# Patient Record
Sex: Female | Born: 1950 | Race: White | Hispanic: No | Marital: Married | Smoking: Never smoker
Health system: Southern US, Community
[De-identification: ages and names within clinical notes are randomized; demographics above are authoritative.]

## PROBLEM LIST (undated history)

## (undated) DIAGNOSIS — R7303 Prediabetes: Secondary | ICD-10-CM

## (undated) DIAGNOSIS — E669 Obesity, unspecified: Secondary | ICD-10-CM

## (undated) DIAGNOSIS — E785 Hyperlipidemia, unspecified: Secondary | ICD-10-CM

## (undated) DIAGNOSIS — F418 Other specified anxiety disorders: Secondary | ICD-10-CM

## (undated) DIAGNOSIS — R251 Tremor, unspecified: Secondary | ICD-10-CM

## (undated) DIAGNOSIS — D649 Anemia, unspecified: Secondary | ICD-10-CM

## (undated) DIAGNOSIS — K439 Ventral hernia without obstruction or gangrene: Secondary | ICD-10-CM

## (undated) DIAGNOSIS — E119 Type 2 diabetes mellitus without complications: Secondary | ICD-10-CM

## (undated) DIAGNOSIS — K219 Gastro-esophageal reflux disease without esophagitis: Secondary | ICD-10-CM

## (undated) DIAGNOSIS — E559 Vitamin D deficiency, unspecified: Secondary | ICD-10-CM

## (undated) DIAGNOSIS — E039 Hypothyroidism, unspecified: Secondary | ICD-10-CM

## (undated) DIAGNOSIS — M1990 Unspecified osteoarthritis, unspecified site: Secondary | ICD-10-CM

## (undated) DIAGNOSIS — R160 Hepatomegaly, not elsewhere classified: Secondary | ICD-10-CM

## (undated) DIAGNOSIS — K5790 Diverticulosis of intestine, part unspecified, without perforation or abscess without bleeding: Secondary | ICD-10-CM

## (undated) DIAGNOSIS — J398 Other specified diseases of upper respiratory tract: Secondary | ICD-10-CM

## (undated) DIAGNOSIS — I679 Cerebrovascular disease, unspecified: Secondary | ICD-10-CM

## (undated) DIAGNOSIS — R32 Unspecified urinary incontinence: Secondary | ICD-10-CM

## (undated) DIAGNOSIS — D131 Benign neoplasm of stomach: Secondary | ICD-10-CM

## (undated) DIAGNOSIS — I10 Essential (primary) hypertension: Secondary | ICD-10-CM

## (undated) DIAGNOSIS — K449 Diaphragmatic hernia without obstruction or gangrene: Secondary | ICD-10-CM

## (undated) DIAGNOSIS — K2270 Barrett's esophagus without dysplasia: Secondary | ICD-10-CM

## (undated) HISTORY — DX: Hyperlipidemia, unspecified: E78.5

## (undated) HISTORY — DX: Diaphragmatic hernia without obstruction or gangrene: K44.9

## (undated) HISTORY — DX: Other specified diseases of upper respiratory tract: J39.8

## (undated) HISTORY — DX: Tremor, unspecified: R25.1

## (undated) HISTORY — DX: Diverticulosis of intestine, part unspecified, without perforation or abscess without bleeding: K57.90

## (undated) HISTORY — DX: Hepatomegaly, not elsewhere classified: R16.0

## (undated) HISTORY — DX: Gastro-esophageal reflux disease without esophagitis: K21.9

## (undated) HISTORY — PX: ESOPHAGOGASTRODUODENOSCOPY: SHX1529

## (undated) HISTORY — PX: APPENDECTOMY: SHX54

## (undated) HISTORY — DX: Type 2 diabetes mellitus without complications: E11.9

## (undated) HISTORY — DX: Ventral hernia without obstruction or gangrene: K43.9

## (undated) HISTORY — DX: Unspecified urinary incontinence: R32

## (undated) HISTORY — PX: ABDOMINAL HYSTERECTOMY: SHX81

## (undated) HISTORY — PX: LAPAROSCOPIC GASTRIC BANDING: SHX1100

## (undated) HISTORY — PX: BREAST REDUCTION SURGERY: SHX8

## (undated) HISTORY — DX: Obesity, unspecified: E66.9

## (undated) HISTORY — PX: REDUCTION MAMMAPLASTY: SUR839

## (undated) HISTORY — DX: Anemia, unspecified: D64.9

## (undated) HISTORY — DX: Other specified anxiety disorders: F41.8

## (undated) HISTORY — PX: TRACHEAL STENOSIS REPAIR W/ PERFUSION AND MLB: SHX2553

## (undated) HISTORY — PX: GALLBLADDER SURGERY: SHX652

## (undated) HISTORY — DX: Vitamin D deficiency, unspecified: E55.9

## (undated) HISTORY — DX: Unspecified osteoarthritis, unspecified site: M19.90

## (undated) HISTORY — DX: Cerebrovascular disease, unspecified: I67.9

## (undated) HISTORY — DX: Essential (primary) hypertension: I10

## (undated) HISTORY — DX: Benign neoplasm of stomach: D13.1

## (undated) HISTORY — PX: ABDOMINAL WALL MESH  REMOVAL: SHX1116

## (undated) HISTORY — DX: Hypothyroidism, unspecified: E03.9

## (undated) HISTORY — DX: Prediabetes: R73.03

## (undated) HISTORY — PX: COLONOSCOPY: SHX174

---
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
## 1998-06-01 ENCOUNTER — Other Ambulatory Visit: Admission: RE | Admit: 1998-06-01 | Discharge: 1998-06-01 | Payer: Self-pay | Admitting: Obstetrics & Gynecology

## 1999-01-21 ENCOUNTER — Encounter: Payer: Self-pay | Admitting: Emergency Medicine

## 1999-01-21 ENCOUNTER — Emergency Department (HOSPITAL_COMMUNITY): Admission: EM | Admit: 1999-01-21 | Discharge: 1999-01-21 | Payer: Self-pay | Admitting: Internal Medicine

## 2000-12-18 ENCOUNTER — Encounter: Admission: RE | Admit: 2000-12-18 | Discharge: 2000-12-18 | Payer: Self-pay | Admitting: Internal Medicine

## 2000-12-18 ENCOUNTER — Encounter: Payer: Self-pay | Admitting: Internal Medicine

## 2001-07-09 ENCOUNTER — Encounter: Payer: Self-pay | Admitting: Internal Medicine

## 2001-07-09 ENCOUNTER — Encounter: Admission: RE | Admit: 2001-07-09 | Discharge: 2001-07-09 | Payer: Self-pay | Admitting: Internal Medicine

## 2001-08-03 ENCOUNTER — Ambulatory Visit (HOSPITAL_BASED_OUTPATIENT_CLINIC_OR_DEPARTMENT_OTHER): Admission: RE | Admit: 2001-08-03 | Discharge: 2001-08-03 | Payer: Self-pay | Admitting: Orthopaedic Surgery

## 2004-03-28 ENCOUNTER — Ambulatory Visit (HOSPITAL_COMMUNITY): Admission: RE | Admit: 2004-03-28 | Discharge: 2004-03-28 | Payer: Self-pay | Admitting: Internal Medicine

## 2005-03-24 ENCOUNTER — Ambulatory Visit (HOSPITAL_COMMUNITY): Admission: RE | Admit: 2005-03-24 | Discharge: 2005-03-24 | Payer: Self-pay | Admitting: Surgery

## 2005-04-24 ENCOUNTER — Encounter: Admission: RE | Admit: 2005-04-24 | Discharge: 2005-07-23 | Payer: Self-pay | Admitting: Surgery

## 2005-04-24 ENCOUNTER — Ambulatory Visit (HOSPITAL_COMMUNITY): Admission: RE | Admit: 2005-04-24 | Discharge: 2005-04-24 | Payer: Self-pay | Admitting: Surgery

## 2005-08-21 ENCOUNTER — Encounter: Admission: RE | Admit: 2005-08-21 | Discharge: 2005-11-19 | Payer: Self-pay | Admitting: Surgery

## 2005-09-09 ENCOUNTER — Encounter (INDEPENDENT_AMBULATORY_CARE_PROVIDER_SITE_OTHER): Payer: Self-pay | Admitting: *Deleted

## 2005-09-09 ENCOUNTER — Inpatient Hospital Stay (HOSPITAL_COMMUNITY): Admission: RE | Admit: 2005-09-09 | Discharge: 2005-09-11 | Payer: Self-pay | Admitting: Surgery

## 2005-09-11 ENCOUNTER — Encounter: Payer: Self-pay | Admitting: Vascular Surgery

## 2005-09-13 ENCOUNTER — Inpatient Hospital Stay (HOSPITAL_COMMUNITY): Admission: EM | Admit: 2005-09-13 | Discharge: 2005-09-19 | Payer: Self-pay | Admitting: Emergency Medicine

## 2005-09-15 ENCOUNTER — Encounter (INDEPENDENT_AMBULATORY_CARE_PROVIDER_SITE_OTHER): Payer: Self-pay | Admitting: *Deleted

## 2005-09-21 ENCOUNTER — Inpatient Hospital Stay (HOSPITAL_COMMUNITY): Admission: EM | Admit: 2005-09-21 | Discharge: 2005-12-12 | Payer: Self-pay | Admitting: Emergency Medicine

## 2005-09-21 ENCOUNTER — Ambulatory Visit: Payer: Self-pay | Admitting: Pulmonary Disease

## 2005-09-22 ENCOUNTER — Ambulatory Visit: Payer: Self-pay | Admitting: Infectious Diseases

## 2005-10-27 ENCOUNTER — Encounter: Payer: Self-pay | Admitting: Vascular Surgery

## 2005-12-24 ENCOUNTER — Ambulatory Visit (HOSPITAL_COMMUNITY): Admission: RE | Admit: 2005-12-24 | Discharge: 2005-12-24 | Payer: Self-pay | Admitting: Otolaryngology

## 2006-01-22 ENCOUNTER — Ambulatory Visit (HOSPITAL_COMMUNITY): Admission: RE | Admit: 2006-01-22 | Discharge: 2006-01-22 | Payer: Self-pay | Admitting: Otolaryngology

## 2006-01-26 ENCOUNTER — Ambulatory Visit (HOSPITAL_COMMUNITY): Admission: RE | Admit: 2006-01-26 | Discharge: 2006-01-26 | Payer: Self-pay | Admitting: Otolaryngology

## 2006-02-27 ENCOUNTER — Ambulatory Visit (HOSPITAL_COMMUNITY): Admission: RE | Admit: 2006-02-27 | Discharge: 2006-02-27 | Payer: Self-pay | Admitting: Otolaryngology

## 2006-04-10 ENCOUNTER — Ambulatory Visit (HOSPITAL_COMMUNITY): Admission: RE | Admit: 2006-04-10 | Discharge: 2006-04-10 | Payer: Self-pay | Admitting: Otolaryngology

## 2006-05-22 ENCOUNTER — Ambulatory Visit (HOSPITAL_COMMUNITY): Admission: RE | Admit: 2006-05-22 | Discharge: 2006-05-22 | Payer: Self-pay | Admitting: Surgery

## 2006-06-19 ENCOUNTER — Ambulatory Visit (HOSPITAL_COMMUNITY): Admission: RE | Admit: 2006-06-19 | Discharge: 2006-06-19 | Payer: Self-pay | Admitting: Otolaryngology

## 2006-08-17 ENCOUNTER — Ambulatory Visit (HOSPITAL_COMMUNITY): Admission: RE | Admit: 2006-08-17 | Discharge: 2006-08-17 | Payer: Self-pay | Admitting: Otolaryngology

## 2006-08-17 ENCOUNTER — Encounter (INDEPENDENT_AMBULATORY_CARE_PROVIDER_SITE_OTHER): Payer: Self-pay | Admitting: Otolaryngology

## 2006-10-02 ENCOUNTER — Ambulatory Visit (HOSPITAL_COMMUNITY): Admission: RE | Admit: 2006-10-02 | Discharge: 2006-10-02 | Payer: Self-pay | Admitting: Otolaryngology

## 2006-12-04 ENCOUNTER — Ambulatory Visit (HOSPITAL_COMMUNITY): Admission: RE | Admit: 2006-12-04 | Discharge: 2006-12-04 | Payer: Self-pay | Admitting: Surgery

## 2007-11-16 ENCOUNTER — Ambulatory Visit (HOSPITAL_COMMUNITY): Admission: RE | Admit: 2007-11-16 | Discharge: 2007-11-16 | Payer: Self-pay | Admitting: Otolaryngology

## 2008-04-04 ENCOUNTER — Ambulatory Visit (HOSPITAL_COMMUNITY): Admission: RE | Admit: 2008-04-04 | Discharge: 2008-04-04 | Payer: Self-pay | Admitting: Internal Medicine

## 2008-04-24 ENCOUNTER — Ambulatory Visit: Payer: Self-pay | Admitting: Gastroenterology

## 2008-05-02 IMAGING — CR DG ABD PORTABLE 1V
2 series · 2 of 2 positions shown · non-contrast
Comparison: none

CLINICAL DATA: Abscess of the anterior abdominal wall.  
 PORTABLE ABDOMEN ? 1 VIEW:

[view not recorded (1 of 2)]
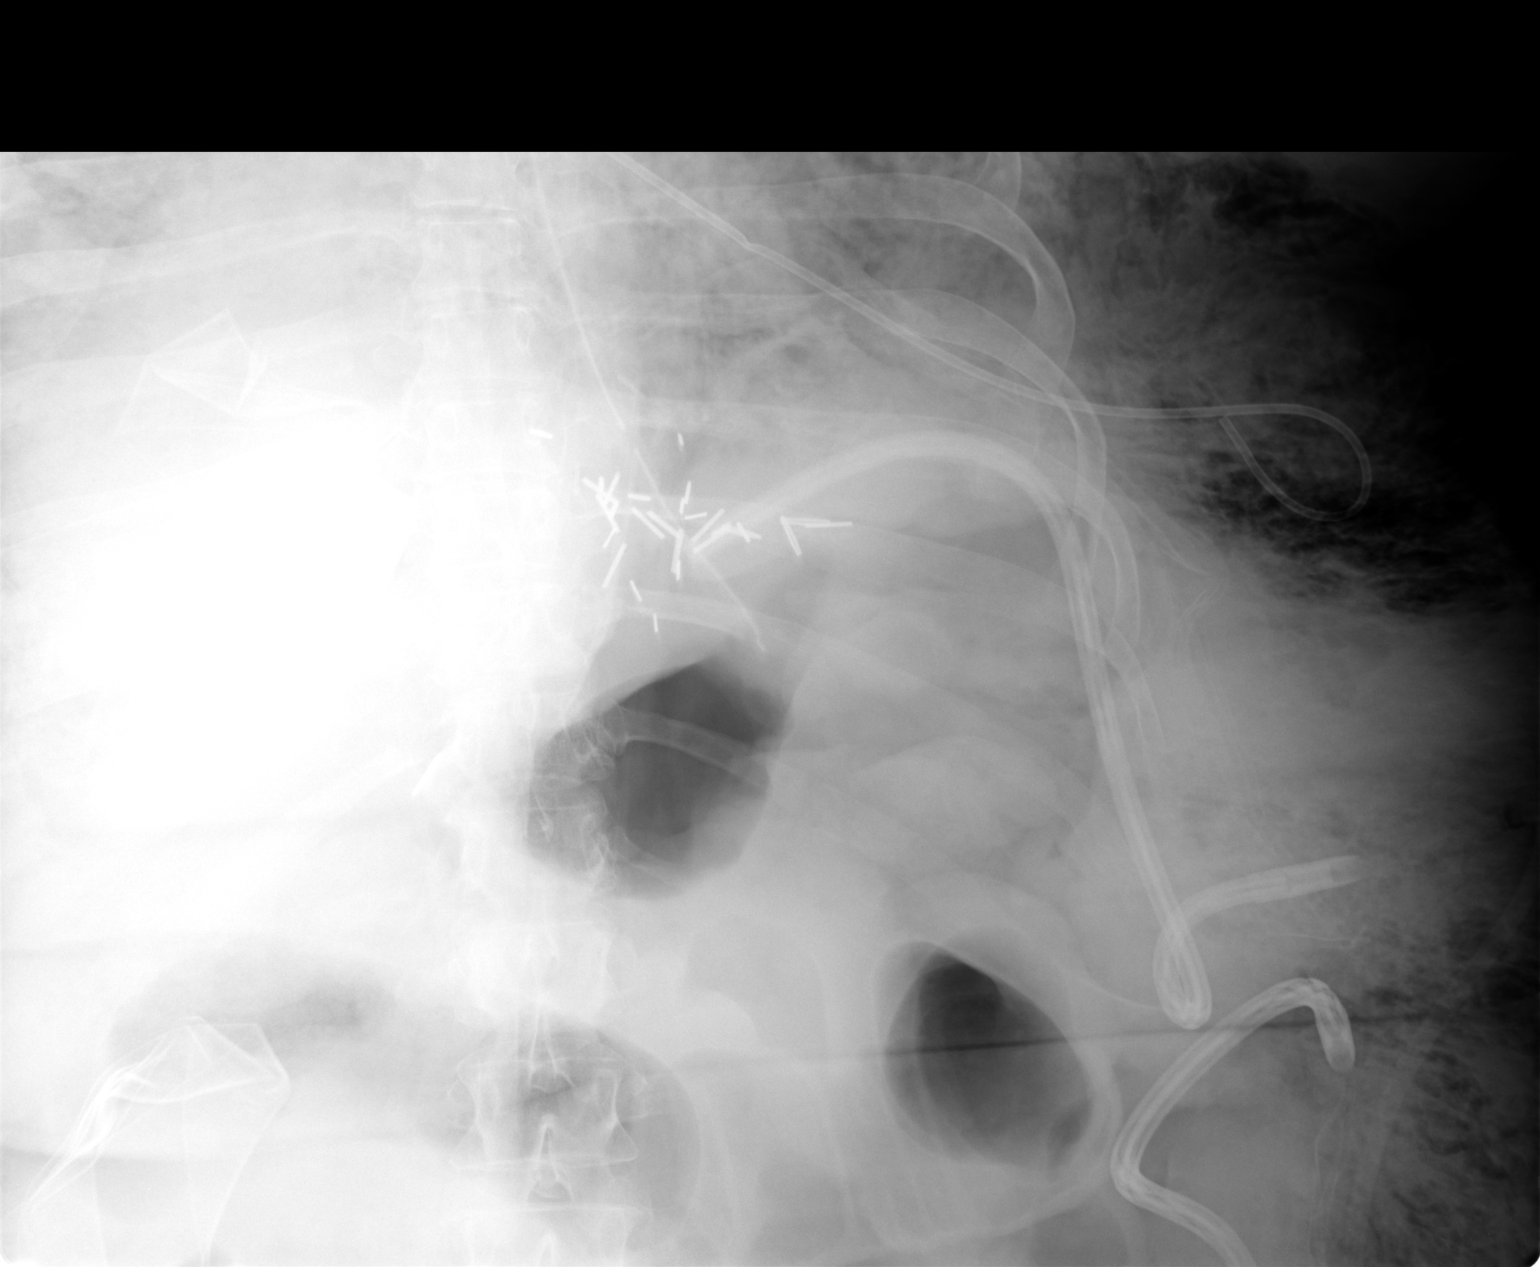

[view not recorded (2 of 2)]
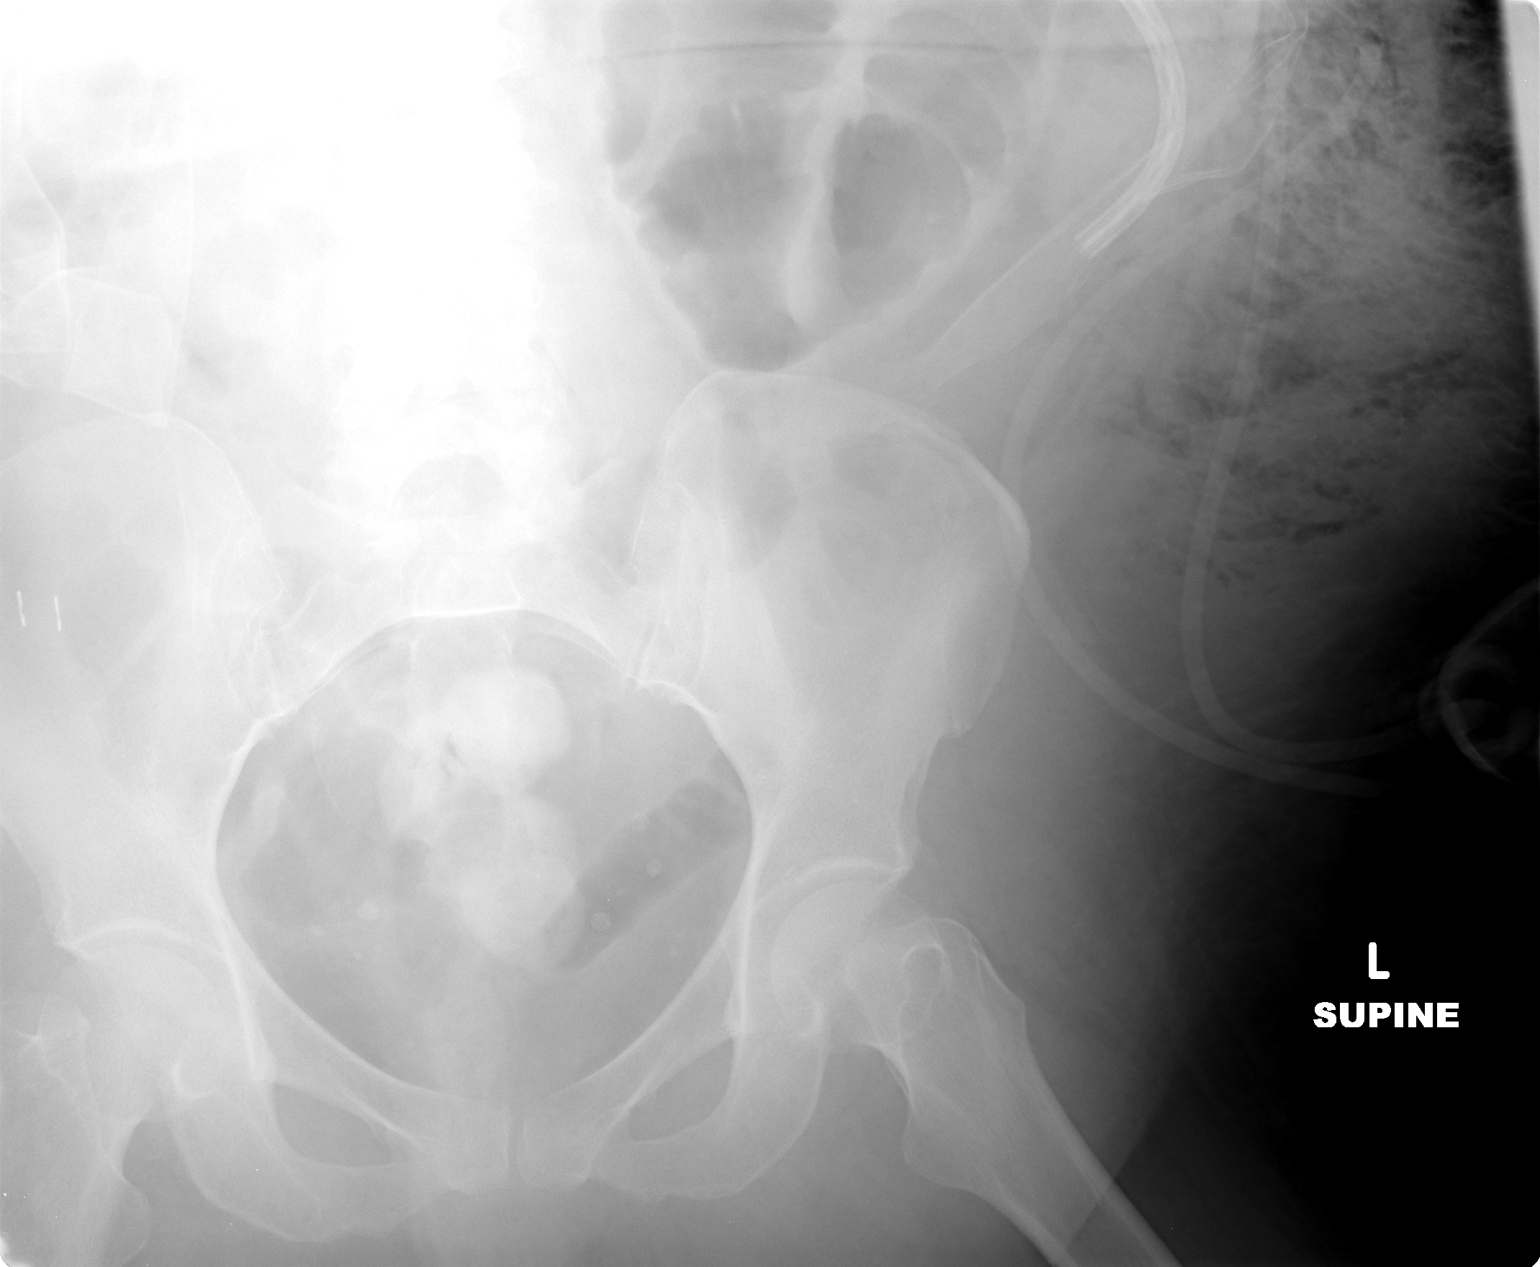

[2 of 2 positions shown; findings below may reference images not displayed]

FINDINGS: Soft tissue drains are seen in the left side of the abdomen.  NG tube tip is barely in the fundus of the stomach.  Side hole is in the distal esophagus. 
 The gas in the soft tissues obscures the detail at the lung bases but the CT scan of the same date showed some slight atelectasis at the right base that appears slightly more prominent.  There is a slight kink in one of the small catheters overlying the chest.
 There is a single slightly distended loop of bowel in the midabdomen on the left.
IMPRESSION: Soft tissue drains in place.  No change in the bowel pattern since the prior study.  Slight increased atelectasis at the right base.

## 2008-05-02 IMAGING — CR DG CHEST 2V
2 series · 2 of 2 positions shown · non-contrast
Comparison: 09/09/2005.

CLINICAL DATA: Status post laparoscopic gastric banding and hernia repair.  
 CHEST - 2 VIEW:

[w chest lat *]
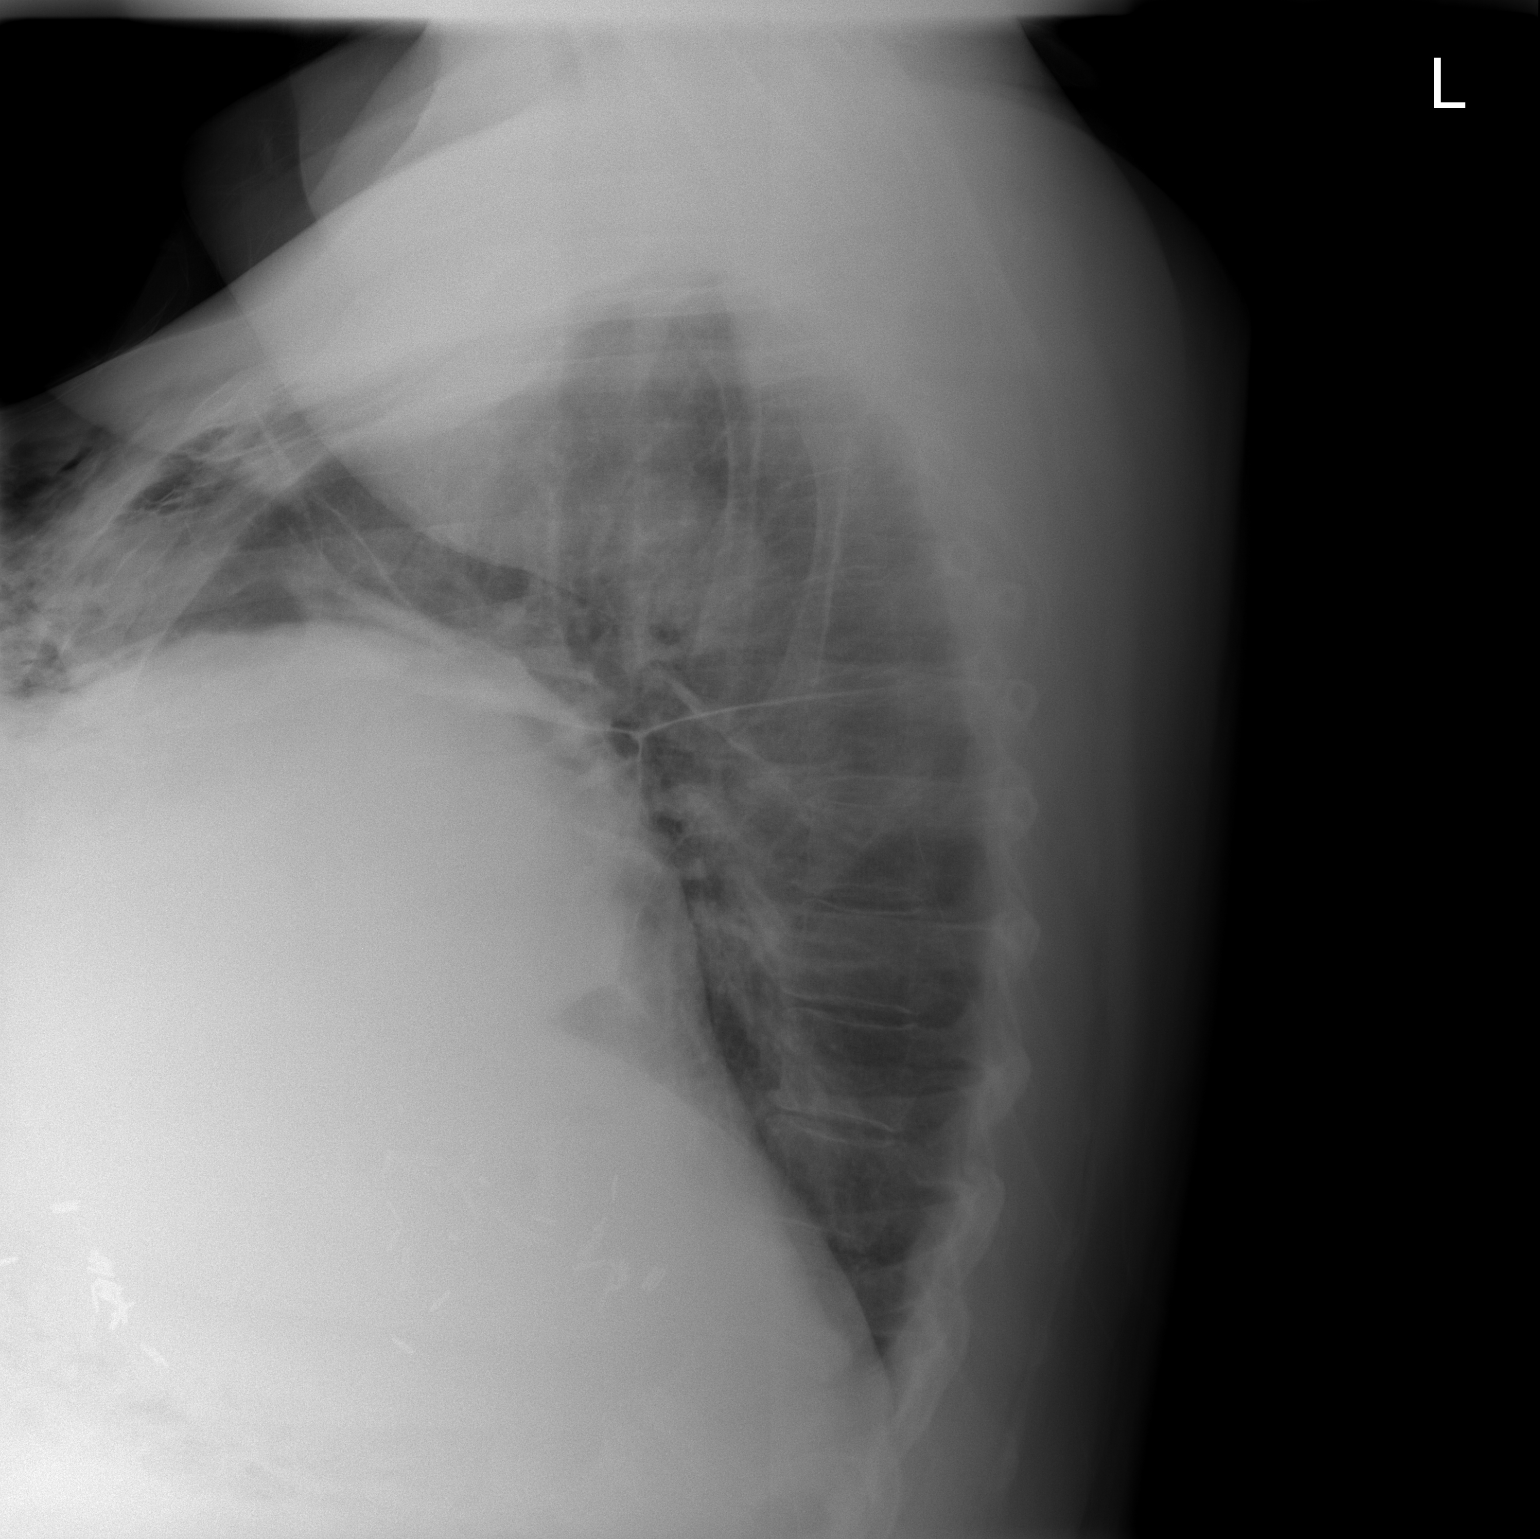

[view not recorded]
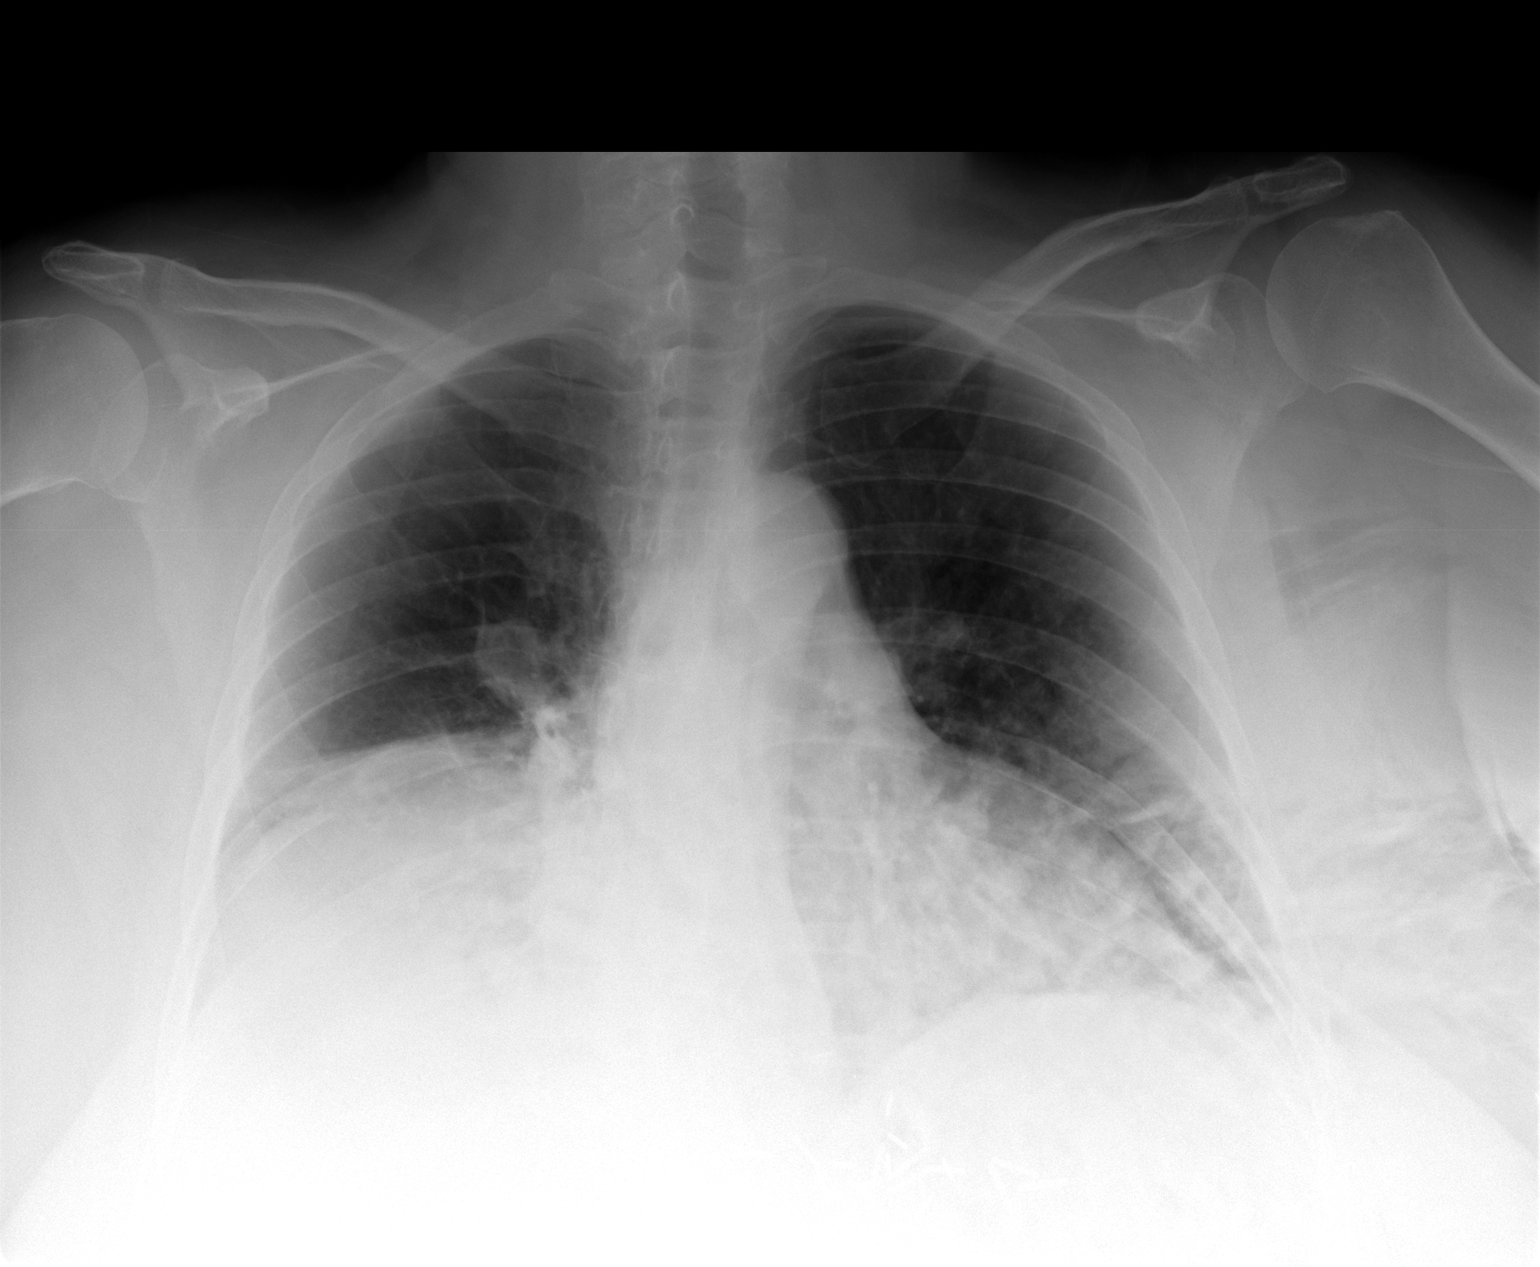

[2 of 2 positions shown; findings below may reference images not displayed]

There is new subcutaneous emphysema in the left anterior lower chest.  There is no evidence of pneumothorax.  Increased right mid and lower lung atelectasis is identified.  Remainder of the lungs are clear.  No evidence of pleural effusions.
IMPRESSION: 1.   New anterior left lower chest subcutaneous emphysema ? question related to recent surgery.  Consider further evaluation with CT.  
 2.  Increased right basilar atelectasis.

## 2008-05-02 IMAGING — CR DG CHEST 1V PORT
1 series · 1 of 1 positions shown · non-contrast
Comparison: Earlier in the day at 1141 hours.

CLINICAL DATA: Line placement. 
 PORTABLE CHEST ? 1 VIEW:

[view not recorded]
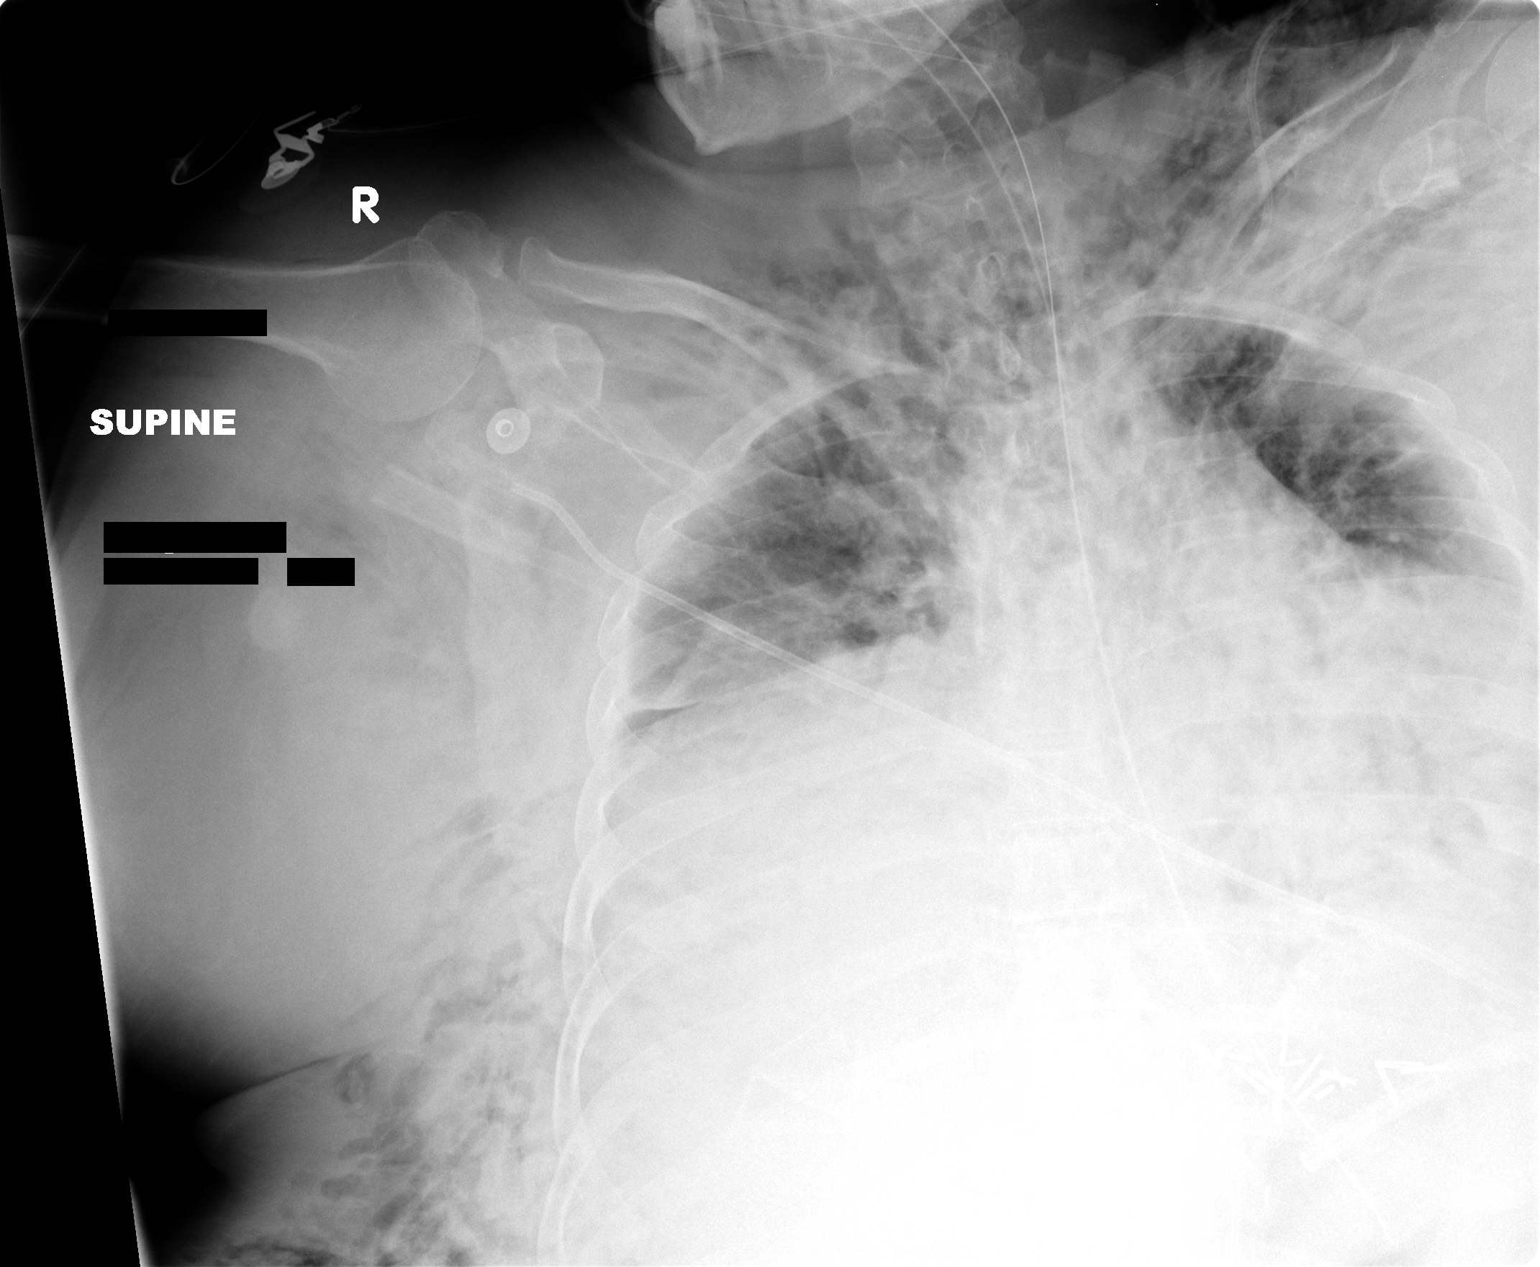

[1 of 1 positions shown; findings below may reference images not displayed]

FINDINGS: The lateral quarter of the left hemithorax is excluded from the film.  There is a left subclavian line, which terminates over the lower SVC just above the superior caval/atrial junction.   There are postsurgical changes about the GE junction.  The heart remains markedly enlarged with markedly diminished lung volumes.  Increased right base atelectasis.  There has been marked increase in subcutaneous air, now throughout the neck and right chest.  There is no definite pneumothorax.  Note that the left costophrenic angle is partially excluded.
IMPRESSION: 1.  The left costophrenic angle is partially excluded from the film. 
 2.  Left-sided subclavian line terminates over the low superior vena cava without definite pneumothorax. 
 3.  Marked progression of subcutaneous emphysema without definit ecause identified. 
 4.  Markedly diminished lung volumes with increased bibasilar atelectasis.

## 2008-05-03 IMAGING — CR DG CHEST 1V PORT
1 series · 1 of 1 positions shown · non-contrast
Comparison: 09/21/05.

CLINICAL DATA: Abscess abdominal wall.  Ventilator.
PORTABLE CHEST - 1 VIEW ? 09/22/05 ? 9959 HOURS:

[view not recorded]
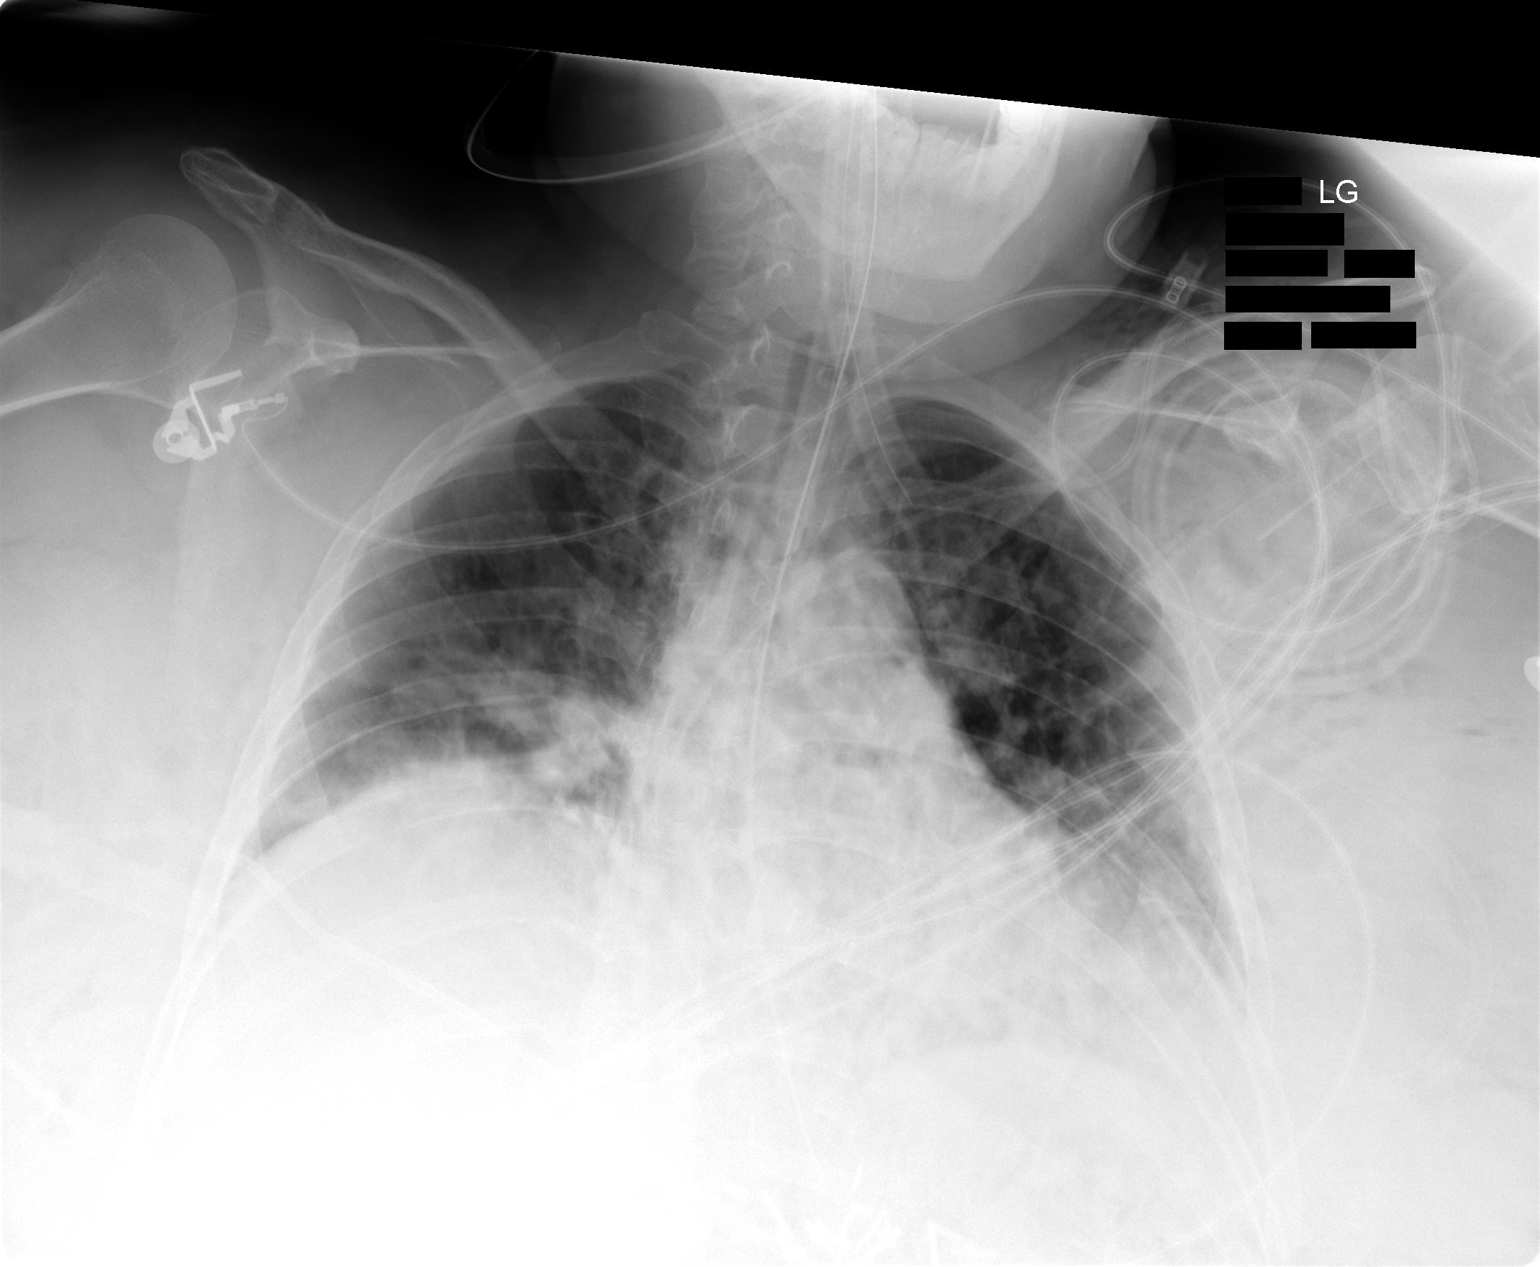

[1 of 1 positions shown; findings below may reference images not displayed]

FINDINGS: Bilateral patchy lung densities predominantly involving the left lung and right base are again noted with possibly some minimal improvement.  Marked decrease in degree of subcutaneous emphysema.  Left subclavian central venous catheter tip is in the SVC.  NG tube is noted traversing the esophagus in the proximal stomach.  Endotracheal tube is in satisfactory position.
IMPRESSION: Bilateral pulmonary densities persist.  Minimal improved aeration.  Improvement in chest wall subcutaneous emphysema.

## 2008-05-10 ENCOUNTER — Telehealth: Payer: Self-pay | Admitting: Gastroenterology

## 2008-05-15 DIAGNOSIS — Q349 Congenital malformation of respiratory system, unspecified: Secondary | ICD-10-CM

## 2008-05-15 DIAGNOSIS — I10 Essential (primary) hypertension: Secondary | ICD-10-CM | POA: Insufficient documentation

## 2008-05-15 DIAGNOSIS — Z8719 Personal history of other diseases of the digestive system: Secondary | ICD-10-CM | POA: Insufficient documentation

## 2008-05-15 DIAGNOSIS — K219 Gastro-esophageal reflux disease without esophagitis: Secondary | ICD-10-CM | POA: Insufficient documentation

## 2008-05-15 DIAGNOSIS — Q318 Other congenital malformations of larynx: Secondary | ICD-10-CM | POA: Insufficient documentation

## 2008-10-25 ENCOUNTER — Emergency Department (HOSPITAL_COMMUNITY): Admission: EM | Admit: 2008-10-25 | Discharge: 2008-10-25 | Payer: Self-pay | Admitting: Emergency Medicine

## 2009-05-09 ENCOUNTER — Encounter: Admission: RE | Admit: 2009-05-09 | Discharge: 2009-05-09 | Payer: Self-pay | Admitting: Internal Medicine

## 2010-03-31 ENCOUNTER — Encounter: Payer: Self-pay | Admitting: Surgery

## 2010-04-05 ENCOUNTER — Other Ambulatory Visit: Payer: Self-pay | Admitting: Internal Medicine

## 2010-04-05 DIAGNOSIS — Z1239 Encounter for other screening for malignant neoplasm of breast: Secondary | ICD-10-CM

## 2010-05-13 ENCOUNTER — Ambulatory Visit
Admission: RE | Admit: 2010-05-13 | Discharge: 2010-05-13 | Disposition: A | Discharge status: Home / Self Care | Payer: Medicare Other | Source: Ambulatory Visit | Attending: Internal Medicine | Admitting: Internal Medicine

## 2010-05-13 DIAGNOSIS — Z1239 Encounter for other screening for malignant neoplasm of breast: Secondary | ICD-10-CM

## 2010-07-23 NOTE — Op Note (Signed)
NAMETESSLYN, BAUMERT                ACCOUNT NO.:  [id number]   MEDICAL RECORD NO.:  [id number]          PATIENT TYPE:  AMB   LOCATION:  SDS                          FACILITY:  [facility]   PHYSICIAN:  Antony Contras, MD     DATE OF BIRTH:  15-Mar-1950   DATE OF PROCEDURE:  10/02/2006  DATE OF DISCHARGE:  10/02/2006                               OPERATIVE REPORT   This 5 mCi 60 bpm detailed operative note on [name] hand the recover  [id number]   DATE OF BIRTH:   PREOPERATIVE DIAGNOSIS:  Tracheal stenosis.   POSTOP DIAGNOSIS:  Tracheal stenosis.   PROCEDURE:  Suspended micro direct laryngoscopy with CO2 laser dilation.   SURGEON:  Antony Contras, MD   ANESTHESIA:  General with jet Venturi ventilation.   COMPLICATIONS:  None.   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  She has undergone multiple dilations, the last being on June 9.  She is  planning to undergo abdominal hernia surgery on August 8 and presents to  the operating room, today, for preoperative dilation.   FINDINGS:  The patient was found to have a 25% stenosis at the previous  tracheostomy site.  The stenosis was dilated to a #42 dilator.   DESCRIPTION OF PROCEDURE:  The patient was identified in the holding  room; and with an informed consent having been obtained, the patient was  moved to the operating suite and put on the operating table in supine  position.  Anesthesia was induced; and the was maintained via mask  ventilation.  The patient was given intravenous steroids during the  case.  The eyes were taped closed; and the bed was turned 90 degrees  from anesthesia.  A tooth guard was placed; and the larynx was then  exposed using a large Jako laryngoscope.  This was placed in suspension  on the Mayo stand.  Then a 0-degree telescope was used to document the  stenosis.  The operating microscope was brought into view  and the CO2  laser on a setting of 4 watts was used to make a single radial cut at 7  o'clock.   The stenosis was then serially dilated from a 30 to a 42 Jackson  dilator.  After this, an LCA was inserted and sprayed.  The larynx was  suctioned and the laryngoscope was removed.  During laryngoscopy, the  patient was ventilated with a jet Venturi ventilator.  The laryngoscope  was taken out of suspension and removed from the patient's mouth.  The  tooth guard was removed; and the patient was turned back to Anesthesia  for wake-up.   Upon removing the laryngoscope, a photograph was made of the  postoperative stenosis.  The patient was then awoken and moved to the  recovery room in stable condition.      Antony Contras, MD  Electronically Signed     DDB/MEDQ  D:  10/02/2006  T:  10/02/2006  Job:  [id number]

## 2010-07-23 NOTE — Op Note (Signed)
[name], [name]                ACCOUNT NO.:  [id number]   MEDICAL RECORD NO.:  [id number]          PATIENT TYPE:  AMB   LOCATION:  SDS                          FACILITY:  [facility]   PHYSICIAN:  Antony Contras, MD     DATE OF BIRTH:  1950-10-29   DATE OF PROCEDURE:  08/17/2006  DATE OF DISCHARGE:                               OPERATIVE REPORT   PREOPERATIVE DIAGNOSIS:  Tracheal stenosis.   POSTOPERATIVE DIAGNOSIS:  Tracheal stenosis.   PROCEDURE:  Suspended micro direct laryngoscopy with subsequent carbon  dioxide laser dilation of the trachea and Kenalog injection.   SURGEON:  Antony Contras, MD   ANESTHESIA:  General endotracheal anesthesia.   COMPLICATIONS:  None.   INDICATIONS:  The patient is a 60 year old white female who has acquired  tracheal stenosis following complications from an abdominal surgery last  summer.  She has undergone multiple dilations in order to maintain  airway patency, the last being April 11.  She has had increasing  symptoms of airway obstruction and presents to the operating room for  surgical management.   FINDINGS:  The trachea is narrowed at the previous tracheostomy level to  approximately a 40% stenosis.  In addition, there are 2 granulomas at  that level with the larger one on the left side that obstructs the  airway 50% more than that.  The patient was dilated up to a #42 dilator.   DESCRIPTION OF PROCEDURE:  The patient was identified in the holding  room and informed consent having been obtained, the patient was moved to  the operative suite and put on the operating table in supine position.  Anesthesia was induced.  The patient was maintained via mask ventilation  and the bed was turned 90 degrees.  The patient was given intravenous  steroids during the case.  The eyes were taped closed and a tooth guard  was placed.  The larynx was then exposed using the largest Storz  laryngoscope.  The laryngoscope was placed in the supraglottic  position  and was then suspended on the Mayo stand with a Lewy arm.  A 0-degree  telescope was used to take a preoperative photograph.  Eye pads and a  damp towel placed for laser protection.  Jet Venturi ventilation was  initiated.  An epinephrine-soaked pledget was then held against the  stenosis while ventilation was being held.  The pledget was then removed  and jet ventilation resumed.  The granulomas were then removed using  upbiting cup forceps holding ventilation.  These were sent to nursing  for pathology.  Epinephrine was again held against the site and the  airway suctioned.  Jet ventilation was resumed.  The stenosis was then  injected with 0.4 mL of Kenalog 40 using a laryngeal needle and  injecting in 4 different sites.  The airway was suctioned and jet  ventilation resumed.  Radial cuts were then made at 12 o'clock, 4  o'clock, and 8 o'clock on the tracheal wall using the CO2 laser on a  setting of 4 watts.  An epinephrine pledget was then rubbed against  the  sites.  The trachea was then dilated starting at a #30 and serially  dilating up to a 42 without difficulty.  The airway was then suctioned  and a 0 degree telescope again used take a postoperative photograph.  An  LTA was then inserted and injected into the airway.  The  laryngoscope was then taken out of suspension and removed from the  patient's mouth, suctioning the airway.  The tooth guard was removed and  the patient was returned to anesthesia under mask ventilation.  The  patient tolerated the procedure well without difficulties and was then  moved to the recovery room in stable condition.      Antony Contras, MD  Electronically Signed     DDB/MEDQ  D:  08/17/2006  T:  08/17/2006  Job:  [id number]

## 2010-07-23 NOTE — Op Note (Signed)
[name], [name]                ACCOUNT NO.:  [id number]   MEDICAL RECORD NO.:  [id number]          PATIENT TYPE:  AMB   LOCATION:  SDS                          FACILITY:  [facility]   PHYSICIAN:  Antony Contras, MD     DATE OF BIRTH:  27-Apr-1950   DATE OF PROCEDURE:  11/16/2007  DATE OF DISCHARGE:                               OPERATIVE REPORT   PREOPERATIVE DIAGNOSIS:  Tracheal stenosis.   POSTOPERATIVE DIAGNOSIS:  Tracheal stenosis.   PROCEDURE:  Micro-direct laryngoscopy with CO2 laser dilation.   SURGEON:  Antony Contras, MD   ANESTHESIA:  General with Jet Venturi ventilation.   COMPLICATIONS:  None.   INDICATIONS:  The patient is a 60 year old white female with tracheal  stenosis, which followed a complicated course after abdominal surgery.  She has undergone multiple dilations, the last being in July 2008.  She  presents to the operative room with two months of gradually increasing  obstructive symptoms.   FINDINGS:  The patient was found to have a 25% stenosis at the previous  tracheostomy site.  This stenosis was dilated to a #42 dilator.   DESCRIPTION OF PROCEDURE:  The patient was identified in the holding  room and informed consent having been obtained from the family, the  patient was moved to operative suite and put on the operating room in  supine position.  Anesthesia was induced and the patient was maintained  via mask inhalation.  The patient was given intravenous steroids during  the case.  The eyes were taped closed and the bed was turned 90 degrees  from anesthesia.  Damp eye pads were placed over the eyes, and a tooth  guard was placed.   The larynx was then exposed using a large Parson's laryngoscope.  It was  placed in a suspension on a Mayo stand.  The Jet Venturi system was  attached, and the patient successfully Jet ventilated. A 0-degree  telescope was used to make a photograph preoperatively.  An epinephrine  pledget was held against the stenosis  while Jet was held.  The pledget  was removed, and Jet Venturi ventilation was reinitiated.  The operating  microscope was then brought into view, and the CO2 laser on the setting  of 4 watts was then used to make a single radial cut at the 7 o'clock  position.  This stenosis was then serially dilated from a 30-42 Jackson  dilator.   After this, 2% lidocaine was sprayed on to the vocal cords.  The  laryngoscope was then taken out of suspension and raised from the  patient's mouth while the airway was suctioned.  The tooth guard was  removed, and the patient was returned back to anesthesia for wake up  under mask ventilation.  During lasering, a damp towel was placed over  the face.  The patient was then awoken and moved to the recovery room in  stable condition.      Antony Contras, MD  Electronically Signed     DDB/MEDQ  D:  11/16/2007  T:  11/16/2007  Job:  [id number]

## 2010-07-26 NOTE — Op Note (Signed)
[name], [name]                ACCOUNT NO.:  [id number]   MEDICAL RECORD NO.:  [id number]          PATIENT TYPE:  INP   LOCATION:  0158                         FACILITY:  [facility]   PHYSICIAN:  Thornton Park. Daphine Deutscher, MD  DATE OF BIRTH:  1950-12-21   DATE OF PROCEDURE:  10/21/2005  DATE OF DISCHARGE:  12/12/2005                               OPERATIVE REPORT   PROCEDURE:  Change abdominal wound VAC, application of Mepitel over  standard VAC.   SURGEON:  Thornton Park. Daphine Deutscher, MD   ASSISTANT:  Alfonse Ras, MD   ANESTHESIA:  General by trache.   DESCRIPTION OF PROCEDURE:  The patient was moved to room 3 where the  previous VAC was taken off.  The pulsatile lavage used to wash abdominal  wound.  Mepitel was applied over the wound followed by standard VAC  dressing.  This was connected to suction and she was then returned back  to intensive care unit.      Thornton Park Daphine Deutscher, MD  Electronically Signed     MBM/MEDQ  D:  03/16/2006  T:  03/17/2006  Job:  [id number]

## 2010-07-26 NOTE — Op Note (Signed)
[name], [name]                ACCOUNT NO.:  [id number]   MEDICAL RECORD NO.:  [id number]           PATIENT TYPE:   LOCATION:                                 FACILITY:   PHYSICIAN:  Thornton Park. Daphine Deutscher, MD       DATE OF BIRTH:   DATE OF PROCEDURE:  09/26/2005  DATE OF DISCHARGE:                                 OPERATIVE REPORT   PROCEDURE:  Removal of Wound VAC, wound irrigation and debridement,  replacement Wound VAC.   SURGEON:  Thornton Park. Daphine Deutscher, M.D.   ASSISTANT:  Baruch Merl, M.D.  General endotracheal that is already in place.   DESCRIPTION OF PROCEDURE:  The patient was taken from the intensive care  unit and brought over.  The Iodoform dressing was removed along with the  other dressings that held the Wound VAC in place.  Minimal fibrinous exudate  was noted on the bowel loops which were exposed.  I was able to irrigate  around the perimeter which was did not have really any purulence.  There was  a little bit of necrotic material around the skin edges which I debrided  sharply.  I used the pulsatile lavage to irrigate not only the anterior  abdominal contents but the pockets on the left and right sides of the  abdomen.  With this was completed, I packed all these areas with gray sponge  material and I put a fenestrated dressing on top of the bowel and then  covered it with two elliptical pieces where I had actually removed the outer  perimeter of each piece.  This marked an improvement from three days ago in  that there has been wound contraction.  This was held in place while I  cleaned around the wound with alcohol and then affixed it with an Iodoform  drape.  I cut a little hole in the top and then put the suction device on  top and this was held with one of the Wound VAC adhesive drapes, as well.  The suction was applied and a good seal was present and good shrinkage of  the dressing occurred.  The patient seemed to tolerate the procedure well  and was taken back  to the intensive care unit.      Thornton Park Daphine Deutscher, MD  Electronically Signed     MBM/MEDQ  D:  09/26/2005  T:  09/26/2005  Job:  [id number]

## 2010-07-26 NOTE — Op Note (Signed)
[name], [name]                ACCOUNT NO.:  [id number]   MEDICAL RECORD NO.:  [id number]          PATIENT TYPE:  AMB   LOCATION:  SDS                          FACILITY:  [facility]   PHYSICIAN:  Antony Contras, MD     DATE OF BIRTH:  [birth year]   DATE OF PROCEDURE:  01/26/2006  DATE OF DISCHARGE:                               OPERATIVE REPORT   PREOPERATIVE DIAGNOSIS:  Tracheal stenosis.   POSTOP DIAGNOSIS:  Tracheal stenosis.   PROCEDURE:  1. Suspended micro direct laryngoscopy with CO2 laser subsequent      dilation.  2. Kenalog injection to tracheal stenosis.   SURGEON:  Dr. Christia Reading   ANESTHESIA:  General endotracheal anesthesia.   COMPLICATIONS:  None.   INDICATIONS:  The patient is 60 year old white female who has acquired  tracheal stenosis following a complicated course related to abdominal  surgery.  A tracheostomy was replaced when stenosis was diagnosed and  she remains tracheostomy dependent at this time.  She underwent dilation  a few weeks ago but experienced no significant improvement in ability to  make a voice around trache tube.  She comes back today for repeat  dilation.   FINDINGS:  The stenotic portion of the trachea was slightly less  stenotic today than the last visit however, she had granulation tissue  filling the trachea at the site overlying the tracheostomy tube.  After  removing the granulation, the more mature scar was dilated with radial  laser incisions and dilators.  Kenalog was injected into the scarred  region.  Her tracheostomy tube was exchanged for a fenestrated  #4 trache tube which provided excellent air leak both with the  fenestration closed and with fenestration open.   DESCRIPTION OF PROCEDURE:  The patient is identified in holding room and  informed consent having been obtained, the patient was moved to the  operating room and placed on operating table in supine position.  Anesthesia was induced and the circuit was hooked  to the tracheostomy  tube.  The eyes were taped closed and the bed was turned 90 degrees from  Anesthesia.  The patient given intravenous steroids during the case.  Pads were then taped over the eyes.  A tooth guard was placed and the  larynx was then exposed using a Storz laryngoscope that was large enough  to handle 42 dilator.  This was difficult to gain exposure with her  small mouth opening but it was ultimately placed in a glottic position.  The laryngoscope was then placed in suspension using a Lewy arm on the  patient's abdomen with towels piled on the abdomen.  An epinephrine  soaked pledget was then held against the area of stenosis for a couple  of minutes and then removed.  The granulation tissue was then removed  using cup forceps exposing the tracheostomy tube.  At this point, the  tracheostomy tube was removed and a jet Venturi system was then  initiated and jet ventilation was used.  The airway was suctioned.  A  bit of granulation still present toward the right side was then  vaporized with the CO2 laser on setting of 4 watts continuous with a  defocused beam.  Beam was then focused and used to make radial cuts at 6  o'clock, 9 o'clock, 12 o'clock, and 3 o'clock.  An epinephrine soaked  pledget was then used to wipe char away from the cuts.  Dilators were  then passed through the trachea starting at a 33 and serially dilating  up to 42.  After this, Kenalog was injected into the stenotic tissue  totaling approximately 1 mL.  The airway was then suctioned out and the  42 dilator again passed through the trachea.  At this point, the  fenestrated #4 trache tube was inserted and the jet Venturi stopped.  A  small bit of granulation was then removed at the 12 o'clock position  using the CO2 laser with a pledget blocking the tube.  This was done  after the FIO2 was dropped below 40%.  To finish this further, the  tracheostomy tube was removed and the laser further used to ablate  the  tissue.  The tracheostomy tube was replaced this time with a non  fenestrated inner cannula and the anesthesia circuit hooked back up.  The patient was then ventilated still with a leak around the tube.  A  Velcro trache tie was then placed.  Laryngoscope was then taken out of  suspension and the larynx sprayed with lidocaine.  As the laryngoscope  was being removed, suction was used to remove secretions.  The tooth  guard was removed.  The patient is turned back to Anesthesia for wake-  up.  Of note, during laser use, the wet towel was placed on the  patient's face covering it completely.  At this point, the patient  returned Anesthesia for wake-up was removed to recovery room in stable  condition.      Antony Contras, MD  Electronically Signed     DDB/MEDQ  D:  01/26/2006  T:  01/27/2006  Job:  [id number]

## 2010-07-26 NOTE — Op Note (Signed)
[name], [name]                ACCOUNT NO.:  [id number]   MEDICAL RECORD NO.:  [id number]          PATIENT TYPE:  AMB   LOCATION:  SDS                          FACILITY:  [facility]   PHYSICIAN:  Antony Contras, MD     DATE OF BIRTH:  05/26/1950   DATE OF PROCEDURE:  02/27/2006  DATE OF DISCHARGE:  02/27/2006                               OPERATIVE REPORT   PREOPERATIVE DIAGNOSIS:  Tracheal stenosis.   POSTOPERATIVE DIAGNOSIS:  Tracheal stenosis.   PROCEDURE:  Suspended micro-direct laryngoscopy with CO2 laser dilation  and Kenalog injection.   SURGEON:  Excell Seltzer. Jenne Pane, M.D.   ANESTHESIA:  General jet Venturi anesthesia.   COMPLICATIONS:  None.   INDICATIONS:  The patient is a 60 year old white female with a  complicated abdominal surgery history several months ago requiring  tracheostomy.  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  Her  tracheostomy was decannulated since her last dilation and she has had  gradual increasing in symptoms of difficulty breathing and stridor.  She  presents to the operating room for surgical management.   FINDINGS:  There is a circumferential stenosis of the trachea about at  the site of the previous tracheostomy placement that is approximately  80% stenotic.  Radial cuts were made at 4 o'clock and 9 o'clock using  the CO2 laser and serial dilation was performed up to have 42 dilator.   DESCRIPTION OF PROCEDURE:  The patient was identified in the holding  room and informed consent having been obtained, the patient was moved to  the operating suite and placed on the operating table in the supine  position.  Anesthesia was induced and the patient was maintained via  mask ventilation.  The patient was given intravenous steroids during the  case and the eyes were taped closed.  The bed was turned 90 degrees from  anesthesia under mask.  A tooth guard was placed and  the larynx was then  exposed using the largest Storz laryngoscope.  The exposure was mildly  difficult.  Once in a glottic position, the jet Venturi system was  attached and the patient was initiated on jet ventilation.  The  laryngoscope was placed in suspension using a Lewy arm on a Mayo stand.  A 0 degrees telescope was used take a preoperative photograph of the  stenosis.  An epinephrine soaked pledget was held against the stenosis  while the jet was being held for about 30 seconds.  The epinephrine was  removed.  Jet ventilation was resumed.  Kenalog was then injected into  the stenosis totaling 0.9 mL and injected into several positions around  the stenosis.  The CO2 laser was then hooked up and using an operating  microscope, radial cuts were made 4 o'clock and 9 o'clock to the wall of  the trachea.  An epinephrine soaked pledget was then rubbed against the  area to remove char.  Jet was held during this process.  Dilators were  then placed starting with the 30 and serially dilating up  to of 42  without difficulty.  The 42 dilator was a bit tight in placement.  After  this, the airway was suctioned and jet Venturi halted.  The laryngoscope  was taken out of suspension and backed up.  An LTA was then injected  into the airway for topical anesthesia.  The laryngoscope was then taken  out of the patient's mouth  suctioning secretions.  The tooth guard was removed and the patient was  returned to mask ventilation without difficulty.  The patient was turned  back to anesthesia for wake-up and moved to the recovery room in stable  condition.  Eye pads and a damp towel were placed during laser use.      Antony Contras, MD  Electronically Signed     DDB/MEDQ  D:  02/27/2006  T:  02/27/2006  Job:  [id number]

## 2010-07-26 NOTE — Discharge Summary (Signed)
NAMEELLYANA, [name]                ACCOUNT NO.:  [id number]   MEDICAL RECORD NO.:  [id number]          PATIENT TYPE:  INP   LOCATION:  0161                         FACILITY:  [facility]   PHYSICIAN:  Thornton Park. Daphine Deutscher, MD  DATE OF BIRTH:  [birth year]   DATE OF ADMISSION:  09/21/2005  DATE OF DISCHARGE:  09/19/2005                                 DISCHARGE SUMMARY   ADMITTING DIAGNOSIS:  Was abdominal pain and gastric distension, possible  bowel obstruction.   COURSE IN HOSPITAL:  [name] was recently discharged after a  laparoscopic adjustable gastric banding at which time in the dissection of  her foregut, she was found to have a giant upper abdominal midline  incisional hernia.  This was unable to be repaired at the time of her lap  banding.  She was admitted by Dr. Derrell Lolling on a Saturday, September 13, 2005.  I was  out of town at a meeting through Monday, and she was admitted, had a CT scan  which showed some dilated loops of bowel in this large hernia defect with  some scattered free air which was secondary to her previous laparoscopy.  She was placed on NG suction.  She was observed with NG tube in place.  I  returned on July 9 and saw her and reviewed her CT scan and discussed taking  her to the operating room, as I was concerned she had incarcerated ventral  hernia.  Laparoscopy and repair of multiple ventral abdominal hernias was  done.  Drains were placed into the pockets that were repaired.  The 7-10  sheath seemed stable, had a pulse rate of 88.  Foley was out.  NG tube was  left in place.  Postop day 2, NG tube was clamped and then removed.[name] Kitchen  Postop  day 4, July 13, pulse rate 96, no complaints but noted to have slightly  increased work of breathing, and her drains had been removed.  She was eager  to go home, but I wanted to check for occult PE, and CT scan of the chest  was obtained.  Bilateral lower extremity Dopplers were also done which  showed no evidence of DVT.  She had 1  episode of diarrhea.  She was  afebrile, and this was postop day #4,, and she was discharged for follow-up  the following week.   FINAL DIAGNOSIS:  Readmission for small bowel obstruction secondary to large  ventral incisional hernia.      Thornton Park Daphine Deutscher, MD  Electronically Signed     MBM/MEDQ  D:  10/21/2005  T:  10/21/2005  Job:  [id number]

## 2010-07-26 NOTE — Op Note (Signed)
NAMEWESTLYNN, FIFER                ACCOUNT NO.:  [id number]   MEDICAL RECORD NO.:  [id number]          PATIENT TYPE:  INP   LOCATION:  0161                         [facility]ACILITY:  [facility]   PHYSICIAN:  Thornton Park. Daphine Deutscher, MD  DATE OF BIRTH:  11/13/1950   DATE OF PROCEDURE:  10/06/2005  DATE OF DISCHARGE:                                 OPERATIVE REPORT   PROCEDURE:  Change of abdominal VAC dressing.   SURGEON:  Thornton Park. Daphine Deutscher, MD.   ASSISTANT:  Alfonse Ras, MD.   ANESTHESIA:  General per trache.   The patient was taken to room one, the old VAC was removed. The abdomen was  prepped with Betadine and draped sterilely.  A pulse lavage unit was used to  irrigate the bowel.  I removed the JP drain that was up and around the  stomach which it has been draining serosanguineous material for some time.  After cleaning with irrigation, the surrounding tissue was pink and viable,  minimal debridement was required.  A new abdominal VAC was then placed,  tucked beneath the fascia.  I did attempt to get the small bowel reduced  down into the pelvis where it had sort of migrated from and I placed the [facility]  and this was adhered to with an Ioban drape and with the usual VAC  adhesives. After it had been connected to the suction, I then created with 2  abdominal binders, a single binder to help reinforce the [facility] and this was  applied.  The patient then taken back to the intensive care unit room 161.      Thornton Park Daphine Deutscher, MD  Electronically Signed     MBM/MEDQ  D:  10/06/2005  T:  10/07/2005  Job:  [id number]

## 2010-07-26 NOTE — Op Note (Signed)
[name], [name]                ACCOUNT NO.:  [id number]   MEDICAL RECORD NO.:  [id number]          PATIENT TYPE:  INP   LOCATION:  0152                         FACILITY:  [facility]   PHYSICIAN:  Thornton Park. Daphine Deutscher, MD  DATE OF BIRTH:  06-08-1950   DATE OF PROCEDURE:  11/17/2005  DATE OF DISCHARGE:                                 OPERATIVE REPORT   PREOPERATIVE DIAGNOSIS:  Suppurative wound drainage from site of previous  column and mesh placement with drainage increased to the VAC.   PROCEDURE:  Is removal of VAC, removal of column and mesh.  A pulsatile  lavage irrigation of the abdominal contents which were well walled off and  inspection.  Limited debridement and replacement of abdominal wound Vac.   SURGEON:  Dr. Daphine Deutscher   ASSISTANT:  Alfonse Ras, M.D.   ANESTHESIA:  General per #6 endotracheal tube.   DESCRIPTION OF PROCEDURE:  The patient was taken back to room one on  September 10 and given general anesthesia.  The abdomen was prepped with  Betadine after removing the previous dressing and Vac.  There was  suppurative purulent material all over the graft and went ahead and opened  it up gently and ended up just removing it by removing all of the previously  placed horizontal mattress sutures of Prolene.  I did not find any tracts or  pockets anywhere and did not immediately discern any fistulae.  I irrigated  with the pulse lavage and then irrigated with double antibiotic solution.   The wound was then redressed with a vac dressing using the abdominal portion  of prefenestrated encapsulated sponge with a second sponge on top fixed with  an Ioban drape.  At the same time we also redid her abdominal wound binder  and creating a large one.  She was then taken to recovery room prior to  transfer to step-down.      Thornton Park Daphine Deutscher, MD  Electronically Signed     MBM/MEDQ  D:  11/17/2005  T:  11/18/2005  Job:  [id number]

## 2010-07-26 NOTE — Op Note (Signed)
[name], [name]                ACCOUNT NO.:  [id number]   MEDICAL RECORD NO.:  [id number]          PATIENT TYPE:  INP   LOCATION:  0158                         FACILITY:  [facility]   PHYSICIAN:  Thornton Park. Daphine Deutscher, MD  DATE OF BIRTH:  Feb 08, 1951   DATE OF PROCEDURE:  10/04/2005  DATE OF DISCHARGE:  12/12/2005                               OPERATIVE REPORT   PROCEDURE:  Change area change of abdominal wound VAC/   SURGEON:  Thornton Park. Daphine Deutscher, MD   ASSISTANT:  Alfonse Ras, MD   ANESTHESIA:  General by tracheostomy.   DESCRIPTION OF PROCEDURE:  Much like many of the other VAC changes, this  was done in the operating room.  The VAC was removed.  The wound was  irrigated with pulsatile lavage and then reattached to a VAC cutting  sponges, laying them in place and then connecting them to appropriate  suction.  Following this, the patient was taken back to the intensive  care unit.  Condition stable      Matthew B. Daphine Deutscher, MD  Electronically Signed     MBM/MEDQ  D:  03/16/2006  T:  03/17/2006  Job:  [id number]

## 2010-07-26 NOTE — Op Note (Signed)
NAMEDAYZEE, TROWER                ACCOUNT NO.:  [id number]   MEDICAL RECORD NO.:  [id number]          PATIENT TYPE:  INP   LOCATION:  0161                         FACILITY:  [facility]   PHYSICIAN:  Thornton Park. Daphine Deutscher, MD  DATE OF BIRTH:  [birth year]   DATE OF PROCEDURE:  10/24/2005  DATE OF DISCHARGE:                                 OPERATIVE REPORT   PROCEDURE:  Change wound VAC.   SURGEON:  Thornton Park. Daphine Deutscher, M.D.   ASSISTANT:  Alfonse Ras, M.D.   DESCRIPTION OF PROCEDURE:  [name] was taken back to room #11 on  Friday, October 24, 2005.  The previously-placed VAC was removed.  I  carefully studied the column and the mesh was in place.  There are a few  more buds in the left upper quadrant of this graft, although there were  certainly not enough in the way of in-growth to sustain any kind of skin  graft at this time.  The area was irrigated with pulsatile lavage and then  redressed with the standard black sponge over the Mepitel which was directly  in contact with the mesh.  In addition, I used some white foam on either  little hole on each side after applying the VAC.  It was put to 125 mmHg  suction and the patient was taken back to her room in the intensive care  unit.      Thornton Park Daphine Deutscher, MD  Electronically Signed     MBM/MEDQ  D:  10/24/2005  T:  10/24/2005  Job:  [id number]

## 2010-07-26 NOTE — Op Note (Signed)
[name], [name]                ACCOUNT NO.:  [id number]   MEDICAL RECORD NO.:  [id number]           PATIENT TYPE:   LOCATION:                                 FACILITY:   PHYSICIAN:  Antony Contras, MD     DATE OF BIRTH:  February 17, 1951   DATE OF PROCEDURE:  12/02/2005  DATE OF DISCHARGE:                               OPERATIVE REPORT   PREOPERATIVE DIAGNOSES:  1. Stridor.  2. Tracheal stenosis.   POSTOPERATIVE DIAGNOSES:  1. Stridor.  2. Tracheal stenosis.   PROCEDURE:  1. Tracheostomy.  2. Direct laryngoscopy.   SURGEON:  Dr. Christia Reading.   ANESTHESIA:  General endotracheal anesthesia.   COMPLICATIONS:  None.   INDICATIONS:  The patient is a 60 year old white female who underwent  gastric bypass surgery a couple of months ago and has had a long  complicated course including an open abdomen requiring dressing changes.  She had to be on mechanical ventilation for a couple of weeks, and  ultimately, tracheostomy was performed by Dr. Daphine Deutscher.  She has since had  been decannulated but since that time has had progressing stridor that  is worse with exertion.  Fiberoptic exam demonstrates a narrowing of the  trachea at the tracheostomy level.  Her tracheostomy wound has fully  healed at this time.  She continues to have an open abdomen at this time  and is being brought back to the operating room both for my procedure as  well as further work on the abdomen by Dr. Daphine Deutscher and Dr. Shelle Iron.   FINDINGS:  Upon direct laryngoscopy, a 75% stenosis of the trachea was  found at the tracheostomy level.  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  Upon revision tracheostomy, there was a good deal of  scarring.   DESCRIPTION OF PROCEDURE:  The patient was identified in holding room,  and informed consent having been obtained, the patient was moved to the  operative suite and put on the operating table in supine position.  Anesthesia was induced, and the patient was maintained via mask  ventilation and turned 90 degrees from anesthesia.  The eyes were taped  closed.  A tooth guard was placed, and a Miller blade laryngoscope was  inserted in the mouth and used to expose the larynx.  The laryngoscope  was placed in a subglottic position.  A 0-degree telescope was then used  to evaluate the upper airway quickly.  After this, a small endotracheal  tube was then placed.  The laryngoscope was removed, and the cuff was  inflated.  The tube was taped, and the bed was turned back to the 0-  degree position.  The anterior neck was then prepped and draped in  sterile fashion.  A horizontal incision was made at the trache site  using Bovie electrocautery.  Tissue was then dissected down to the  trachea, elevating scar tissue.  The trachea was then opened  approximately between rings 2 and 3 using a 15-blade scalpel.  A 2-0  silk suture was then placed around the ring above and below the trache  site.  Endotracheal tube cuff was then deflated and backed up to above  the trache site.  A #6 cuffed Shiley trache tube was then inserted  without difficulty.  The cuff was inflated, and the anesthesia circuit  was hooked to the tracheostomy tube.  The stay sutures were tied with 2  knots above and 1 knot below the trache site.  The trache flange was  then secured to anterior neck skin using 2-0 silk sutures in 4  quadrants.  A trache dressing and trache tie were then applied.  At this  point in time, Dr. Daphine Deutscher and Dr. Shelle Iron performed their portion of the  procedure.  For their details, please see their dictated operative  notes.      Antony Contras, MD  Electronically Signed     DDB/MEDQ  D:  01/23/2006  T:  01/23/2006  Job:  [id number]

## 2010-07-26 NOTE — Op Note (Signed)
NAMEAMONIE, WISSER                ACCOUNT NO.:  [id number]   MEDICAL RECORD NO.:  [id number]          PATIENT TYPE:  INP   LOCATION:  0161                         FACILITY:  [facility]   PHYSICIAN:  Thornton Park. Daphine Deutscher, MD  DATE OF BIRTH:  11-04-1950   DATE OF PROCEDURE:  10/31/2005  DATE OF DISCHARGE:                                 OPERATIVE REPORT   PROCEDURE:  1. Change VAC abdominal wound dressing.  2. Down size Shiley trache from 6 to 4.  3. Change Foley catheter.   SURGEON:  Thornton Park. Daphine Deutscher, M.D.   ASSISTANT:  Alfonse Ras, M.D.   DESCRIPTION OF PROCEDURE:  Ms. Strader was taken back to room 10 at [facility] and given some intravenous sedation but was not paralyzed.  The  abdominal Wound VAC dressing was taken by removing the previous one.  I used  the pulse lavage to irrigate on the column and the mesh.  There were more  buds but still not enough to sustain a skin graft.  Previously placed skin  sutures in the other two areas were moved.  The sponges had all been removed  and was replaced and then the Mepitel was placed down on the graft before  the Wound VAC was changed and anchored in place with an Ioban drape and  taken to 125 mm suction.  Next, the Shiley trache was changed over a plastic  obturator without difficulty and secured.  The nurses then changed the Foley  catheter.  The patient tolerated the procedure well and was taken to the  recovery room in satisfactory condition.      Thornton Park Daphine Deutscher, MD  Electronically Signed     MBM/MEDQ  D:  10/31/2005  T:  10/31/2005  Job:  [id number]

## 2010-07-26 NOTE — Op Note (Signed)
[name]                ACCOUNT NO.:  [id number]   MEDICAL RECORD NO.:  [id number]          PATIENT TYPE:  INP   LOCATION:  0153                         FACILITY:  [facility]   PHYSICIAN:  Thornton Park. Daphine Deutscher, MD  DATE OF BIRTH:  13-Jun-1950   DATE OF PROCEDURE:  09/21/2005  DATE OF DISCHARGE:                                 OPERATIVE REPORT   PREOPERATIVE DIAGNOSIS:  Probable subphrenic abscess with multifloral  organisms from possibly an infected hematoma.   POSTOP DIAGNOSIS:  Left subphrenic abscess with multiflora organisms (gm neg  rods, gm pos rods, gm pos cocci);  perforation at band plication suture  site;  abscess extension through port sites to involve suprafascial planes  in the abdomen and lower chest. Status post exploratory laparotomy, closure  of tiny gastrotomy involving the medial most gastricplication suture;  testing with methelyene blue thru the NG tube; pulse lavage irrigation of  the intraabdominal contents and the abscess cavities; fascial debridement of  the upper abdominal abscess and its extensions along suprafascial planes;  placement of drains; and temporary closure of the abdomen with 3 liter IV  bag sutured to the skin.   SURGEON:  Thornton Park. Daphine Deutscher, M.D.   ASSISTANT:  Alfonse Ras, M.D. and Anselm Pancoast. Zachery Dakins, M.D.   ANESTHESIA:  General endotracheal.   DRAINS:  Multiple 19 Blakes, 1 Shirley sump, and 2 penrose drains.   ESTIMATED BLOOD LOSS:  75 mL   DISPOSITION:  To the intensive care unit with critical care medicine consult  pending.   DESCRIPTION OF PROCEDURE:  [name] is a 60 year old lady who is almost  2 weeks out from a laparoscopic lap band (VG) placement done in the face of  prior and cholecystectomy, open Nissen fundoplication in early 90s.  At the  time of the original surgery she was found to have many adhesions to the  anterior abdominal wall and in fact a very large complex ventral hernia.  This was unable to be  repaired primarily at that time.  The band placement  went well and postop as well looked to be in good position.  No leak was  noted.  The patient was subsequently discharged home.  She returned to the  hospital  with a small bowel obstruction with loops of bowel in this large  multi cavernous ventral hernia. This produced  a lot of proximal gastric  distension.  On July 9, we were able to go in laparoscopically and reduce  this incarcerated bowel and repair these complex defects with allograft mesh  1 week from her initial surgery.  Following this she was decompressed seemed  to get along well and began having bowel movements and was taking a diet and  doing well.  By this past Friday she was eager to go home and then started  back on liquid diet.  She was discharged and had some complaints of diarrhea  yesterday (Saturday) and came to the ER today weaker, having some shortness  of breath but noticed yesterday that the upper portion of her incision had  gotten more swollen and  red and then upon getting out of the car in the  emergency department began discharging fluid from one of her upper trocar  sites.   She was seen in the ED promptly by Dr. Zachery Dakins, and by myself and Dr.  Colin Benton and we drained off approximately 2-3 liters of what appeared to be a  port wine colored liquid that was foul smelling and on gram stain  subsequently was shown to have gram negative rods, gram positive rods, and  gram positive cocci.  CT scan was obtained after a drain had been placed  through this port site and this shows gas dissecting in the anterior  abdominal wall and going up into the chest.  For that reason we took her  straight away to the operating room. Intubation and anesthesia were given.  Dr. Rica Mast attempted a right internal jugular 3 lumen IV without success.  Dr. Daphine Deutscher placed a left subclavian 3 lumen catheter for IV access without  difficulty.   [name]'s abdomen was prepped widely  with Betadine and draped sterilely.  I made an upper midline incision and carried this down into cavity  anteriorly with necrotic fat and some old sutures from her previous Prolene  closure in the 90s.  There was some grayish dead tissue there in the midline  and this tracked back for several centimeters on either side of the fascia.  There was foul odor present.   First order business was to enter the abdomen and carefully take down  adhesions.  In the previously area where I had done the hernia repair and  run the small bowel.  There was no leak and no enterotomy and there was no  leak in the colon or any of small bowel.  However, proximally in the foregut  we had this foul-smelling discharge and I was concerned that she had some  bleeding from spleen and had an infected hematoma.  However, upon going up  into the foregut I found after we had evacuated all the liquid, there was  now some old soilage material coming out.  Exposing the band there was about  2 mm hole in the right side most suture where I had plicated anterior  stomach over the proximal stomach and placed the band.  There were two  little holes there that were oversewn with 2-0 silk sutures.  Area was  irrigated and methylene blue was put into this area under pressure and no  leak was demonstrated.  The band in the meantime was unbuckled and removed.  After this was repaired and tested, Tisseel was placed over that area. The  area was subsequently drained with a 19 Blake drain.   No other enterotomies or any other problems were noted and we then began  debridement of the anterior abdominal wall and debrided on the left side  went up to the underneath the inframammary region up in the chest again  above the fascia, in the right side went up above on top of the chest.  We  went above the right lobe of the liver and irrigated that and then decompressed that and also down toward the right lower quadrant all way down  toward  the anterior superior iliac spine.  All these areas were opened.  We  displayed her most recent CT scan in the OR and used that to guide [place] in  this debridement.  Because of her obesity, palpation of crepitus was  difficult.  Any kind of gray colored necrotic  fatty material was debrided  with sharp dissection.  There was extensive debris was it was removed.  We  knew that because of her extensive big ventral hernia that closure would be  impossible without some form of mesh and in this infected situation this was  not possible.  We elected to create a silo using a 3 liter IV bag that was  sewn to the skin around the perimeter of the wound.  Prior to doing this  however, we put a Shirley sump drain which was the only sump drain that  [facility] had into the upper midline and up going up  coursing up into the left chest above in the inframammary region. JPs were  placed in the left side and also JP placed inside up near the closure of the  gastrotomy.  On the right side large Penrose drains were placed in these  spaces as well.  The patient was then dressed with a wound dressing and was  then taken to the intensive care unit for recovery.  She was given Tygacil,  Diflucan, and penicillin G in the operating room.  The plan is to keep her  intubated and paralyzed with assessment of her anterior abdominal wall and  infection and repeat laparotomy later as needed as we need to either debride  and/or restore the integrity of her abdominal compartment.  Operating time  approximately 3 hours.  She tolerated the anesthesia and procedure well and  was transported to the ICU for recovery.      Thornton Park Daphine Deutscher, MD  Electronically Signed     MBM/MEDQ  D:  09/21/2005  T:  09/22/2005  Job:  [id number]

## 2010-07-26 NOTE — Discharge Summary (Signed)
NAMEYARA, [name]                ACCOUNT NO.:  [id number]   MEDICAL RECORD NO.:  [id number]          PATIENT TYPE:  INP   LOCATION:  0158                         FACILITY:  [facility]   PHYSICIAN:  Thornton Park. Daphine Deutscher, MD  DATE OF BIRTH:  1951/01/21   DATE OF ADMISSION:  09/21/2005  DATE OF DISCHARGE:  12/12/2005                                 DISCHARGE SUMMARY   PREOPERATIVE DIAGNOSIS:  Necrotizing gas forming upper abdominal infection  secondary to a gastric perforation.   HOSPITAL COURSE:  [name] is a 60 year old lady who had a takedown of a  laparoscopic Nissen and banding and also with takedown of a giant abdominal  ventral hernia.  Postoperatively, she had some obstruction from her hernia  being incarcerated in small bowel.  This was managed surgically.  She went  home but came back to the ER short of breath and with foul leakage noted in  the emergency room from her upper trocar port.  In the ED, I cultured this  and it came back gram-positive rods and gram-positive cocci and gram-  negative rods.  We felt that she had a large subphrenic abscess with gas  directing out around the port sites.   She was taken to the operating room on Sunday, September 21, 2005 and an  extensive operation requiring debridement of the anterior abdominal wall,  opening of the wound, removal of the bland, closure of just a pinpoint hole  in the stomach where a suture had torn through and then she underwent  temporary closure of abdominal wall for loss of domain from this giant  abdominal wall hernia.   Her hospital course was long and could be divided into the initial phase of  resuscitation and management of this immediately life-threatening infection  which required about a week to get her under control.  This was done with  every other day wound changes and ultimately with a vac drain on her open  abdomen.  She was kept in the intensive care unit and tracheostomized early  main for the need to  continually go back to the operating room as she  fortunately did not develop ARDS.  She had critical care medicine assistance  and ID assistance to maximize her antibiotic coverage and her intensive care  treatment.   Once the first phase, which lasted about a week was over, we then began  trying to use this vac dressing to clean the wound up which we were able to  do over the remaining portion of the month.  On October 10, 2005, the wound  did get cleaned up enough so that I went in and installed a piece of  CollaMend BioMesh by [employer].  A wound vac was placed over that.  Over the next  month, we took her down to the OR every third day, changing her VAC and  examining this to try to stimulate ingrowth of her own tissue into the  biomesh.  She  was transferred out of the unit and the wound service was  changing this VAC on the floor.  Subsequently, she  developed some difficulty  with breathing and mild septic-type picture and the CollaMend appeared to be  separating from her own tissue, which we thought was growing into and thus  on November 17, 2005 I had to remove the CollaMend mesh and just place a  vac directly over now what appeared to be a granular bed.  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 I was concerned that she might have developed an  enterocutaneous fistula but apparently she had not.  She may have had some  issues with adrenal insufficiency and she was given steroids.  She was  maintained on vancomycin and Tygacil.  Throughout this course, she got TNA.   Final phase of her hospitalization required identification and management of  stridor.  Workup of stridor revealing she had a stenotic area around her  tracheostomy and this was managed by Dr. Jenne Pane; and her open wound  subsequently needed split-thickness skin grafting to her open abdomen was  performed with the assistance of Dr. Shelle Iron.  These were  performed on  December 02, 2005.  She, following this, had a bolus dressing and these  were taken down about five days after the graft, looked at and looked to be  a good take of the graft site with donor sites on her left thigh.  These  cleaned up and she progressed and was ready for discharge on hospital day  #83 which was December 12, 2005.  Her husband [name] did a great job of  supportive hands-on on care as well as emotional support throughout her  hospital stay. She was asked to return to the office in a week.  In addition  to her other home medicines, she was given prescriptions for Zofran ODT 8  and Duragesic patches as well as Ativan 1 mg b.i.d. p.r.n.      Thornton Park Daphine Deutscher, MD  Electronically Signed     MBM/MEDQ  D:  01/14/2006  T:  01/14/2006  Job:  [id number]   cc:   Lucky Cowboy, M.D.  Fax: [phone]

## 2010-07-26 NOTE — Op Note (Signed)
NAMERUTHER, EPHRAIM                ACCOUNT NO.:  [id number]   MEDICAL RECORD NO.:  [id number]          PATIENT TYPE:  INP   LOCATION:  0161                         FACILITY:  [facility]   PHYSICIAN:  Thornton Park. Daphine Deutscher, MD  DATE OF BIRTH:  May 27, 1950   DATE OF PROCEDURE:  10/28/2005  DATE OF DISCHARGE:                                 OPERATIVE REPORT   PROCEDURE PERFORMED:  Change tracheostomy from 6 Shiley to 6 Shiley and  change VAC dressing of abdomen.   SURGEON:  Thornton Park. Daphine Deutscher, MD   ASSISTANT:  Alfonse Ras, MD   DESCRIPTION OF PROCEDURE:  The patient was taken to room 1 at [facility] and she was given some intravenous sedation and some  inhalational agents.  I then changed out her trache without difficulty,  replacing the 6 Shiley with another 6 Shiley. Next, I removed her abdominal  VAC dressing.  I have been carefully studying the Collimend mesh and she had  more ingrowth particularly on the left side, but certainly not enough to  sustain a skin graft.  I irrigated it carefully and replaced the VAC.  There  is no evidence that it had pulled away from the fascia and it seemed to like  it is becoming seated.  She seemed to tolerate this well and VAC was  replaced.  She was returned to her room in intensive care, room 161 as a  step down bed.  The patient tolerated the procedure well.      Thornton Park Daphine Deutscher, MD  Electronically Signed     MBM/MEDQ  D:  10/29/2005  T:  10/29/2005  Job:  [id number]

## 2010-07-26 NOTE — Op Note (Signed)
[name], [name]                ACCOUNT NO.:  [id number]   MEDICAL RECORD NO.:  [id number]          PATIENT TYPE:  INP   LOCATION:  0161                         FACILITY:  [facility]   PHYSICIAN:  Thornton Park. Daphine Deutscher, MD  DATE OF BIRTH:  06-17-1950   DATE OF PROCEDURE:  10/15/2005  DATE OF DISCHARGE:                                 OPERATIVE REPORT   SURGEON:  Thornton Park. Daphine Deutscher, MD   ASSISTANT:  Alfonse Ras, MD   PROCEDURE:  Change of nominal WoundVac.   DESCRIPTION OF PROCEDURE:  [name] was taken from intensive care bed  1621 to room 12 on Wednesday morning, October 15, 2005.  After giving her some  ventilation agent and relaxation medications, we passed on OG tube and got  out a total of 100 cc of bilious enteric contents from her stomach.  We did  this because she did have some nausea.  This was subsequently discontinued  after we awakened her, and she will be treated with Reglan.   In the meantime, the old WoundVac was removed, and pulsatile lavage was used  to irrigate the perimeter.  There were a few areas where the mesh had sort  of buckled around the superior margin; and about 4 such places I tacked it  down to the granulation tissue with 2-0 Vicryl.  Overall it was intact.  It  seemed like it was holding to the fascia, and I could see tiny little red  buds through the pores of the mesh.  After irrigating with several liters of  saline, probably about 2 liters, I went ahead and we installed a new  abdominal WoundVac -- again, securing with Ioban and putting it to pressure.   She seemed to tolerated the procedure well and was taken to the recovery  room where she was recovered.   I went out and had a nice discussion with her husband postoperatively, about  trying to let him understand where we were in terms of the need to get the  granular bed for subsequent skin grafting.  I also listened and shared some  of his frustration over her hospital course and answered  questions.   The patient was taken to the recovery room for postoperative monitoring,  prior to transfer back to the intensive care unit.      Thornton Park Daphine Deutscher, MD  Electronically Signed     MBM/MEDQ  D:  10/15/2005  T:  10/15/2005  Job:  [id number]

## 2010-07-26 NOTE — Op Note (Signed)
[place]  Patient:    [name], [name] Visit Number: [id number] MRN: [id number]          Service Type: DSU Location: [facility] Attending Physician:  Marcene Corning Dictated by:   Lubertha Basque. Jerl Santos, M.D. Proc. Date: 08/03/01 Admit Date:  08/03/2001                             Operative Report  PREOPERATIVE DIAGNOSIS: 1. Left knee chondromalacia patella. 2. Left knee degenerative joint disease.  POSTOPERATIVE DIAGNOSIS: 1. Left knee degenerative joint disease. 2. Left knee loose bodies.  OPERATION PERFORMED: 1. Left knee chondroplasty, medial and patellofemoral. 2. Left knee removal of loose bodies.  ANESTHESIA:  General.  ATTENDING SURGEON:  Lubertha Basque. Jerl Santos, M.D.  ASSISTANT:  Lindwood Qua, P.A.  INDICATIONS FOR PROCEDURE:  The patient is a 60 year old woman with several months of left knee pain and swelling.  This persisted despite oral anti-inflammatories, cortisone injection, and physical therapy.  She is offered an arthroscopy at this point.  The procedure was discussed with the patient and informed operative consent was obtained after discussion of possible complications of reaction to anesthesia and infection.  DESCRIPTION OF PROCEDURE:  The patient was taken to an operating suite where general anesthetic was applied without difficulty.  She was then positioned supine and prepped and draped in normal sterile fashion.  After administration of preop IV clindamycin, an arthroscopy of the left knee was performed through two inferior portals.  The suprapatellar pouch was benign while the patellofemoral joint exhibited grade 3 chondromalacia of the patella.  This was addressed with a thorough chondroplasty.  The patella actually tracked fairly well.  No lateral release was required.  The medial compartment exhibited grade four change across about half the medial femoral condyle.  She had some loose flaps of articular cartilage which  were smoothed with a shaver. The meniscus itself was intact.  The notch showed about a 1 cm bony loose body which was removed through the other portal.  She had several smaller cartilaginous loose bodies in the knee which were removed and one which appeared to be tethered in the lateral gutter also was removed.  The lateral compartment showed no evidence of meniscal or articular cartilage injury.  The knee was thoroughly irrigated  followed by placement of Marcaine with epinephrine and morphine. Adaptic was placed over the portals followed by dry gauze and a loose Ace wrap.  Estimated blood loss and intraoperative fluids can be obtained from Anesthesia records.  DISPOSITION:  The patient was extubated in the operating room and taken to the recovery room in stable condition.  Plans were for her to go home the same day and to follow up in the office in less than a week.  I will contact her by phone tonight. Dictated by:   Lubertha Basque Jerl Santos, M.D. Attending Physician:  Marcene Corning DD:  08/03/01 TD:  08/03/01 Job: [id number]

## 2010-07-26 NOTE — Op Note (Signed)
NAMESHOSHANNA, MCQUITTY                ACCOUNT NO.:  [id number]   MEDICAL RECORD NO.:  [id number]          PATIENT TYPE:  INP   LOCATION:  0161                         FACILITY:  [facility]   PHYSICIAN:  Thornton Park. Daphine Deutscher, MD  DATE OF BIRTH:  01-Apr-1950   DATE OF PROCEDURE:  10/08/2005  DATE OF DISCHARGE:                                 OPERATIVE REPORT   PROCEDURE:  Change abdominal wound VAC and remove sutures from trache.   SURGEON:  Thornton Park. Daphine Deutscher, MD.   ASSISTANT:  Alfonse Ras, MD.   ANESTHESIA:  General by trache.   DESCRIPTION OF PROCEDURE:  Ms. Arca was transported to OR #1 where her  previous VAC was removed.  The bowel seemed to better contain in the  abdominal compartment having had the VAC and an abdominal binder on.  This  area was washed with pulsatile lavage.  The skin was prepped with alcohol  and then an Ioban drape kept the wound VAC in place.  I did install some  auxiliary sponge in some of the crevices, but basically this was as we had  done on several occasions.  It was applied to suction in the usual fashion.  I also removed sutures that were holding the tracheostomy in place.  The  patient was transported back from the operating room to the intensive care  unit in stable condition.      Thornton Park Daphine Deutscher, MD  Electronically Signed     MBM/MEDQ  D:  10/09/2005  T:  10/09/2005  Job:  [id number]

## 2010-07-26 NOTE — Op Note (Signed)
[name], [name]                ACCOUNT NO.:  [id number]   MEDICAL RECORD NO.:  [id number]          PATIENT TYPE:  AMB   LOCATION:  SDS                          FACILITY:  [facility]   PHYSICIAN:  Antony Contras, MD     DATE OF BIRTH:  12/09/1950   DATE OF PROCEDURE:  06/19/2006  DATE OF DISCHARGE:                               OPERATIVE REPORT   PREOPERATIVE DIAGNOSIS:  Tracheal stenosis.   POSTOPERATIVE DIAGNOSIS:  Tracheal stenosis.   PROCEDURE:  Suspended micro direct laryngoscopy with CO2 laser and  subsequent dilation.   SURGEON:  Christia Reading, MD   ANESTHESIA:  General, jet Venturi.   COMPLICATIONS:  None.   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  She has undergone multiple dilations in order to maintain airway  patency, the last being on February 1.  She has had increasing symptoms  of airway obstruction and presents to the operating room for surgical  management.   FINDINGS:  The trachea is narrowed at the previous tracheostomy level to  approximately a 60% stenosis.  The patient was dilated up to a #42  dilator.   DESCRIPTION OF PROCEDURE:  The patient was identified in the holding  room and informed consent having been obtained, the patient was moved to  the operating suite and put on the operating table in supine position.  Anesthesia was induced.  The patient was maintained via mask ventilation  and the bed was turned 90 degrees.  The patient given intravenous  steroids during the case.  The eyes were taped closed and a tooth guard  was placed.  The larynx was then exposed with the largest Storz  laryngoscope.  The laryngoscope was placed in a supraglottic position  and was then suspended on the Mayo stand with a Lewy arm.  A 0-degree  telescope was used to take a preoperative photograph.  An epinephrine-  soaked pledget was held against the stenosis while  jet Venturi was being  held.  The pledget was then removed.  Jet ventilation was resumed.  The  CO2 laser was then hooked up to the operating microscope and radial cuts  were made at 12 o'clock, 4 o'clock, and 8 o'clock on the tracheal wall.  Dilators were then placed in a serial fashion starting with a 34 and  dilating up to a 42 without difficulty.  After this, the airway was  suctioned and a postoperative photograph was made with the 0-degree  telescope.  The laryngoscope was then taken out of suspension and  removed from the patient's mouth while an LTA was injected to the airway  for  topical anesthesia.  The tooth guard was removed and the patient was  then maintained via mask ventilation without difficulty.  The patient  was then turned back to Anesthesia for wakeup.  Eye pads and a damp  towel were used over the face during laser use.  The patient was moved  to the recovery room in stable condition.  [name] Kitchen  Antony Contras, MD  Electronically Signed     DDB/MEDQ  D:  06/19/2006  T:  06/19/2006  Job:  [id number]

## 2010-07-26 NOTE — H&P (Signed)
[name], [name]                ACCOUNT NO.:  [id number]   MEDICAL RECORD NO.:  [id number]          PATIENT TYPE:  OIB   LOCATION:  0153                         FACILITY:  [facility]   PHYSICIAN:  Thornton Park. Daphine Deutscher, MD  DATE OF BIRTH:  1950-09-24   DATE OF ADMISSION:  09/09/2005  DATE OF DISCHARGE:  09/11/2005                                HISTORY & PHYSICAL   CHIEF COMPLAINT:  Morbid obesity, BMI greater than 50, height 5 feet 4-1/2.   HISTORY:  [name] is a 60 year old white female on whom I had done an  open Nissen fundoplication, I think in 1991. She had been to a seminar in  early January 2007 regarding gastric bypass and lap band and wanted a lap  band done despite her previous open procedure. We had discussions in the  office regarding the risks and benefits and takedown of her open Nissen but  mainly just to get up and expose that area and the risk of having to this  open and complications. She wanted to quit and have this done and preferred  not to have a gastric bypass. She was brought to the OR at this time for  placement of a laparoscopic adjustable gastric band possibly with vagotomy.  She is aware of the study that we have performed and the promising outcomes  and wanted to have a vagotomy along with her banding if possible.   MEDICATIONS:  Cozaar, bumetanide, folate, simvastatin, alprazolam, Nexium  and baby aspirin daily. She takes Flexeril as needed.   ALLERGIES:  PENICILLIN producing a rash, ANCEF producing a rash, TRAZODONE  producing palpitations and dizziness.   PAST SURGICAL HISTORY:  Abdominoplasty in 1976, breast reduction in 1976,  1979 partial hysterectomy, 1990 open cholecystectomy, 1999 one open Nissen  fundoplication.   MEDICAL COMORBIDITIES:  High blood pressure, GERD, hyperlipidemia,  fibromyalgia, diabetes.   FAMILY HISTORY:  Her father is dead with arteriosclerotic cardiovascular  disease, hypertension, and her mother is deceased with  arteriosclerotic  hypertension and MI.   PHYSICAL EXAMINATION:  HEENT:  Head normocephalic, eyes sclera nonicteric,  pupils equal round and reactive to light. Nose and throat exam unremarkable.  CHEST:  Clear to auscultation.  HEART:  Sinus rhythm without murmur or gallop.  ABDOMEN:  Morbidly obese with midline and upper abdominal scars as well as  lower abdominal scars from her previous surgeries as mentioned above.  EXTREMITIES:  Full range of motion.   IMPRESSION:  Morbid obesity, BMI greater than 50 for a laparoscopic  adjustable gastric banding and possible vagotomy.      Thornton Park Daphine Deutscher, MD  Electronically Signed     MBM/MEDQ  D:  10/21/2005  T:  10/21/2005  Job:  [id number]

## 2010-07-26 NOTE — Op Note (Signed)
NAMEROBECCA, [name]                ACCOUNT NO.:  [id number]   MEDICAL RECORD NO.:  [id number]          PATIENT TYPE:  INP   LOCATION:  0161                         FACILITY:  [facility]   PHYSICIAN:  Thornton Park. Daphine Deutscher, MD  DATE OF BIRTH:  24-Dec-1950   DATE OF PROCEDURE:  10/13/2005  DATE OF DISCHARGE:                                 OPERATIVE REPORT   PREOPERATIVE DIAGNOSIS:  Open abdominal wound with vac.   PROCEDURE:  Irrigation of anterior abdominal wall and change wound vac,  downsized Shiley tracheostomy from #8 to #6.   SURGEON:  Dr. Daphine Deutscher.   ASSISTANT:  Dr. Colin Benton.   DESCRIPTION OF PROCEDURE:  [name] was taken to OR 1 on October 13, 2005 and  given some intravenous sedation and then some general by her trach.  I  removed her vac which had been placed on the preceding Friday when I had  installed piece of CollaMend mesh.  I removed the vac and irrigated.  There  was a little bit of gelatinous material on the inferior portion of the mesh.  It was all intact and was bound down to the fascia.  In the central portion,  there was some pink areas where it had been fenestrated which possibly could  represent connection with the granulation tissue below.  There was no  evidence of any bowel enteral contents in the wound.  We then irrigated with  3 liters of saline and debrided the edges of the wound using a knife to get  a little bit of eschar particularly on the right side where there was a  little hole and tract from previous fascia undermining and then a little bit  from the left side.  Once I had irrigated well, I then culture the base  wound for aerobes and anaerobes, and then we cleaned out around the  parameter with alcohol and then applied the wound vac again using the  abdominal wound vac, placing it down over the mesh with 2 foam pads atop  that, held in place with Ioban and then also with the special wound vac  drapes.  The patient seemed to tolerate the procedure well.  We  then went up  top and put a stylet through the trache tube and removed the trach, removing  the Prolene sutures and then changed the trach.  We then put a #6 Shiley in  place.  We monitored O2 saturation, and she remained well oxygenated  throughout the entire portion of that procedure.  She was then taken back to  the intensive care unit for continued following.      Thornton Park Daphine Deutscher, MD  Electronically Signed     MBM/MEDQ  D:  10/13/2005  T:  10/13/2005  Job:  [id number]

## 2010-07-26 NOTE — Op Note (Signed)
[name], [name]                ACCOUNT NO.:  [id number]   MEDICAL RECORD NO.:  [id number]          PATIENT TYPE:  INP   LOCATION:  0161                         FACILITY:  [facility]   PHYSICIAN:  Thornton Park. Daphine Deutscher, MD  DATE OF BIRTH:  1950/06/04   DATE OF PROCEDURE:  10/10/2005  DATE OF DISCHARGE:                                 OPERATIVE REPORT   PREOPERATIVE DIAGNOSIS:  Open abdomen.   PROCEDURE:  Relook laparotomy with irrigation, debridement and placement of  8 x 10 sheet of CollaMend mesh by Bard.  Application of a vac dressing.   SURGEON:  Dr. Daphine Deutscher.   ASSISTANT:  Dr. Colin Benton.   ANESTHESIA:  General by trache.   DESCRIPTION OF PROCEDURE:  [name] was taken to OR Room 3 on Friday,  October 10, 2005.  The previously placed vac dressing was removed, revealing  the abdominal compartment.  This area was prepped with Betadine and draped  sterilely.  I then did my usual as I have done every time since the first  time back used the pulsatile lavage to irrigate the exposed viscera  including small bowel and transverse colon and to irrigate around the  perimeter and suck out the fluid that accumulates in the pelvis.  I did this  and removed the sponges that I tucked in the skin tracks on both sides.  Once this had been done, I prepared a piece of 8 x 10 inch CollaMend mesh.  This was soaked for approximately 5 minutes.  I used an 11 blade to create  small fenestration in this before I hydrated (pie crusted it).  Next, I went  around the perimeter of this wound and divided it.  The right upper quadrant  was the least able for me to free fashion to get a fascial edge, and since I  could not really tug mesh beneath the fascia particularly in that entire  quadrant, I elected to tack this mesh on the top of the fascia.  I came back  at least 5 mm from the edge of mesh when sutured it to the fascia, but first  I placed 17 or 18 sutures horizontally through the fascia evenly spaced  throughout the parameter of the wound.  I then used a [place] eye needle and  the needle that remained on the #1 Novofil sutures to take purchases along  the mesh at least 5 mm in from edge and tried equal the distance in the  sutures that came through the actual fascia to prevent buckling.  All the  sutures were placed, and then the mesh was brought out and laid on the bowel  and sutured down.  Once all the sutures were in place and were held with  hemostats, I then put my finger in and protected the bowel and tied down  these horizontal mattress sutures to secure the mesh to the fascia.  With  the patient paralyzed, this completely reduced and held the abdominal  contents inside.  The mesh was kept moist throughout this, and when  completed, it looked good.  There was no  bleeding.  I put a separate little  skin suture to kind of help pull the skin together on the left side.  All of  the Novofils were tied with 10 throws and cut.  Again, it was irrigated.   Next, in the fascia as we have done every time applied the abdominal wound  vac, at this time trimming the anterior portion since there was not really  anything to tuck it into behind the fascia.  Then we put mesh sponges atop  that and  then secured it with Ioban drapes and put it to suction.  It very nicely  reduced into the abdomen.  The resulting wound looked good and clean.  The  patient was then taken to recovery room in satisfactory condition prior to  transfer back to room 161 in the intensive care unit.      Thornton Park Daphine Deutscher, MD  Electronically Signed     MBM/MEDQ  D:  10/10/2005  T:  10/10/2005  Job:  [id number]

## 2010-07-26 NOTE — Op Note (Signed)
NAMECALENE, [name]                ACCOUNT NO.:  [id number]   MEDICAL RECORD NO.:  [id number]          PATIENT TYPE:  AMB   LOCATION:  SDS                          FACILITY:  [facility]   PHYSICIAN:  Antony Contras, MD     DATE OF BIRTH:  [birth year]   DATE OF PROCEDURE:  04/10/2006  DATE OF DISCHARGE:                               OPERATIVE REPORT   PREOPERATIVE DIAGNOSIS:  Tracheal stenosis.   POSTOPERATIVE DIAGNOSIS:  Tracheal stenosis.   PROCEDURE:  1. Suspended micro direct laryngoscopy with CO2 laser dilation,      subsequent.  2. Kenalog injection.   SURGEON:  Dr. Christia Reading.   ANESTHESIA:  General jet Venturi.   COMPLICATIONS:  None.   INDICATIONS:  The patient is a 60 year old white female who developed  tracheal stenosis after a complicated course following abdominal  surgery.  She has undergone laser dilation on three occasions.  Her last  procedure was December 21 and she had a good result for three weeks.  She has had more restricted breathing over the past week.  She presents  to the operating room for surgical management.   FINDINGS:  The trachea is narrowed at the previous tracheostomy level to  approximately a 60% stenosis.  The patient was dilated up to a number 42  dilator.   DESCRIPTION OF PROCEDURE:  The patient was identified in the holding  room and informed consent having been obtained, the patient was moved to  the operative suite and put on the operating table in supine position.  Anesthesia was induced.  The patient was maintained via mask  ventilation.  The patient was given intravenous steroids during the case  and the eyes taped closed.  The table was turned 90 degrees from  anesthesia under mask.  A tooth guard was placed and the larynx was  exposed using the second largest Storz laryngoscope.  The laryngoscope  was placed in a subglottic position and was then suspended on the Mayo  stand with a Lewy arm.  A 0 degrees telescope was used to  take  preoperative photograph of the stenosis.  An epinephrine soaked pledget  was held against the stenosis while jet was being held for about 30  seconds.  Epinephrine was removed.  Jet ventilation was resumed.  Kenalog was injected into the stenosis totaling about 1.2 mL injected  into several positions around the stenosis.  Some of the injection  leaked out.  The CO2 laser was then hooked up and using an operating  microscope, radial cuts were made at 4 o'clock, 6 o'clock, and 9 o'clock  to the wall of the trachea.  Dilators were then placed in a serial  fashion starting with a 34 and dilated up to a 42 without difficulty.  After this, the airway was suctioned.  Postoperative photograph was  made.  The laryngoscope was then taken out of suspension and removed  from the  patient's mouth while an LTA was injected into the airway for topical  anesthesia.  The tooth guard was removed and the patient was turned back  to  Anesthesia for wake-up.  Eye pads and a damp towel were used over the  face during laser use.  The patient was moved to recovery room stable  condition.      Antony Contras, MD  Electronically Signed     DDB/MEDQ  D:  04/10/2006  T:  04/10/2006  Job:  [id number]

## 2010-07-26 NOTE — Op Note (Signed)
NAMEALEXANDRE, [name]                ACCOUNT NO.:  [id number]   MEDICAL RECORD NO.:  [id number]          PATIENT TYPE:  OIB   LOCATION:  0098                         FACILITY:  [facility]   PHYSICIAN:  Thornton Park. Daphine Deutscher, MD  DATE OF BIRTH:  12/02/1950   DATE OF PROCEDURE:  09/09/2005  DATE OF DISCHARGE:                                 OPERATIVE REPORT   .   PREOPERATIVE DIAGNOSIS:  Morbid obesity, previous open cholecystectomy, open  Nissen fundoplication.   POSTOPERATIVE DIAGNOSIS:  Morbid obesity, previous open cholecystectomy,  open Nissen fundoplication.   PROCEDURES:  Laparoscopy, enterolysis of abdominal adhesions x2 hours,  closure of hiatal hernia, upper endoscopy by Dr. Ezzard Standing, anterior truncal  vagotomy, placement of the VG lap band.   SURGEON:  Thornton Park. Daphine Deutscher, M.D.   ASSISTANT:  Sandria Bales. Ezzard Standing, M.D.   ANESTHESIA:  General.   OPERATIVE TIME:  6 hours.   DESCRIPTION OF PROCEDURE:  [name] was taken to room 1 and given  general anesthesia.  The abdomen was prepped widely with chlorhexidine and  draped sterilely.  I entered the abdomen through the left upper quadrant  using an OptiVu 11 mm trocar without difficulty, although she was very  thick.  The abdomen was insufflated and the scope was inserted and she had a  marked number of small and large bowel and omental adhesions to the anterior  abdominal wall which initially were not clear.  She has a long midline  incision from a previous Nissen and she had a previous cholecystectomy  incision, as well.  I had to create a perimeter of 5 mm ports both in the  left and right lower quadrants to be able to free this.  I found that she  had a large ventral incisional hernia which I was able to take down using a  harmonic scalpel, but this took time and I spent over 2 hours taking down  these adhesions and delineating this.  I did not feel that I in any way  compromised the small intestine or the colon.  I got that  cleaned up all the  way to the liver.  At that point, we shifted gears and started working on  the foregut.  I got beneath the liver and created a space for the  Nathanson's retractor which I inserted through a separate 5 mm trocar.  I  created some 5 mm ports in the upper abdomen which I subsequently changed  out to a 10, 11, and also a 15.  The dissection of the foregut took at least  2 hours and I carefully dissected free the esophagogastric junction and was  able to get a Penrose drain around it and delineate both the right and left  crus.  The previous Nissen, and had the previous upper GI on the board in  the room, had a lot of clips around it, but it had been, by upper GI, was  ineffective Nissen was pretty much fused.  I was unable to take this down  and felt that it would be hazardous.  I did take  a large posterior fatty  herniation up in the mediastinum and I completely removed that.  I then  closed the hiatus with two pledgeted sutures using the Endostitch and made  that more snug.  I had been searching the whole time I dissected the  posterior mediastinum for the posterior vagus nerve and I never could  identify that structure.  However, I did take the anterior vagus nerve as it  easily visualized.  I then used the band passer and had created a space for  the Penrose. I went down as far as I could on the right crus and then tried  to go across and actually tried to go a little lower and encountered some  splenic bleeding which I controlled with FloSeal.  I then made my band  passer a little bit higher and ended up putting a VG band over that area  which went around easily and blocked. I then plicated the stomach anteriorly  over that to create a very small pouch which we visualized endoscopically  when Dr. Ezzard Standing endoscoped her again.  Three sutures were used to plicate  and hold this anteriorly.  There was no evidence of bleeding, no evidence of  enterotomies on the upper  endoscopy or anywhere else in the bowel.  We  reexamined the large complex ventral hernia and felt that it had a broad  base and would likely not pose a problem with bowel entrapment; it would be  best repaired after the patient had lost weight; and its repair would add  significant time to the present OR time. I then deflated the abdomen and  removed the retractor and created a port for the 15 site, anchored it in  place with four sutures of Prolene, and I closed the 15 trocar site with a  single simple suture of 0 Vicryl.  The wounds were all irrigated with saline  and port site was closed with 4-0 Vicryl.  All the other wounds were closed  with staples.  The patient seemed to tolerate the procedure well and was  taken to the recovery room in satisfactory condition.      Thornton Park Daphine Deutscher, MD  Electronically Signed     MBM/MEDQ  D:  09/09/2005  T:  09/09/2005  Job:  [id number]   cc:   Jeoffrey Massed, MD  Fax: [phone]

## 2010-07-26 NOTE — Op Note (Signed)
[name], [name]                ACCOUNT NO.:  [id number]   MEDICAL RECORD NO.:  [id number]          PATIENT TYPE:  INP   LOCATION:  0159                         FACILITY:  [facility]   PHYSICIAN:  Thornton Park. Daphine Deutscher, MD  DATE OF BIRTH:  12/15/1950   DATE OF PROCEDURE:  11/19/2005  DATE OF DISCHARGE:                                 OPERATIVE REPORT   PROCEDURE:  Change vac dressing in OR #11   SURGEON:  Dr. Daphine Deutscher   ASSISTANT:  Alfonse Ras, M.D.   DESCRIPTION OF PROCEDURE:  [name] was taken back to room 11.  The  column and mesh had been taken out 2 days before.  I removed the vac  dressing and irrigated with saline.  The wound itself had more granulation  tissue appearing over the bowel and when she would cough it did have a  uniform feel to it as if it was pretty well stuck together.  I irrigated and  then put a white linen towel on top of the bowel and saw no evidence of any  bowel leaks or fistula.  I then replaced the vac dressing.  She was taken  directly back to intensive care unit for follow-up.      Thornton Park Daphine Deutscher, MD  Electronically Signed     MBM/MEDQ  D:  11/19/2005  T:  11/20/2005  Job:  [id number]

## 2010-07-26 NOTE — Discharge Summary (Signed)
[name], [name]                ACCOUNT NO.:  [id number]   MEDICAL RECORD NO.:  [id number]          PATIENT TYPE:  OIB   LOCATION:  0153                         [facility]ACILITY:  [facility]   PHYSICIAN:  Thornton Park. Daphine Deutscher, MD  DATE OF BIRTH:  Jun 10, 1950   DATE OF ADMISSION:  09/09/2005  DATE OF DISCHARGE:  09/11/2005                                 DISCHARGE SUMMARY   ADMISSION DIAGNOSIS:  Morbid obesity for laparoscopic/open adjustable  gastric banding placement with possible vagotomy.   HOSPITAL COURSE:  [name] was an a.m. admission and went to the OR on  September 09, 2005, for laparoscopic adjustable gastric band.  After extensive  dissection to take out all the upper adhesions from her previous open Nissen  fundoplication, I was able to perform an anterior vagotomy but could not  find any evidence of a posterior vagus nerve, a VG laparoscopic band and  repair of her hiatal hernia was performed laparoscopically.  Postoperatively, she was taken to the intensive care unit for observation.  On July 3 on follow up, she was stable and was scheduled for a Gastrografin  upper GI on September 10, 2005, which was performed.  This showed the band in good  position at 2 o'clock, no evidence of leak, and normal motility of the small  bowel.  She was kept in the ICU.  Dopplers were not done on July 4 as  requested and we were kept here for two days until postop day two when we  could get her venous Doppler study done.  Once that was done, she was  anxious to go home, if possible, and she was given a prescription for  Roxicet Elixir and scheduled to return to the office in 7-10 days for staple  removal.  The patient was, thus, discharged on September 11, 2005.      Thornton Park Daphine Deutscher, MD  Electronically Signed     MBM/MEDQ  D:  10/21/2005  T:  10/21/2005  Job:  [id number]

## 2010-07-26 NOTE — Op Note (Signed)
NAMEROGUE, PAUTLER                ACCOUNT NO.:  [id number]   MEDICAL RECORD NO.:  [id number]          PATIENT TYPE:  INP   LOCATION:  0159                         FACILITY:  [facility]   PHYSICIAN:  Antony Contras, MD     DATE OF BIRTH:  [birth year]   DATE OF PROCEDURE:  11/19/2005  DATE OF DISCHARGE:                                 OPERATIVE REPORT   PREOPERATIVE DIAGNOSIS:  Stridor.   POSTOPERATIVE DIAGNOSES:  1. Stridor.  2. 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   PROCEDURE:  Diagnostic nasopharyngoscopy.   SURGEON:  Antony Contras, MD.   ANESTHESIA:  Topical.   COMPLICATIONS:  None.   INDICATION:  The patient is a 60 year old white female with a complicated  course following abdominal surgery including tracheostomy.  After  decannulation, a general surgery procedure required placement of a small  breathing tube because of resistance placing the tube.  She has had  inspiratory stridor since that time.  An upper airway examination is  required.   FINDINGS:  There is scattered thick mucous through the pharynx.  Pharynx is  widely patent.  Vocal folds are mobile with good closure and no scarring.  The trachea has a 50% narrowing at the tracheostomy level that appears to be  due to a collapsed tracheal ring.   DESCRIPTION OF PROCEDURE:  The patient is identified in the ICU and after  informed consent was obtained, the right nasal passage was sprayed with a  combination of Afrin and lidocaine.  A fiberoptic laryngoscope was passed  through the nasal passage to view the pharynx and larynx.  Findings are  noted above.  After this, the telescope was removed and the patient was  returned to ICU care.   RECOMMENDATIONS:  1. The obstruction appears to be an anatomical fixed obstruction and will      not likely response very well to medical therapy such as racemic      epinephrine.  Certainly, if her breathing worsens, this would be a      reasonable thing to try, to  try to reduce swelling.  Treatment of her      narrowing may be able to be performed endoscopically, but an open      tracheal resection may be required.  I have talked with Dr. Daphine Deutscher and      plan to perform a laryngoscopy and bronchoscopy at the time of his next      procedure in order to better characterize the narrowing.  It may be      that if she needs further general anesthetic procedures requiring      endotracheal intubation, that she would be best served to have a      tracheostomy replaced to allow this to be done without continued trauma      to the trachea.  At that time, it may be that a portion of that      collapsed ring could be removed when the      tracheostomy tube is placed.  I will discuss this with Dr. Daphine Deutscher  further and will make plans according to his plans for her abdominal      procedures.  For the time being, I do not see any impending airway      crisis and any work on her airway can wait until she has further      abdominal work done.      Antony Contras, MD  Electronically Signed     DDB/MEDQ  D:  11/20/2005  T:  11/21/2005  Job:  [id number]

## 2010-07-26 NOTE — Op Note (Signed)
NAMEPHILICIA, [name]                ACCOUNT NO.:  [id number]   MEDICAL RECORD NO.:  [id number]          PATIENT TYPE:  AMB   LOCATION:  SDS                          FACILITY:  [facility]   PHYSICIAN:  Antony Contras, MD     DATE OF BIRTH:  [birth year]   DATE OF PROCEDURE:  DATE OF DISCHARGE:                                 OPERATIVE REPORT   PREOPERATIVE DIAGNOSIS:  Tracheal stenosis.   POSTOPERATIVE DIAGNOSIS:  Tracheal stenosis.   PROCEDURE:  Suspended micro direct laryngoscopy with CO2 laser dilation of  the trachea.   SURGEON:  Antony Contras, MD   ANESTHESIA:  General.   COMPLICATIONS:  None.   INDICATIONS:  The patient is a 60 year old white female who had a  complicated course recently following bariatric surgery including need for  tracheostomy.  After decannulation, she developed inspiratory stridor and  was found to have stenosis of the trachea at the site of the tracheostomy.  A revision tracheostomy was performed and she has since had stabilization of  her abdomen surgical issues.  She presents to the operating room for airway  evaluation and dilation of the stenosis.   FINDINGS:  The trachea was found to be 70% stenotic at the third tracheal  ring level.  This was just above the revision tracheostomy site.  Dilation  was performed up to a 42 Jackson dilator after laser incisions were made.  The tracheostomy tube was replaced and she will go home with a tracheostomy  tube.   DESCRIPTION OF PROCEDURE:  The patient is identified in the holding room and  informed consent having been obtained.  The patient was moved to the  operative suite and placed on the operating room table in the supine  position.  Anesthesia was induced and the tracheostomy tube was removed and  exchanged for a cuffed 6 endotracheal tube.  A ventilation was then resumed.  The bed was turned 90 degrees from anesthesia and tape was placed.  Damp  pads were placed over the eyes and taped in place.  A  tooth guard was placed  and the larynx was then exposed using a Jacko laryngoscope.  This was placed  into a glottic position and a 0 degree telescope was then used to further  document the stenosis.  The operating microscope was then brought into the  field and hooked to the CO2 laser.  Radial incisions were then made at 5  o'clock, 7 o'clock and 10 o'clock using the CO2 laser on a setting of 4  watts continuous.  After this, the airway was suctioned and dilators were  then passed through the trachea starting with a 32 and serially dilating up  to a 42.  Jet Venturi ventilation was performed during laser incision and  dilation.  After this, the larynx was sprayed with topical lidocaine and the  laryngoscope was taken out of suspension and removed from the patient's  mouth while the area was suctioned.  The tooth guard was removed and the  patient was  turned back to Anesthesia for wake-up.  When she was awake,  the endotracheal  tube was removed and exchanged for her #4 uncuffed Shiley trache tube.  She  was then moved to the recovery room in stable condition.  Of note, during  laser used, a damp towel was placed covering the patient's face.      Antony Contras, MD  Electronically Signed     DDB/MEDQ  D:  12/24/2005  T:  12/25/2005  Job:  [id number]

## 2010-07-26 NOTE — Op Note (Signed)
NAMEHASINI, PEACHEY                ACCOUNT NO.:  [id number]   MEDICAL RECORD NO.:  [id number]          PATIENT TYPE:  INP   LOCATION:  1510                         FACILITY:  [facility]   PHYSICIAN:  Thornton Park. Daphine Deutscher, MD  DATE OF BIRTH:  04-04-1950   DATE OF PROCEDURE:  09/15/2005  DATE OF DISCHARGE:                                 OPERATIVE REPORT   PREOPERATIVE DIAGNOSIS:  Incarcerated ventral incisional hernias.   POSTOPERATIVE DIAGNOSIS:  Incarcerated ventral incisional hernias.   PROCEDURE:  Laparoscopy, repair of multiple ventral and abdominal wall  hernias with repair with cadaveric skin mesh.   SURGEON:  Dr. Daphine Deutscher.   ASSISTANT:  Dr. Colin Benton.   ANESTHESIA:  General endotracheal.   DRAINS:  Two JPs in the subcutaneous pockets.   DESCRIPTION OF PROCEDURE:  Ms. Vanzandt was taken to Room 1, and she is  approximately 6 days postoperative from having an extensive enterolysis and  foregut surgery involving placement of a VG gastric band.  She presented on  Saturday with abdominal pain and was found to have markedly distended  stomach and NG tube relieved that.  She persisted in appearing to have a  small bowel obstruction.  I returned from a meeting today and examined her  and recommended repair of her ventral incisional hernias. After her abdomen  was prepped with chlorhexidine and draped sterilely,  I removed most all the  staples except for the 4 or 5 staples that were over her port.  I went  through her left upper quadrant with a 10-mm trocar site with the OptiVu and  was able to pass that easily into the abdomen and then reinsufflated the  abdomen with CO2.  With that, I was able to place two 5-mm trocars over on  the right side, and I found the mesentery of the bowel going up into this  with this being central hernia that was sort of down around the umbilical  region.  With very gentle traction, I was able to gradually reduce the  contents, found several feet of small bowel in  these which later proved to  be very complex tortuous hernia sacs throughout her abdominal wall.  After  complete reduction, I wanted to make sure that there was no evidence of  perforation as there was a little bit of exudative reaction of small bowel.  I went ahead and made a longitudinal incision in her old incision down  inferior to the umbilicus, entered a hernia sac and found its fascial  margins.  I identified the loops of bowel that had been incarcerated and I  ran the bowel back to the ligament of Treitz as well as the terminal ileum.  I found no evidence of perforation. I then examined the complex fascia and  multilayers of hernia defect that were palpable from this periumbilical  incision. I could feel up  superiorally to that, and I did not feel an  obvious defect although it had kind of a Swiss cheese consistency initially  above that, but in retrospect, there was even another bigger hernia cephalad  to where I  was.  I went ahead and closed this area with interrupted sutures  of #1 Novofil.  I then put the scope back in and saw another area  more on  the left side and cut down on it with a little transverse incision and  repaired that.  However, when I went back in with the scope I could see that  I was just repairing one of the rooms of an even larger hernia.  At that  point, I connected these 2 incisions, making somewhat of a hockey-stick  incision and went down and found the even greater fascial defect which lay  to the left of midline, and I ended up repairing that with horizontal  mattress sutures of again #1 Novofil through cadaveric skin bioallograft  which I sutured to the fascia outside.  However,  some of this mesh was  exposed to the inside of the abdominal wall just because there was no way to  approximate the edges because of the wound tension  as a result of her  obesity.  This closed that defect with minimal tension. When that had been  done, I put the scope in a  third time again using the 5- and 10-mm scopes,  and at this time, I felt that the best I could do.  This gigantic multi-  complicated hernia had been closed, hopefully to prevent further repeat  incarceration of small bowel loops.  I had removed the sacs as completely as  I could, particularly on the first one, and then I passed drains into these  2 sacs and sutured them with 3-0 nylon to the skin.  I washed out the wound  well.  I did do a gram stain and culture of the peritoneal fluid found upon  entering, and that came back showing no organisms and a few white cells.  That reinforced my clinical suspicion that there was no evidence of  perforation; either visually, by odor, by appearance of the fluid, and  finally by gram stain. After washing, I closed the skin with staples and  left the 2 drains in place which were in the subcutaneous pockets.  The  patient was taken to recovery room in satisfactory condition.  Preoperative,  she did get Cipro and heparin.      Thornton Park Daphine Deutscher, MD  Electronically Signed     MBM/MEDQ  D:  09/15/2005  T:  09/15/2005  Job:  [id number]   cc:   Lucky Cowboy, M.D.  Fax: [phone]

## 2010-07-26 NOTE — Op Note (Signed)
[name], POSTLETHWAIT                ACCOUNT NO.:  [id number]   MEDICAL RECORD NO.:  [id number]          PATIENT TYPE:  INP   LOCATION:  0161                         [facility]AC[facility]   PHYSICIAN:  Thornton Park. Daphine Deutscher, MD  DATE OF BIRTH:  [birth year]   DATE OF PROCEDURE:  10/01/2005  DATE OF DISCHARGE:                                 OPERATIVE REPORT   PROCEDURE:  1. Number 8 Shiley tracheostomy placement.  2. Change of her abdominal Wound VAC.   DESCRIPTION OF PROCEDURE:  The patient was taken back to room 6 and first,  the neck was prepped with Betadine and draped sterilely.  A transverse  incision was made and carried down to the midline.  The cricothyroid  membrane was identified and we went below that to the second and third  tracheal rings and I incised this and spread this.  Her endotracheal tube  was then removed and a #8 Shiley was placed.  Prior to doing that, I did put  two Prolene sutures down to the cartilage just above that.  This was to  hopefully this in place in case it was needed.  The trache was then sutured  to the skin with 3-0 nylon. Tape was taken around the neck to further secure  it.  I also put some nylon in to approximate the skin.   Next, Dr. Colin Benton and I went down to the abdomen and removed the previous VAC  dressing.  I used pulsatile lavage to wash the bowel washing the area.  It  generally looked much cleaner and is improving greatly.  I went down to the  pelvis and sucked out the fluid that I had seen on the CT scan earlier in  the day and this was just some irrigation fluid.  No evidence of an abscess.  We then put a new VAC abdominal dressing on tucking it underneath the fascia  and putting layers of foam on and securing it with an Ioban drape and put it  to suction.  It immediately sucked down and looked good.  She was then  returned to the intensive care unit in stable condition.      Thornton Park Daphine Deutscher, MD  Electronically Signed     MBM/MEDQ   D:  10/01/2005  T:  10/01/2005  Job:  [id number]

## 2010-07-26 NOTE — Op Note (Signed)
[name], LUCCHESE                ACCOUNT NO.:  [id number]   MEDICAL RECORD NO.:  [id number]          PATIENT TYPE:  INP   LOCATION:  0161                         [facility]ACILITY:  [facility]   PHYSICIAN:  Thornton Park. Daphine Deutscher, MD  DATE OF BIRTH:  13-Mar-1950   DATE OF PROCEDURE:  09/20/2005  DATE OF DISCHARGE:                                 OPERATIVE REPORT   PROCEDURE:  Change of abdominal wound VAC.   SURGEON:  Dr. Luretha [name].   ASSISTANT:  Dr. Alfonse Ras.   DESCRIPTION OF PROCEDURE:  Ms. Fiebig was taken to room 3 and placed on the  table and given some general per her tracheostomy.  The previous VAC was  removed.  Dawn, the wound nurse, was with [place] as we irrigated with pulsatile  lavage.  The porcine CollaMend was inspected and there are some small buds  but certainly not enough to sustain a graft at this point.  After irrigating  well, we changed the way the [facility] was applied this time, putting some Mepitel  onto the graft before applying the VAC sponge down on this.  Separate pieces  were applied down into the two little openings on either side along with  bridging sponges.  The Ioban drape was applied and then was placed to  suction.  The patient tolerated this and was taken back to the intensive  care unit postoperatively.      Thornton Park Daphine Deutscher, MD  Electronically Signed     MBM/MEDQ  D:  10/22/2005  T:  10/22/2005  Job:  [id number]

## 2010-07-26 NOTE — Op Note (Signed)
NAMEFAWNDA, VITULLO                ACCOUNT NO.:  [id number]   MEDICAL RECORD NO.:  [id number]          PATIENT TYPE:  INP   LOCATION:  0158                         [facility]AC[facility]   PHYSICIAN:  Lyman Speller, MD       DATE OF BIRTH:  1950-11-26   DATE OF PROCEDURE:  12/02/2005  DATE OF DISCHARGE:  12/12/2005                               OPERATIVE REPORT   SURGEON:  C.  Gerrie Nordmann, MD   FIRST ASSISTANT:  Luretha [name], MD   ANESTHESIA:  General orotracheal.   PREOPERATIVE DIAGNOSIS:  Granulated open abdominal wound.   POSTOPERATIVE DIAGNOSIS:  Granulated open abdominal wound.   OPERATION PERFORMED:  Split-thickness skin graft to the open abdominal  wound.   DESCRIPTION OF OPERATION:  The patient was taken to the operating room  and initially underwent manipulation and exploration of her tracheostomy  site.  Care of the patient was then assumed.  The abdomen was prepped  and draped in the usual sterile fashion.  The left thigh was likewise  prepped and draped in the usual sterile fashion.  Examination at this  time revealed a well-granulated 15-cm-diameter abdominal wound.  The  procedure was begun by lightly scraping the abdominal wound to take off  the outer surface of the granulation tissue.  This area was noted to  bleed readily.  A moistened saline gauze was then placed over this area.  Attention was then turned to the left thigh, at which a split-thickness  skin graft was marked.  The dermatome was then set at 15/1000 of an inch  and a panel skin was harvested from the left anterolateral thigh.  This  was then placed on a 1-1/2:1 meshing board and run through the mesher.  The abdominal wound was once again examined and all remaining bleeding  was controlled with the Bovie electrocautery.  The split-thickness skin  graft was then placed on the open wound and sutured using of 4-0 chromic  sutures.  Once the graft was in place, a bulky tie-over bolster dressing  was then  placed.  It should be noted that the graft was covered  liberally with antibiotic ointment and Xeroform gauze prior to placement  of the tie-over bolster.  The harvest site was then cleansed and covered  with a large OpSite occlusive dressing.  A bulky cover dressing was then  placed over the tie-over abdominal dressing.  The final sponge and  needle count was correct.  The estimated blood loss was less than 25 mL.  The patient was awakened from anesthesia without difficulty and  transferred to the recovery room.  No complications were identified.      Lyman Speller, MD     CWB/MEDQ  D:  02/22/2006  T:  02/23/2006  Job:  [id number]

## 2010-07-26 NOTE — Consult Note (Signed)
Ariana [name], [name]                ACCOUNT NO.:  [id number]   MEDICAL RECORD NO.:  [id number]          PATIENT TYPE:  INP   LOCATION:  0159                         FACILITY:  [facility]   PHYSICIAN:  Antony Contras, MD     DATE OF BIRTH:  1950-04-21   DATE OF CONSULTATION:  11/19/2005  DATE OF DISCHARGE:                                   CONSULTATION   REQUESTING SERVICE:  Dr. Luretha [name].   CHIEF COMPLAINT:  Stridor.   HISTORY OF PRESENT ILLNESS:  The patient is a 60 year old white female who  underwent gastric banding on July 3rd and was re-admitted on July 7th and  underwent ventral hernia repairing.  She was re-admitted again on July 15th  with shortness of breath and abdominal swelling and was found to have 3 L of  infected hematoma in her abdomen that was removed, along with lysis of  adhesions.  The banding was also removed.  She was left with an open  abdominal wound and has had multiple dressing changes since then.  A  tracheostomy was placed July 25th, due to prolonged mechanical ventilation  and respiratory failure.  A #8 Shiley trach tube was placed.  She was able  to then be weaned from the ventilator, and downsizing began August 6th.  A  Passy-Muir valve was not successful, as this caused anxiety and did not  result in a good voice when she had a #6 Shiley trach tube in place.  She  was able to make voice with a #4 Shiley in place with a Passy-Muir valve.  Ultimately, she was de-cannulated on August 30th and had a good voice.  She  underwent a wound debridement on September 10th under general anesthesia,  and a #6 endotracheal tube had to be used because of resistance placing a #7  tube.  Since the extubation from that procedure, she has had inspiratory  stridor with anxiety and exertion.  She has been treated with racemic  epinephrine.  She has no other complaints regarding her airway currently,  except the noise with breathing and a mild degree of hoarseness.   PAST  MEDICAL HISTORY:  1. Morbid obesity.  2. Hyperlipidemia.  3. Hypertension.  4. Diabetes.   PAST SURGICAL HISTORY:  1. Knee surgery.  2. Cholecystectomy.  3. Appendectomy.  4. Abdominoplasty.  5. Breast reduction.  6. Hysterectomy.  7. Nissen fundoplication.   SOCIAL HISTORY:  The patient is married and denies smoking.  She admits to  minimal alcohol use.   MEDICATIONS:  1. Colace.  2. Lovenox.  3. Duragesic.  4. Diflucan.  5. Insulin.  6. Xopenex.  7. Solu-Medrol.  8. Protonix.  9. Tygacil.  10.Vancomycin.   ALLERGIES:  1. Penicillin.  2. Naproxen.  3. Cephalosporins.  4. Trazodone.   REVIEW OF SYSTEMS:  She admits to anxiety and abdominal  discomfort related  to her surgeries.  Other systems are negative.   PHYSICAL EXAMINATION:  VITAL SIGNS:  Temperature 97 degrees, pulse 65, blood  pressure 120/85, sat 93% on room air, respirations 25.  GENERAL:  The patient  is in no acute distress, is pleasant and cooperative  and alert and oriented.  She is obese.  HEENT:  Her voice is minimally scratchy.  She has switching inspiratory  stridor at rest.  Eyes:  Extraocular movements are intact, and pupils are  equal, round and reactive to light.  Ears:  External auditory canals and  tympanic membranes are normal.  Middle ears are aerated.  Nose:  Nasal  passages are patent.  There is no bleeding or clots.  Oral  cavity/oropharynx:  Mouth is normal with normal tongue and mucosa.  There  are no lesions in the mouth.  NECK:  Her trach site is healing well with no air leak.  There is no mass or  lymphadenopathy in the neck.   ASSESSMENT:  The patient is a 60 year old white female with a complicated  course following abdominal surgery, now with inspiratory stridor with  minimal exertion following decannulation from tracheostomy tube.   PLAN:  A fiberoptic examination will be performed of the larynx and upper  airway.  Recommendations will follow.      Antony Contras,  MD  Electronically Signed     DDB/MEDQ  D:  11/20/2005  T:  11/21/2005  Job:  [id number]   cc:   Antony Contras, MD  Fax: [phone]

## 2010-07-26 NOTE — Op Note (Signed)
NAMESAIDY, ORMAND                ACCOUNT NO.:  [id number]   MEDICAL RECORD NO.:  [id number]          PATIENT TYPE:  INP   LOCATION:  0153                         FACILITY:  [facility]   PHYSICIAN:  Thornton Park. Daphine Deutscher, MD  DATE OF BIRTH:  [birth year]   DATE OF PROCEDURE:  09/23/2005  DATE OF DISCHARGE:                                 OPERATIVE REPORT   PREOPERATIVE DIAGNOSIS:  Open wound, with temporary closure.   POSTOPERATIVE DIAGNOSIS:  Open wound, with temporary closure.  Subcutaneous  infection appears controlled.   PROCEDURE:  Laparotomy, exploration of abdomen, with irrigation and  drainage, further debridement of fascia, and inspection of subcutaneous  tissue of the abdomen and lower chest, and placement of VAC dressing,  reclosure proximal gastrotomy with silk, Tisseel, and omental patch, with  drain.   DESCRIPTION OF PROCEDURE:  Ms. Graybill was taken to room 11 as a prior to  basis, bumping the schedule, mainly because of some increased odor and  collections of fluid beneath the large IV bag.  Hemodynamically, she had  been stable, and her x-ray had actually proved.  She was taken back to room  11, where the abdomen was prepped with Betadine widely, and then the bag was  removed.  First order was irrigation and then exploration of the lower  abdomen.  No inner loop collections were seen.  In the proximal foregut,  there appeared to be a little more drainage, and we went down. There  appeared to be some intermittent seepage and leakage from the place of the  previous silk suture placement.  Two more 3-0 silks were placed to close  that area.  Tisseel was then applied.  Dr. Colin Benton and Dr. Johna Sheriff assisted  in this maneuver, and then the long omental graft easily stretched up and  laid over this area.  A 19 Blake drain was subsequently placed and brought  out through the patient's right side through a previous opening.   We then did more debridement on the right and left side just  around the  wound and inspected the cavities.  We also opened two areas of the chest to  may sure there was no necrotic fascia in that area.  There was not any.  We  irrigated with the pulse lavage again with saline and then poured some of  the double antibiotic solution in.   A large abdominal VAC dressing was then applied after tucking the initial  membrane in beneath the fascia and then multiple layers on top, and it was  held in place with the adherent plastic drapes as well as an Ioban drape.  This was connected to suction, as was the JP drain.  The patient seemed to  tolerate the procedure well and returned to ICU.   ASSISTANTS:  1.  Alfonse Ras, M.D.  2.  Sharlet Salina T. Hoxworth, M.D.   ESTIMATED BLOOD LOSS:  Minimal.   BLOOD TRANSFUSIONS THE:  The patient got 2 units of packed cells.      Thornton Park Daphine Deutscher, MD  Electronically Signed     MBM/MEDQ  D:  09/24/2005  T:  09/24/2005  Job:  [id number]

## 2010-07-26 NOTE — H&P (Signed)
[name], [name]                ACCOUNT NO.:  [id number]   MEDICAL RECORD NO.:  [id number]          PATIENT TYPE:  INP   LOCATION:  0102                         FACILITY:  [facility]   PHYSICIAN:  Angelia Mould. Derrell Lolling, M.D.DATE OF BIRTH:  September 02, 1950   DATE OF ADMISSION:  09/13/2005  DATE OF DISCHARGE:                                HISTORY & PHYSICAL   CHIEF COMPLAINT:  Abdominal pain.   HISTORY OF PRESENT ILLNESS:  This is a 60 year old white female who  underwent a laparoscopic banding procedure for morbid obesity on September 09, 2005 by Dr. Luretha [name].  He describes that the patient had a Nissen  fundoplication in the past, but he could not take that down, so the Nissen  fundoplication was left in place, a lap band was performed.  A partial  vagotomy was performed, and a diaphragmatic hiatus was tightened up with 2  pledgetted sutures.  Apparently, this was a very lengthy procedure.  The  patient was discharged home on September 11, 2005.  Postoperative Gastrografin  swallow showed no obstruction and no leak.   She states that she has not had a bowel movement for 6 days.  She states  that she has steady, diffuse, centralized abdominal pain.  She is somewhat  nauseated, but has not vomited.  She called this morning and was asked to  come to the emergency room.   PAST HISTORY:  1.  Gastric banding September 09, 2005 as above.  2.  Status post laparoscopic Nissen fundoplication.  3.  Status post partial hysterectomy.  4.  Status post breast reduction.  5.  Status post abdominoplasty.  6.  Status post cholecystectomy and appendectomy.  7.  Status post knee surgery.  8.  She states that she has pre diabetes.  9.  History of hypertension.  10. History of hyperlipidemia.  11. History of obesity.   CURRENT MEDICATIONS:  1.  Bumex.  2.  Cozaar.  3.  Simvastatin.  4.  Baby aspirin.  5.  Gas-X.  6.  Roxicet.  7.  Nexium.   DRUG ALLERGIES:  1.  ANCEF.  2.  PENICILLIN causes skin rash.  3.   TAPE.  4.  ADHESIVES.   SOCIAL HISTORY:  The patient is married and lives in [place].  Denies  tobacco.  Drinks alcohol rarely.   FAMILY HISTORY:  Noncontributory.   REVIEW OF SYSTEMS:  All systems reviewed and noncontributory, except as  described above.   PHYSICAL EXAMINATION:  GENERAL:  Obese white female in moderate distress.  VITAL SIGNS:  Temperature 96.6, blood pressure 121/87, pulse 105 and  regular, respirations 20, oxygen saturation 96% on room air.  HEENT:  Eyes - sclerae clear.  Extraocular movements intact.  Ears, nose,  mouth, throat:  Nose, lips, tongue and oropharynx are without gross lesions.  NECK:  Supple, nontender.  No mass.  No jugular distension.  LUNGS:  Clear to auscultation.  No chest wall tenderness.  BREASTS:  Not examined.  ABDOMEN:  Morbidly obese.  Multiple recent scars which show no sign of  hernia or bleeding or infection.  She has bowel sounds that are present,  slightly diminished, but present all four quadrants.  She does seem to be  somewhat distended and firm, especially in the upper abdomen with tympany in  the upper abdomen.  She does not really seem to have peritonitis, but is  somewhat tender to palpate.  EXTREMITIES:  She moves all 4 extremities well without pain or deformity.  NEUROLOGIC:  No gross motor or sensory deficits.   ADMISSION DATA:  Abdominal x-rays show acute gastric distension and a little  bit of free air and a small bowel ileus and is not dramatic.  The gastric  distension is somewhat dramatic.  Blood work shows a hemoglobin of 12.7,  white count of 12,100.  Sodium 141, potassium 3.5, chloride 94, carbon  dioxide 35, glucose 228, BUN 13, creatinine 1.1, calcium 9.0, lipase 1325.   A Gastrografin swallow was done which shows no obstruction and no leak.   ASSESSMENT:  1.  Acute abdominal pain.  This seems to be a combination of gastroparesis      and possible pancreatitis.  Certainly a combination of narcotic use and       a vagotomy could cause acute gastroparesis.  The etiology of her      elevated lipase is not clear.  2.  Hyperglycemia.  3.  Hypertension.  4.  Morbid obesity.  5.  Hyperlipidemia.   PLAN:  1.  A nasogastric tube was placed in the emergency room, and we immediately      got 900 cc of fluid and some air back and she felt much better her      abdomen became much softer.  2.  She will be admitted.  3.  We will proceed with a CT scan of the abdomen and pelvis now to assess      for other complications and to assess the pancreas.  4.  For her diabetes, she will be treated with q.4 h sliding-scale insulin.  5.  For deep vein thrombosis prophylaxis, we will use compression hose for      now, and will hold off on any      anticoagulants until we are clear as to whether she has pancreatitis      not.  6.  She will be started on Reglan and proton pump inhibitors.  7.  We will use Toradol for pain.      Angelia Mould. Derrell Lolling, M.D.  Electronically Signed     HMI/MEDQ  D:  09/13/2005  T:  09/13/2005  Job:  [id number]   cc:   Lucky Cowboy, M.D.  Fax: [phone]   Thornton Park Daphine Deutscher, MD  [address]  [place]  [place] [address]

## 2010-07-26 NOTE — Op Note (Signed)
[name], [name]                ACCOUNT NO.:  [id number]   MEDICAL RECORD NO.:  [id number]            PATIENT TYPE:   LOCATION:                                 FACILITY:   PHYSICIAN:  Thornton Park. Daphine Deutscher, MD       DATE OF BIRTH:   DATE OF PROCEDURE:  10/18/2005  DATE OF DISCHARGE:                                 OPERATIVE REPORT   PROCEDURES PERFORMED:  Wound vac change.   SURGEON:  Thornton Park. Daphine Deutscher, M.D.   Threasa HeadsColin Benton.   DESCRIPTION OF PROCEDURE:  [name] was taken to room OR1 on Saturday  morning, August 11.  The previous wound vac was removed.  The CollaMend mesh  was inspected, and there appeared to be some contraction around the  perimeter and possibly some early little buds seen on the left side.  Its  attachment to the fascia was all intact.  This area was irrigated with the  pulsatile lavage.  The sponges in the two little holes on either side were  changed.  Overall, the wound looked good, and a new wound vac was applied in  a standard fashion using the band drape and supplemental adhesives as  needed.  In addition, we inspected her backside, and the area of breakdown  that occurred during the first week appeared to be looking better and will  be redressed with Restore.  Also, rectal tube was inserted because she is  having liquid stools.  This will be checked for C. diff.      Thornton Park Daphine Deutscher, MD  Electronically Signed     MBM/MEDQ  D:  10/18/2005  T:  10/19/2005  Job:  [id number]

## 2010-07-26 NOTE — Op Note (Signed)
NAMEJAIONNA, WEISSE                ACCOUNT NO.:  [id number]   MEDICAL RECORD NO.:  [id number]          PATIENT TYPE:  INP   LOCATION:  0161                         FACILITY:  [facility]   PHYSICIAN:  Thornton Park. Daphine Deutscher, MD  DATE OF BIRTH:  1950-03-22   DATE OF PROCEDURE:  10/04/2005  DATE OF DISCHARGE:                                 OPERATIVE REPORT   PROCEDURE:  Change abdominal Wound VAC.   SURGEON:  Thornton Park. Daphine Deutscher, M.D.   ASSISTANT:  Alfonse Ras, M.D.   ANESTHESIA:  General by tracheostomy.   DESCRIPTION OF PROCEDURE:  Alene Bergerson was taken to room one on Saturday  morning, October 04, 2005, and her previous VAC was removed.  On Thursday and  Friday, she had been gotten out of bed for a time.  What we noticed when the  [facility] was removed is it did lead to a little loss of domain of her abdomen  with more of her abdominal contents coming out and more space down in her  pelvis.  This area was then irrigated well and drain.  The Wound VAC was  removed and there was some exudate as we had let this go a little bit  longer, about one day.  This was irrigated well and then new abdominal Wound  VAC was placed.  There was no evidence of any bowel injury or drainage.  There was no drainage from her Harrison Mons drain which was up near the  esophagogastric junction.  The VAC was then placed using an Ioban drape and  then the other clear plastic pieces provided with the Wound VAC kit.  It was  then put to suction which allowed everything to come back down nicely and  restored the contour of the abdomen and held the abdominal contents into the  abdomen.  The plan going forward until her next wound change which will  probably be Monday evening, is to keep her not paralyzed but not get her out  of bed.      Thornton Park Daphine Deutscher, MD  Electronically Signed     MBM/MEDQ  D:  10/04/2005  T:  10/04/2005  Job:  [id number]

## 2010-07-26 NOTE — Op Note (Signed)
[name], [name]                ACCOUNT NO.:  [id number]   MEDICAL RECORD NO.:  [id number]          PATIENT TYPE:  INP   LOCATION:  0161                         FACILITY:  [facility]   PHYSICIAN:  Thornton Park. Daphine Deutscher, MD  DATE OF BIRTH:  1950/04/02   DATE OF PROCEDURE:  09/29/2005  DATE OF DISCHARGE:                                 OPERATIVE REPORT   PROCEDURE:  VAC change.   SURGEON:  Thornton Park. Daphine Deutscher, MD   ASSISTANT:  Alfonse Ras, MD   ANESTHESIA:  General by endotracheal tube.   DESCRIPTION:  The patient was taken to room 1 on 07/23 in the evening.  Her  previous VAC dressing was removed.  Minimal debridement was done on the  right and left side of the wound.  Meantime, they were changing her  endotracheal tube and she did cough and Valsalva and her abdominal  compartment although did push up a bit, she did not appear to want to  eviscerate.  After washing her out with the pulsatile lavage, we repacked  her with sponges and then put the abdominal packing on top of viscera  tucking it beneath the fascia and then put two sponges across that and we  sealed it with an Ioban drape and with the other VAC dressing materials.  Would like to try to move toward weaning and extubating her.  We will move  back to intensive care unit.      Thornton Park Daphine Deutscher, MD  Electronically Signed     MBM/MEDQ  D:  09/29/2005  T:  09/30/2005  Job:  [id number]

## 2010-12-11 LAB — BASIC METABOLIC PANEL
CO2: 27
Chloride: 107
Creatinine, Ser: 0.91
GFR calc Af Amer: >60
Potassium: 4.4
Sodium: 140

## 2010-12-11 LAB — CBC
Hemoglobin: 13.3
MCV: 91.7
RBC: 4.25
WBC: 4.8

## 2010-12-23 LAB — CBC
Hemoglobin: 14.2
MCHC: 34.6
MCV: 88.4
RBC: 4.65
RDW: 13.2

## 2010-12-23 LAB — BASIC METABOLIC PANEL
CO2: 29
Chloride: 106
Creatinine, Ser: 0.88
GFR calc Af Amer: >60
Glucose, Bld: 127 — ABNORMAL HIGH
Sodium: 139

## 2010-12-26 LAB — CBC
MCHC: 34
Platelets: 167
RDW: 13.3

## 2010-12-26 LAB — BASIC METABOLIC PANEL
CO2: 31
Calcium: 9.4
Creatinine, Ser: 0.83
Glucose, Bld: 125 — ABNORMAL HIGH

## 2011-05-22 ENCOUNTER — Other Ambulatory Visit: Payer: Self-pay | Admitting: Internal Medicine

## 2011-05-22 DIAGNOSIS — Z1231 Encounter for screening mammogram for malignant neoplasm of breast: Secondary | ICD-10-CM

## 2011-06-04 ENCOUNTER — Ambulatory Visit
Admission: RE | Admit: 2011-06-04 | Discharge: 2011-06-04 | Disposition: A | Discharge status: Home / Self Care | Payer: Medicare HMO | Source: Ambulatory Visit | Attending: Internal Medicine | Admitting: Internal Medicine

## 2011-06-04 DIAGNOSIS — Z1231 Encounter for screening mammogram for malignant neoplasm of breast: Secondary | ICD-10-CM

## 2012-05-18 ENCOUNTER — Other Ambulatory Visit: Payer: Self-pay

## 2012-05-18 DIAGNOSIS — Z9889 Other specified postprocedural states: Secondary | ICD-10-CM

## 2012-05-18 DIAGNOSIS — Z1231 Encounter for screening mammogram for malignant neoplasm of breast: Secondary | ICD-10-CM

## 2012-06-15 ENCOUNTER — Ambulatory Visit
Admission: RE | Admit: 2012-06-15 | Discharge: 2012-06-15 | Disposition: A | Discharge status: Home / Self Care | Payer: Medicare HMO | Source: Ambulatory Visit

## 2012-06-15 DIAGNOSIS — Z1231 Encounter for screening mammogram for malignant neoplasm of breast: Secondary | ICD-10-CM

## 2012-06-15 DIAGNOSIS — Z9889 Other specified postprocedural states: Secondary | ICD-10-CM

## 2012-06-15 LAB — HM MAMMOGRAPHY: HM Mammogram: NORMAL

## 2012-07-19 ENCOUNTER — Encounter: Payer: Self-pay | Admitting: Neurology

## 2012-07-19 ENCOUNTER — Ambulatory Visit (INDEPENDENT_AMBULATORY_CARE_PROVIDER_SITE_OTHER): Payer: Medicare HMO | Admitting: Neurology

## 2012-07-19 VITALS — BP 117/72 | HR 58 | Ht 63.0 in | Wt 208.0 lb

## 2012-07-19 DIAGNOSIS — G250 Essential tremor: Secondary | ICD-10-CM

## 2012-07-19 DIAGNOSIS — G252 Other specified forms of tremor: Secondary | ICD-10-CM | POA: Insufficient documentation

## 2012-07-19 MED ORDER — PRIMIDONE 250 MG PO TABS: ORAL_TABLET | ORAL | Status: DC

## 2012-07-19 NOTE — Progress Notes (Signed)
Reason for visit: Tremor  [name] is an 62 y.o. female  History of present illness:  [name] is a 62 year old right-handed white female with a history of an essential tremor. The patient believes that over time the tremor has gradually worsened. The patient has no family history of tremor. The tremor affects her head and neck, and the right greater than left upper extremities. The patient finds handwriting and using the computer is difficult for her. The patient has had some problems with slight confusion and spaciness on primidone, and the primidone level was around 15. The dose was cut back from 3 of the 250 mg tablets daily to 2.5 tablets daily. The patient has improved with her ability to focus and concentrate while driving. The patient is having to use Xanax throughout the day to supplement her primidone medication to help the tremor. The patient is using on average one half of a 1 mg Xanax tablets twice daily, and one full tablet at night. The patient denies any other new medical issues that have come up since last seen. Within the last 24 hours, the patient has become achy, with onset of diarrhea likely associated with a virus.  Past Medical History  Diagnosis Date  . Tremor   . Cerebrovascular disease   . Depression with anxiety   . Diabetes   . Dyslipidemia   . Hypertension   . Obesity   . Hypothyroidism     Past Surgical History  Procedure Laterality Date  . Breast reduction surgery    . 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 Gallbladder surgery    . Appendectomy    . Abdominal hysterectomy    . Laparoscopic gastric banding    . Abdominal wall mesh  removal    . Abdominal wall mesh  removal      Family History  Problem Relation Age of Onset  . Heart disease Mother   . Heart disease Father   . Diabetes Maternal Aunt     Social history:  reports that she has never smoked. She does not have any smokeless tobacco history on file. She reports that  she does not drink alcohol or use illicit drugs.  Allergies:  Allergies  Allergen Reactions  . Ancef (Cefazolin)   . Ivp Dye (Iodinated Diagnostic Agents)   . Penicillins     Medications:  No current outpatient prescriptions on file prior to visit.   No current facility-administered medications on file prior to visit.    ROS:  Out of a complete 14 system review of symptoms, the patient complains only of the following symptoms, and all other reviewed systems are negative.  Tremor Fatigue Cough Diarrhea Joint pain, achy muscles  Blood pressure 117/72, pulse 58, height 5\' 3"  (1.6 m), weight 208 lb (94.348 kg).  Physical Exam  General: The patient is alert and cooperative at the time of the examination. The patient is moderately obese.  Skin: No significant peripheral edema is noted.   Neurologic Exam  Cranial nerves: Facial symmetry is present. Speech is normal, no aphasia or dysarthria is noted. Extraocular movements are full. Visual fields are full. A head and neck tremor is noted.  Motor: The patient has good strength in all 4 extremities.  Coordination: The patient has good finger-nose-finger and heel-to-shin bilaterally. With finger-nose-finger, minimal tremor is seen. With the arms outstretched, a tremor is noted on the right greater than left upper extremities. At rest, no upper extremity tremor is  noted.  Gait and station: The patient has a normal gait. Tandem gait is normal. Romberg is negative. No drift is seen.  Reflexes: Deep tendon reflexes are symmetric.   Assessment/Plan:  One. Essential tremor  The patient will continue her Primidone and alprazolam. The patient seems to be getting by at this point. The patient indicates that her primary care physician has checked a Primidone level since the dose was reduced. The patient will followup in 6-8 months. The patient will contact our office if she requires medication adjustments.  Marlan Palau  MD 07/19/2012 9:00 AM  [facility] [address] [place], [place] [address]  Phone [phone] Fax [phone]

## 2012-08-30 ENCOUNTER — Other Ambulatory Visit (HOSPITAL_COMMUNITY): Payer: Self-pay | Admitting: Physician Assistant

## 2012-08-30 ENCOUNTER — Ambulatory Visit (HOSPITAL_COMMUNITY)
Admission: RE | Admit: 2012-08-30 | Discharge: 2012-08-30 | Disposition: A | Discharge status: Home / Self Care | Payer: Medicare HMO | Source: Ambulatory Visit | Attending: Physician Assistant | Admitting: Physician Assistant

## 2012-08-30 DIAGNOSIS — M4040 Postural lordosis, site unspecified: Secondary | ICD-10-CM | POA: Insufficient documentation

## 2012-08-30 DIAGNOSIS — M542 Cervicalgia: Secondary | ICD-10-CM

## 2012-08-30 DIAGNOSIS — M79609 Pain in unspecified limb: Secondary | ICD-10-CM | POA: Insufficient documentation

## 2012-08-30 DIAGNOSIS — M5030 Other cervical disc degeneration, unspecified cervical region: Secondary | ICD-10-CM | POA: Insufficient documentation

## 2013-01-14 ENCOUNTER — Other Ambulatory Visit: Payer: Self-pay | Admitting: *Deleted

## 2013-01-14 ENCOUNTER — Telehealth: Payer: Self-pay | Admitting: *Deleted

## 2013-01-14 MED ORDER — PANTOPRAZOLE SODIUM 40 MG PO TBEC: 40.0000 mg | DELAYED_RELEASE_TABLET | Freq: Every day | ORAL | Status: DC

## 2013-01-14 NOTE — Telephone Encounter (Signed)
Refill on

## 2013-02-07 ENCOUNTER — Encounter (INDEPENDENT_AMBULATORY_CARE_PROVIDER_SITE_OTHER): Payer: Self-pay

## 2013-02-07 ENCOUNTER — Encounter: Payer: Self-pay | Admitting: Neurology

## 2013-02-07 ENCOUNTER — Ambulatory Visit (INDEPENDENT_AMBULATORY_CARE_PROVIDER_SITE_OTHER): Payer: Medicare HMO | Admitting: Neurology

## 2013-02-07 VITALS — BP 134/85 | HR 65 | Ht 63.0 in | Wt 215.0 lb

## 2013-02-07 DIAGNOSIS — G250 Essential tremor: Secondary | ICD-10-CM

## 2013-02-07 NOTE — Progress Notes (Signed)
Reason for visit: Tremor  [name] is an 62 y.o. female  History of present illness:  [name] is a 62 year old right-handed white female with a history of a tremor affecting both upper extremities and the head and neck. The patient has been on Mysoline taking 250 mg twice daily, and she is on alprazolam taking 1 mg at night, and one half tablet (0.5 mg) twice during the day. The patient indicates that this helps the tremor some, but she believes that her tremor is getting gradually worse as time goes on. The patient is having some problems with feeding himself, and with handwriting. The patient is having some drowsiness during the day, but this is not severe. The patient has had some worsening of depression, and she recently was placed on Wellbutrin. The patient reports some stomach upset associated with reflux, and she has added Nexium to the Protonix that she is taking. The patient returns to this office for an evaluation.  Past Medical History  Diagnosis Date  . Tremor   . Cerebrovascular disease   . Depression with anxiety   . Diabetes   . Dyslipidemia   . Hypertension   . Obesity   . Hypothyroidism     Past Surgical History  Procedure Laterality Date  . Breast reduction surgery    . 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 Gallbladder surgery    . Appendectomy    . Abdominal hysterectomy    . Laparoscopic gastric banding    . Abdominal wall mesh  removal    . Abdominal wall mesh  removal      Family History  Problem Relation Age of Onset  . Heart disease Mother   . Heart disease Father   . Diabetes Maternal Aunt     Social history:  reports that she has never smoked. She does not have any smokeless tobacco history on file. She reports that she does not drink alcohol or use illicit drugs.    Allergies  Allergen Reactions  . Ancef [Cefazolin]   . Ivp Dye [Iodinated Diagnostic Agents]   . Penicillins     Medications:  Current Outpatient  Prescriptions on File Prior to Visit  Medication Sig Dispense Refill  . ALPRAZolam (XANAX) 1 MG tablet Take 1 mg by mouth at bedtime as needed for sleep. One tablet at night, 1/2 tablet twice a day as needed      . aspirin 81 MG tablet Take 81 mg by mouth daily.      . Biotin 1 MG CAPS Take 1 capsule by mouth daily.      . bumetanide (BUMEX) 2 MG tablet Take 2 mg by mouth daily.      . Cholecalciferol (VITAMIN D3) 10000 UNITS capsule Take 10,000 Units by mouth daily.      [name] Kitchen FLUoxetine (PROZAC) 20 MG capsule Take 20 mg by mouth daily.       [name] Kitchen levothyroxine (SYNTHROID, LEVOTHROID) 50 MCG tablet Take 50 mcg by mouth daily before breakfast.      . pantoprazole (PROTONIX) 40 MG tablet Take 1 tablet (40 mg total) by mouth daily.  30 tablet  1  . potassium chloride SA (K-DUR,KLOR-CON) 20 MEQ tablet Take 20 mEq by mouth daily. 1/2 tablet daily      . PROBIOTIC CAPS Take 1 capsule by mouth daily.      . vitamin B-12 (CYANOCOBALAMIN) 100 MCG tablet Take 50 mcg by mouth daily.  No current facility-administered medications on file prior to visit.    ROS:  Out of a complete 14 system review of symptoms, the patient complains only of the following symptoms, and all other reviewed systems are negative.  Blurred vision Tremor Sleepiness Depression  Blood pressure 134/85, pulse 65, height 5\' 3"  (1.6 m), weight 215 lb (97.523 kg).  Physical Exam  General: The patient is alert and cooperative at the time of the examination. The patient is moderately to markedly obese.  Skin: No significant peripheral edema is noted.   Neurologic Exam  Mental status: The patient is oriented x 3.  Cranial nerves: Facial symmetry is present. Speech is normal, no aphasia or dysarthria is noted. Extraocular movements are full. Visual fields are full.  Motor: The patient has good strength in all 4 extremities.  Sensory examination: Soft touch sensation on the face, arms, and legs is symmetric.  Coordination:  The patient has good finger-nose-finger and heel-to-shin bilaterally.  Gait and station: The patient has a normal gait. Tandem gait is normal. Romberg is negative. No drift is seen.  Reflexes: Deep tendon reflexes are symmetric.   Assessment/Plan:  1. Essential tremor  2. Depression  The patient is having ongoing problems with depression. The patient could potentially benefit from anticholinergic medication such as Artane, but the patient is reporting some reflux issues at this time. The patient will be continued on her current medications, as the tremor does not impact her ability to perform activities of daily living. The patient will followup in 8 or 9 months.  Marlan Palau MD 02/07/2013 10:41 AM  [facility] [address] [place], [place] [address]  Phone [phone] Fax [phone]

## 2013-02-07 NOTE — Patient Instructions (Signed)
Tremor  Tremor is a rhythmic, involuntary muscular contraction characterized by oscillations (to-and-fro movements) of a part of the body. The most common of all involuntary movements, tremor can affect various body parts such as the hands, head, facial structures, vocal cords, trunk, and legs; most tremors, however, occur in the hands. Tremor often accompanies neurological disorders associated with aging. Although the disorder is not life-threatening, it can be responsible for functional disability and social embarrassment.  TREATMENT   There are many types of tremor and several ways in which tremor is classified. The most common classification is by behavioral context or position. There are five categories of tremor within this classification: resting, postural, kinetic, task-specific, and psychogenic. Resting or static tremor occurs when the muscle is at rest, for example when the hands are lying on the lap. This type of tremor is often seen in patients with Parkinson's disease. Postural tremor occurs when a patient attempts to maintain posture, such as holding the hands outstretched. Postural tremors include physiological tremor, essential tremor, tremor with basal ganglia disease (also seen in patients with Parkinson's disease), cerebellar postural tremor, tremor with peripheral neuropathy, post-traumatic tremor, and alcoholic tremor. Kinetic or intention (action) tremor occurs during purposeful movement, for example during finger-to-nose testing. Task-specific tremor appears when performing goal-oriented tasks such as handwriting, speaking, or standing. This group consists of primary writing tremor, vocal tremor, and orthostatic tremor. Psychogenic tremor occurs in both older and younger patients. The key feature of this tremor is that it dramatically lessens or disappears when the patient is distracted.  PROGNOSIS  There are some treatment options available for tremor; the appropriate treatment depends on  accurate diagnosis of the cause. Some tremors respond to treatment of the underlying condition, for example in some cases of psychogenic tremor treating the patient's underlying mental problem may cause the tremor to disappear. Also, patients with tremor due to Parkinson's disease may be treated with Levodopa drug therapy. Symptomatic drug therapy is available for several other tremors as well. For those cases of tremor in which there is no effective drug treatment, physical measures such as teaching the patient to brace the affected limb during the tremor are sometimes useful. Surgical intervention such as thalamotomy or deep brain stimulation may be useful in certain cases.  Document Released: 02/14/2002 Document Revised: 05/19/2011 Document Reviewed: 02/24/2005  [employer]® Patient Information ©2014 [employer].

## 2013-03-01 ENCOUNTER — Encounter: Payer: Self-pay | Admitting: Internal Medicine

## 2013-03-06 DIAGNOSIS — E039 Hypothyroidism, unspecified: Secondary | ICD-10-CM | POA: Insufficient documentation

## 2013-03-06 DIAGNOSIS — E782 Mixed hyperlipidemia: Secondary | ICD-10-CM | POA: Insufficient documentation

## 2013-03-06 NOTE — Patient Instructions (Signed)
Continue diet & medications same as discussed.   Further disposition pending lab results.    Hypertension As your heart beats, it forces blood through your arteries. This force is your blood pressure. If the pressure is too high, it is called hypertension (HTN) or high blood pressure. HTN is dangerous because you may have it and not know it. High blood pressure may mean that your heart has to work harder to pump blood. Your arteries may be narrow or stiff. The extra work puts you at risk for heart disease, stroke, and other problems.  Blood pressure consists of two numbers, a higher number over a lower, 110/72, for example. It is stated as "110 over 72." The ideal is below 120 for the top number (systolic) and under 80 for the bottom (diastolic). Write down your blood pressure today. You should pay close attention to your blood pressure if you have certain conditions such as:  Heart failure.  Prior heart attack.  Diabetes  Chronic kidney disease.  Prior stroke.  Multiple risk factors for heart disease. To see if you have HTN, your blood pressure should be measured while you are seated with your arm held at the level of the heart. It should be measured at least twice. A one-time elevated blood pressure reading (especially in the Emergency Department) does not mean that you need treatment. There may be conditions in which the blood pressure is different between your right and left arms. It is important to see your caregiver soon for a recheck. Most people have essential hypertension which means that there is not a specific cause. This type of high blood pressure may be lowered by changing lifestyle factors such as:  Stress.  Smoking.  Lack of exercise.  Excessive weight.  Drug/tobacco/alcohol use.  Eating less salt. Most people do not have symptoms from high blood pressure until it has caused damage to the body. Effective treatment can often prevent, delay or reduce that  damage. TREATMENT  When a cause has been identified, treatment for high blood pressure is directed at the cause. There are a large number of medications to treat HTN. These fall into several categories, and your caregiver will help you select the medicines that are best for you. Medications may have side effects. You should review side effects with your caregiver. If your blood pressure stays high after you have made lifestyle changes or started on medicines,   Your medication(s) may need to be changed.  Other problems may need to be addressed.  Be certain you understand your prescriptions, and know how and when to take your medicine.  Be sure to follow up with your caregiver within the time frame advised (usually within two weeks) to have your blood pressure rechecked and to review your medications.  If you are taking more than one medicine to lower your blood pressure, make sure you know how and at what times they should be taken. Taking two medicines at the same time can result in blood pressure that is too low. SEEK IMMEDIATE MEDICAL CARE IF:  You develop a severe headache, blurred or changing vision, or confusion.  You have unusual weakness or numbness, or a faint feeling.  You have severe chest or abdominal pain, vomiting, or breathing problems. MAKE SURE YOU:   Understand these instructions.  Will watch your condition.  Will get help right away if you are not doing well or get worse. Document Released: 02/24/2005 Document Revised: 05/19/2011 Document Reviewed: 10/15/2007 [employer] Patient Information 2014 [employer]   [employer]. Hypothyroidism The thyroid is a large gland located in the lower front of your neck. The thyroid gland helps control metabolism. Metabolism is how your body handles food. It controls metabolism with the hormone thyroxine. When this gland is underactive (hypothyroid), it produces too little hormone.  CAUSES These include:   Absence or destruction of thyroid  tissue.  Goiter due to iodine deficiency.  Goiter due to medications.  Congenital defects (since birth).  Problems with the pituitary. This causes a lack of TSH (thyroid stimulating hormone). This hormone tells the thyroid to turn out more hormone. SYMPTOMS  Lethargy (feeling as though you have no energy)  Cold intolerance  Weight gain (in spite of normal food intake)  Dry skin  Coarse hair  Menstrual irregularity (if severe, may lead to infertility)  Slowing of thought processes Cardiac problems are also caused by insufficient amounts of thyroid hormone. Hypothyroidism in the newborn is cretinism, and is an extreme form. It is important that this form be treated adequately and immediately or it will lead rapidly to retarded physical and mental development. DIAGNOSIS  To prove hypothyroidism, your caregiver may do blood tests and ultrasound tests. Sometimes the signs are hidden. It may be necessary for your caregiver to watch this illness with blood tests either before or after diagnosis and treatment. TREATMENT  Low levels of thyroid hormone are increased by using synthetic thyroid hormone. This is a safe, effective treatment. It usually takes about four weeks to gain the full effects of the medication. After you have the full effect of the medication, it will generally take another four weeks for problems to leave. Your caregiver may start you on low doses. If you have had heart problems the dose may be gradually increased. It is generally not an emergency to get rapidly to normal. HOME CARE INSTRUCTIONS   Take your medications as your caregiver suggests. Let your caregiver know of any medications you are taking or start taking. Your caregiver will help you with dosage schedules.  As your condition improves, your dosage needs may increase. It will be necessary to have continuing blood tests as suggested by your caregiver.  Report all suspected medication side effects to your  caregiver. SEEK MEDICAL CARE IF: Seek medical care if you develop:  Sweating.  Tremulousness (tremors).  Anxiety.  Rapid weight loss.  Heat intolerance.  Emotional swings.  Diarrhea.  Weakness. SEEK IMMEDIATE MEDICAL CARE IF:  You develop chest pain, an irregular heart beat (palpitations), or a rapid heart beat. MAKE SURE YOU:   Understand these instructions.  Will watch your condition.  Will get help right away if you are not doing well or get worse. Document Released: 02/24/2005 Document Revised: 05/19/2011 Document Reviewed: 10/15/2007 [facility] Patient Information 2014 [place], [place].  Diabetes and Exercise Exercising regularly is important. It is not just about losing weight. It has many health benefits, such as:  Improving your overall fitness, flexibility, and endurance.  Increasing your bone density.  Helping with weight control.  Decreasing your body fat.  Increasing your muscle strength.  Reducing stress and tension.  Improving your overall health. People with diabetes who exercise gain additional benefits because exercise:  Reduces appetite.  Improves the body's use of blood sugar (glucose).  Helps lower or control blood glucose.  Decreases blood pressure.  Helps control blood lipids (such as cholesterol and triglycerides).  Improves the body's use of the hormone insulin by:  Increasing the body's insulin sensitivity.  Reducing the body's insulin needs.  Decreases  the risk for heart disease because exercising:  Lowers cholesterol and triglycerides levels.  Increases the levels of good cholesterol (such as high-density lipoproteins [HDL]) in the body.  Lowers blood glucose levels. YOUR ACTIVITY PLAN  Choose an activity that you enjoy and set realistic goals. Your health care provider or diabetes educator can help you make an activity plan that works for you. You can break activities into 2 or 3 sessions throughout the day. Doing so is  as good as one long session. Exercise ideas include:  Taking the dog for a walk.  Taking the stairs instead of the elevator.  Dancing to your favorite song.  Doing your favorite exercise with a friend. RECOMMENDATIONS FOR EXERCISING WITH TYPE 1 OR TYPE 2 DIABETES   Check your blood glucose before exercising. If blood glucose levels are greater than 240 mg/dL, check for urine ketones. Do not exercise if ketones are present.  Avoid injecting insulin into areas of the body that are going to be exercised. For example, avoid injecting insulin into:  The arms when playing tennis.  The legs when jogging.  Keep a record of:  Food intake before and after you exercise.  Expected peak times of insulin action.  Blood glucose levels before and after you exercise.  The type and amount of exercise you have done.  Review your records with your health care provider. Your health care provider will help you to develop guidelines for adjusting food intake and insulin amounts before and after exercising.  If you take insulin or oral hypoglycemic agents, watch for signs and symptoms of hypoglycemia. They include:  Dizziness.  Shaking.  Sweating.  Chills.  Confusion.  Drink plenty of water while you exercise to prevent dehydration or heat stroke. Body water is lost during exercise and must be replaced.  Talk to your health care provider before starting an exercise program to make sure it is safe for you. Remember, almost any type of activity is better than none. Document Released: 05/17/2003 Document Revised: 10/27/2012 Document Reviewed: 08/03/2012 [facility] Patient Information 2014 [place], [place]. Cholesterol Cholesterol is a white, waxy, fat-like protein needed by your body in small amounts. The liver makes all the cholesterol you need. It is carried from the liver by the blood through the blood vessels. Deposits (plaque) may build up on blood vessel walls. This makes the arteries  narrower and stiffer. Plaque increases the risk for heart attack and stroke. You cannot feel your cholesterol level even if it is very high. The only way to know is by a blood test to check your lipid (fats) levels. Once you know your cholesterol levels, you should keep a record of the test results. Work with your caregiver to to keep your levels in the desired range. WHAT THE RESULTS MEAN:  Total cholesterol is a rough measure of all the cholesterol in your blood.  LDL is the so-called bad cholesterol. This is the type that deposits cholesterol in the walls of the arteries. You want this level to be low.  HDL is the good cholesterol because it cleans the arteries and carries the LDL away. You want this level to be high.  Triglycerides are fat that the body can either burn for energy or store. High levels are closely linked to heart disease. DESIRED LEVELS:  Total cholesterol below 200.  LDL below 100 for people at risk, below 70 for very high risk.  HDL above 50 is good, above 60 is best.  Triglycerides below 150. HOW TO  LOWER YOUR CHOLESTEROL:  Diet.  Choose fish or white meat chicken and [place], roasted or baked. Limit fatty cuts of red meat, fried foods, and processed meats, such as sausage and lunch meat.  Eat lots of fresh fruits and vegetables. Choose whole grains, beans, pasta, potatoes and cereals.  Use only small amounts of olive, corn or canola oils. Avoid butter, mayonnaise, shortening or palm kernel oils. Avoid foods with trans-fats.  Use skim/nonfat milk and low-fat/nonfat yogurt and cheeses. Avoid whole milk, cream, ice cream, egg yolks and cheeses. Healthy desserts include angel food cake, ginger snaps, animal crackers, hard candy, popsicles, and low-fat/nonfat frozen yogurt. Avoid pastries, cakes, pies and cookies.  Exercise.  A regular program helps decrease LDL and raises HDL.  Helps with weight control.  Do things that increase your activity level like  gardening, walking, or taking the stairs.  Medication.  May be prescribed by your caregiver to help lowering cholesterol and the risk for heart disease.  You may need medicine even if your levels are normal if you have several risk factors. HOME CARE INSTRUCTIONS   Follow your diet and exercise programs as suggested by your caregiver.  Take medications as directed.  Have blood work done when your caregiver feels it is necessary. MAKE SURE YOU:   Understand these instructions.  Will watch your condition.  Will get help right away if you are not doing well or get worse. Document Released: 11/19/2000 Document Revised: 05/19/2011 Document Reviewed: 05/12/2007 [facility] Patient Information 2014 [place], [place]. Vitamin D Deficiency Vitamin D is an important vitamin that your body needs. Having too little of it in your body is called a deficiency. A very bad deficiency can make your bones soft and can cause a condition called rickets.  Vitamin D is important to your body for different reasons, such as:   It helps your body absorb 2 minerals called calcium and phosphorus.  It helps make your bones healthy.  It may prevent some diseases, such as diabetes and multiple sclerosis.  It helps your muscles and heart. You can get vitamin D in several ways. It is a natural part of some foods. The vitamin is also added to some dairy products and cereals. Some people take vitamin D supplements. Also, your body makes vitamin D when you are in the sun. It changes the sun's rays into a form of the vitamin that your body can use. CAUSES   Not eating enough foods that contain vitamin D.  Not getting enough sunlight.  Having certain digestive system diseases that make it hard to absorb vitamin D. These diseases include Crohn's disease, chronic pancreatitis, and cystic fibrosis.  Having a surgery in which part of the stomach or small intestine is removed.  Being obese. Fat cells pull vitamin D out  of your blood. That means that obese people may not have enough vitamin D left in their blood and in other body tissues.  Having chronic kidney or liver disease. RISK FACTORS Risk factors are things that make you more likely to develop a vitamin D deficiency. They include:  Being older.  Not being able to get outside very much.  Living in a nursing home.  Having had broken bones.  Having weak or thin bones (osteoporosis).  Having a disease or condition that changes how your body absorbs vitamin D.  Having dark skin.  Some medicines such as seizure medicines or steroids.  Being overweight or obese. SYMPTOMS Mild cases of vitamin D deficiency may not have  any symptoms. If you have a very bad case, symptoms may include:  Bone pain.  Muscle pain.  Falling often.  Broken bones caused by a minor injury, due to osteoporosis. DIAGNOSIS A blood test is the best way to tell if you have a vitamin D deficiency. TREATMENT Vitamin D deficiency can be treated in different ways. Treatment for vitamin D deficiency depends on what is causing it. Options include:  Taking vitamin D supplements.  Taking a calcium supplement. Your caregiver will suggest what dose is best for you. HOME CARE INSTRUCTIONS  Take any supplements that your caregiver prescribes. Follow the directions carefully. Take only the suggested amount.  Have your blood tested 2 months after you start taking supplements.  Eat foods that contain vitamin D. Healthy choices include:  Fortified dairy products, cereals, or juices. Fortified means vitamin D has been added to the food. Check the label on the package to be sure.  Fatty fish like salmon or trout.  Eggs.  Oysters.  Do not use a tanning bed.  Keep your weight at a healthy level. Lose weight if you need to.  Keep all follow-up appointments. Your caregiver will need to perform blood tests to make sure your vitamin D deficiency is going away. SEEK MEDICAL  CARE IF:  You have any questions about your treatment.  You continue to have symptoms of vitamin D deficiency.  You have nausea or vomiting.  You are constipated.  You feel confused.  You have severe abdominal or back pain. MAKE SURE YOU:  Understand these instructions.  Will watch your condition.  Will get help right away if you are not doing well or get worse. Document Released: 05/19/2011 Document Revised: 06/21/2012 Document Reviewed: 05/19/2011 [facility] Patient Information 2014 [place], [place].

## 2013-03-06 NOTE — Progress Notes (Signed)
Patient ID: [name], female   DOB: 12/21/1950, 62 y.o.   MRN: [id number]   This very nice 62 y.o.MWF presents for 3 month follow up with Hypertension, Hyperlipidemia, Pre-Diabetes and Vitamin D Deficiency.     Hypertension predates since 72. BP has been controlled at home. Today's BP: 124/76 mmHg . In August BUN/Creat was 18/1.10 and decreased GFR 54 in moderate renal insufficiency range. Patient denies any cardiac type chest pain, palpitations, dyspnea/orthopnea/PND, dizziness, claudication, or dependent edema.   Hyperlipidemia is controlled with diet & meds. Last Cholesterol was  187, Triglycerides were 119, HDL 68 and LDL 95 - all at goal in April. Patient denies myalgias or other med SE's. In addition, Patient has compensated Hypothyroidism.   Other problems include history of PreDiabetes since April 2012 with A1c 5.8% with most recent A1c of 5.7% in April. Patient denies any symptoms of reactive hypoglycemia, diabetic polys, paresthesias or visual blurring. The patient sees Dr Anne Hahn for Essential Tremor. Patient did have a protracted hospitalization in 2012 failed Gastric Bipass lap band surgery by Dr Daphine Deutscher which was complicated by wound dehiscence. 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   Further, Patient has history of Vitamin D Deficiency with last vitamin D of 77 in April (was 24 in 2008). Patient supplements vitamin D without any suspected side-effects.  Medication Sig Dispense Refill  . ALPRAZolam (XANAX) 1 MG tablet Take 1 mg by mouth at bedtime as needed for sleep. One tablet at night, 1/2 tablet twice a day as needed      . aspirin 81 MG tablet Take 81 mg by mouth daily.      . Biotin 1 MG CAPS Take 1 capsule by mouth daily.      . bumetanide (BUMEX) 2 MG tablet Take 2 mg by mouth daily.      [name] Kitchen buPROPion (WELLBUTRIN XL) 150 MG 24 hr tablet Take 150 mg by mouth daily.       . Cholecalciferol (VITAMIN  D3) 10000 UNITS capsule Take 10,000 Units by mouth daily.      Burman Blacksmith IBUPROFEN 200 MG CAPS Take 200 mg by mouth at bedtime. Take two pills at bedtime      . FLUoxetine (PROZAC) 20 MG capsule Take 20 mg by mouth daily.       [name] Kitchen FLUoxetine (PROZAC) 40 MG capsule Take 40 mg by mouth daily.      [name] Kitchen levothyroxine (SYNTHROID, LEVOTHROID) 50 MCG tablet Take 50 mcg by mouth daily before breakfast.      . pantoprazole (PROTONIX) 40 MG tablet Take 1 tablet (40 mg total) by mouth daily.  30 tablet  1  . potassium chloride SA (K-DUR,KLOR-CON) 20 MEQ tablet Take 20 mEq by mouth daily. 1/2 tablet daily      . pravastatin (PRAVACHOL) 40 MG tablet Take 40 mg by mouth daily.      . primidone (MYSOLINE) 250 MG tablet Take 250 mg by mouth 2 (two) times daily.      [name] Kitchen PROBIOTIC CAPS Take 1 capsule by mouth daily.      . vitamin B-12 (CYANOCOBALAMIN) 100 MCG tablet Take 50 mcg by mouth daily.         Allergies  Allergen Reactions  . Enalapril Cough  . Quinapril   . Trazodone And Nefazodone   . Ancef [Cefazolin]   . Ivp Dye [Iodinated Diagnostic Agents]   . Penicillins     PMHx:  Past Medical History  Diagnosis Date  . Tremor   . Cerebrovascular disease   . Depression with anxiety   . Diabetes   . Dyslipidemia   . Hypertension   . Obesity   . Hypothyroidism   . GERD (gastroesophageal reflux disease)   . Hyperlipidemia   . Pre-diabetes   . Anemia   . Vitamin D deficiency   . Obesity     FHx:    Reviewed / unchanged  SHx:    Reviewed / unchanged  Systems Review: Constitutional: Denies fever, chills, wt changes, headaches, insomnia, fatigue, night sweats, change in appetite. Eyes: Denies redness, blurred vision, diplopia, discharge, itchy, watery eyes.  ENT: Denies discharge, congestion, post nasal drip, epistaxis, sore throat, earache, hearing loss, dental pain, tinnitus, vertigo, sinus pain, snoring.  CV: Denies chest pain, palpitations, irregular heartbeat, syncope, dyspnea, diaphoresis,  orthopnea, PND, claudication, edema. Respiratory: denies cough, dyspnea, DOE, pleurisy, hoarseness, laryngitis, wheezing.  Gastrointestinal: Denies dysphagia, odynophagia, heartburn, reflux, water brash, abdominal pain or cramps, nausea, vomiting, bloating, diarrhea, constipation, hematemesis, melena, hematochezia,  or hemorrhoids. Genitourinary: Denies dysuria, frequency, urgency, nocturia, hesitancy, discharge, hematuria, flank pain. Musculoskeletal: Denies arthralgias, myalgias, stiffness, jt. swelling, pain, limp, strain/sprain.  Skin: Denies pruritus, rash, hives, warts, acne, eczema, change in skin lesion(s). Neuro: No weakness, incoordination, spasms, paresthesia, or pain. She does have Essential Tremor.  Psychiatric: Denies confusion, memory loss, or sensory loss. Endo: Denies change in weight, skin, hair change.  Heme/Lymph: No excessive bleeding, bruising, orenlarged lymph nodes.  Filed Vitals:   03/07/13 1111  BP: 124/76  Pulse: 76  Temp: 97.7 F (36.5 C)  Resp: 16    Estimated body mass index is 38.8 kg/(m^2) as calculated from the following:   Height as of 02/07/13: 5\' 3"  (1.6 m).   Weight as of this encounter: 219 lb (99.338 kg).  On Exam: Appears well nourished - in no distress. Eyes: PERRLA, EOMs, conjunctiva no swelling or erythema. Sinuses: No frontal/maxillary tenderness ENT/Mouth: EAC's clear, TM's nl w/o erythema, bulging. Nares clear w/o erythema, swelling, exudates. Oropharynx clear without erythema or exudates. Oral hygiene is good. Tongue normal, non obstructing. Hearing intact.  Neck: Supple. Thyroid nl. Car 2+/2+ without bruits, nodes or JVD. Chest: Respirations nl with BS clear & equal w/o rales, rhonchi, wheezing or stridor.  Cor: Heart sounds normal w/ regular rate and rhythm without sig. murmurs, gallops, clicks, or rubs. Peripheral pulses normal and equal  without edema.  Abdomen: Soft & bowel sounds normal. Non-tender w/o guarding, rebound, hernias,  masses, or organomegaly.  Lymphatics: Unremarkable.  Musculoskeletal: Full ROM all peripheral extremities, joint stability, 5/5 strength, and normal gait.  Skin: Warm, dry without exposed rashes, lesions, ecchymosis apparent.  Neuro: Cranial nerves intact, reflexes equal bilaterally. Sensory-motor testing grossly intact. Tendon reflexes grossly intact.  Pysch: Alert & oriented x 3. Insight and judgement nl & appropriate. No ideations.  Assessment and Plan:  1. Hypertension  w/ moderate Renal insufficiency - Continue monitor blood pressure at home. Continue diet/meds same.  2. Hyperlipidemia - Continue diet/meds, exercise,& lifestyle modifications. Continue monitor periodic cholesterol/liver & renal functions   3. Pre-diabetes & Morbid Obesity ( BMI 37) - Continue diet/weight loss,  exercise, lifestyle modifications. Monitor appropriate labs.  4. Vitamin D Deficiency - Continue supplementation.  5. Tremor  6. Hypothyroidism  7. Tracheal Stenosis  Recommended regular exercise, BP monitoring, weight control, and discussed med and SE's. Recommended labs to assess and monitor clinical status. Further disposition pending results of labs.

## 2013-03-07 ENCOUNTER — Ambulatory Visit (INDEPENDENT_AMBULATORY_CARE_PROVIDER_SITE_OTHER): Payer: Medicare HMO | Admitting: Internal Medicine

## 2013-03-07 ENCOUNTER — Ambulatory Visit: Payer: Self-pay | Admitting: Internal Medicine

## 2013-03-07 ENCOUNTER — Encounter: Payer: Self-pay | Admitting: Internal Medicine

## 2013-03-07 VITALS — BP 124/76 | HR 76 | Temp 97.7°F | Resp 16 | Wt 219.0 lb

## 2013-03-07 DIAGNOSIS — E559 Vitamin D deficiency, unspecified: Secondary | ICD-10-CM

## 2013-03-07 DIAGNOSIS — I10 Essential (primary) hypertension: Secondary | ICD-10-CM

## 2013-03-07 DIAGNOSIS — E039 Hypothyroidism, unspecified: Secondary | ICD-10-CM

## 2013-03-07 DIAGNOSIS — E782 Mixed hyperlipidemia: Secondary | ICD-10-CM

## 2013-03-07 DIAGNOSIS — R7309 Other abnormal glucose: Secondary | ICD-10-CM

## 2013-03-07 DIAGNOSIS — R7303 Prediabetes: Secondary | ICD-10-CM | POA: Insufficient documentation

## 2013-03-07 DIAGNOSIS — K2100 Gastro-esophageal reflux disease with esophagitis, without bleeding: Secondary | ICD-10-CM

## 2013-03-07 DIAGNOSIS — G250 Essential tremor: Secondary | ICD-10-CM

## 2013-03-07 DIAGNOSIS — Z79899 Other long term (current) drug therapy: Secondary | ICD-10-CM

## 2013-03-07 LAB — CBC WITH DIFFERENTIAL/PLATELET
Basophils Absolute: 0 10*3/uL (ref 0.0–0.1)
Eosinophils Absolute: 0.1 10*3/uL (ref 0.0–0.7)
Eosinophils Relative: 3 % (ref 0–5)
HCT: 33 % — ABNORMAL LOW (ref 36.0–46.0)
Hemoglobin: 10.8 g/dL — ABNORMAL LOW (ref 12.0–15.0)
Lymphocytes Relative: 32 % (ref 12–46)
MCH: 26.3 pg (ref 26.0–34.0)
MCHC: 32.7 g/dL (ref 30.0–36.0)
MCV: 80.3 fL (ref 78.0–100.0)
Monocytes Absolute: 0.3 10*3/uL (ref 0.1–1.0)
RDW: 14.1 % (ref 11.5–15.5)
WBC: 4.3 10*3/uL (ref 4.0–10.5)

## 2013-03-07 LAB — BASIC METABOLIC PANEL WITH GFR
BUN: 14 mg/dL (ref 6–23)
CO2: 25 mEq/L (ref 19–32)
Calcium: 8.7 mg/dL (ref 8.4–10.5)
Chloride: 104 mEq/L (ref 96–112)
Creat: 1 mg/dL (ref 0.50–1.10)
GFR, Est African American: 69 mL/min
GFR, Est Non African American: 60 mL/min
Sodium: 139 mEq/L (ref 135–145)

## 2013-03-07 LAB — HEPATIC FUNCTION PANEL
AST: 20 U/L (ref 0–37)
Albumin: 3.8 g/dL (ref 3.5–5.2)
Alkaline Phosphatase: 112 U/L (ref 39–117)
Total Bilirubin: 0.3 mg/dL (ref 0.3–1.2)
Total Protein: 6 g/dL (ref 6.0–8.3)

## 2013-03-07 LAB — LIPID PANEL
Cholesterol: 182 mg/dL (ref 0–200)
HDL: 67 mg/dL (ref >39)
LDL Cholesterol: 93 mg/dL (ref 0–99)

## 2013-03-07 LAB — HEMOGLOBIN A1C: Hgb A1c MFr Bld: 6.6 % — ABNORMAL HIGH (ref <5.7)

## 2013-03-07 LAB — TSH: TSH: 1.17 u[IU]/mL (ref 0.350–4.500)

## 2013-03-08 LAB — VITAMIN D 25 HYDROXY (VIT D DEFICIENCY, FRACTURES): Vit D, 25-Hydroxy: 74 ng/mL (ref 30–89)

## 2013-03-08 LAB — INSULIN, FASTING: Insulin fasting, serum: 13 u[IU]/mL (ref 3–28)

## 2013-03-08 LAB — PRIMIDONE AND METABOLITE LEVEL: Phenobarbital: 16.6 ug/mL (ref 15.0–40.0)

## 2013-03-10 ENCOUNTER — Other Ambulatory Visit: Payer: Self-pay | Admitting: Internal Medicine

## 2013-03-10 ENCOUNTER — Other Ambulatory Visit: Payer: Self-pay | Admitting: Physician Assistant

## 2013-03-11 ENCOUNTER — Other Ambulatory Visit: Payer: Self-pay | Admitting: Physician Assistant

## 2013-03-11 DIAGNOSIS — D649 Anemia, unspecified: Secondary | ICD-10-CM

## 2013-03-11 DIAGNOSIS — E538 Deficiency of other specified B group vitamins: Secondary | ICD-10-CM

## 2013-03-11 MED ORDER — LEVOTHYROXINE SODIUM 50 MCG PO TABS: 50.0000 ug | ORAL_TABLET | Freq: Every day | ORAL | Status: DC

## 2013-03-14 ENCOUNTER — Other Ambulatory Visit: Payer: Self-pay | Admitting: Physician Assistant

## 2013-03-14 ENCOUNTER — Other Ambulatory Visit: Payer: Commercial Managed Care - HMO

## 2013-03-14 DIAGNOSIS — D649 Anemia, unspecified: Secondary | ICD-10-CM

## 2013-03-14 DIAGNOSIS — E538 Deficiency of other specified B group vitamins: Secondary | ICD-10-CM

## 2013-03-14 LAB — CBC WITH DIFFERENTIAL/PLATELET
BASOS ABS: 0 10*3/uL (ref 0.0–0.1)
Basophils Relative: 1 % (ref 0–1)
EOS ABS: 0.1 10*3/uL (ref 0.0–0.7)
Eosinophils Relative: 3 % (ref 0–5)
HCT: 33.1 % — ABNORMAL LOW (ref 36.0–46.0)
Hemoglobin: 10.7 g/dL — ABNORMAL LOW (ref 12.0–15.0)
LYMPHS ABS: 1.6 10*3/uL (ref 0.7–4.0)
Lymphocytes Relative: 34 % (ref 12–46)
MCH: 25.4 pg — AB (ref 26.0–34.0)
MCHC: 32.3 g/dL (ref 30.0–36.0)
MCV: 78.6 fL (ref 78.0–100.0)
Monocytes Absolute: 0.4 10*3/uL (ref 0.1–1.0)
Monocytes Relative: 9 % (ref 3–12)
NEUTROS PCT: 53 % (ref 43–77)
Neutro Abs: 2.6 10*3/uL (ref 1.7–7.7)
PLATELETS: 215 10*3/uL (ref 150–400)
RBC: 4.21 MIL/uL (ref 3.87–5.11)
RDW: 14.3 % (ref 11.5–15.5)
WBC: 4.8 10*3/uL (ref 4.0–10.5)

## 2013-03-14 LAB — IRON AND TIBC
%SAT: 9 % — ABNORMAL LOW (ref 20–55)
Iron: 32 ug/dL — ABNORMAL LOW (ref 42–145)
TIBC: 339 ug/dL (ref 250–470)
UIBC: 307 ug/dL (ref 125–400)

## 2013-03-14 LAB — FERRITIN: Ferritin: 4 ng/mL — ABNORMAL LOW (ref 10–291)

## 2013-03-14 LAB — RETICULOCYTES
ABS Retic: 63.2 10*3/uL (ref 19.0–186.0)
RBC.: 4.21 MIL/uL (ref 3.87–5.11)
Retic Ct Pct: 1.5 % (ref 0.4–2.3)

## 2013-03-14 LAB — VITAMIN B12: VITAMIN B 12: 396 pg/mL (ref 211–911)

## 2013-03-15 LAB — FOLATE RBC: RBC Folate: 831 ng/mL (ref >280)

## 2013-03-16 ENCOUNTER — Telehealth: Payer: Self-pay

## 2013-03-16 NOTE — Telephone Encounter (Signed)
Pt called asking for lab results and asking about the status of GI referral. Labs are still being reviewed and GI referral has been faxed to [facility] and we are waiting for decision. Pt aware.

## 2013-03-17 ENCOUNTER — Telehealth: Payer: Self-pay | Admitting: *Deleted

## 2013-03-17 ENCOUNTER — Encounter: Payer: Self-pay | Admitting: Internal Medicine

## 2013-03-17 NOTE — Telephone Encounter (Signed)
Message copied by Emelda Brothers on Thu Mar 17, 2013 10:03 AM ------      Message from: Unk Pinto      Created: Thu Mar 17, 2013  1:47 AM       B12 and folate Nl ok       Iron is very low - recc see GI for consult to determine where losing blood - need to start OTC iron tabs bid or tid after meal and recheck CBC in 1 month - and need to set up GI consult ------

## 2013-03-22 ENCOUNTER — Telehealth: Payer: Self-pay

## 2013-03-22 NOTE — Telephone Encounter (Signed)
Referral to see Dr.Schooler & Dr.Edwards have been approved. Pt aware.

## 2013-03-31 ENCOUNTER — Other Ambulatory Visit: Payer: Self-pay | Admitting: Gastroenterology

## 2013-04-01 ENCOUNTER — Other Ambulatory Visit: Payer: Self-pay | Admitting: Physician Assistant

## 2013-04-04 ENCOUNTER — Other Ambulatory Visit: Payer: Self-pay | Admitting: Physician Assistant

## 2013-04-04 MED ORDER — ALPRAZOLAM 1 MG PO TABS: ORAL_TABLET | ORAL | Status: DC

## 2013-04-13 ENCOUNTER — Telehealth: Payer: Self-pay | Admitting: *Deleted

## 2013-04-13 NOTE — Telephone Encounter (Signed)
Patient called.  States she got results of endoscopy done by Dr Michail Sermon.  Report did not say why she is having anemia and also having nausea since procedure.  Per Dr Melford Aase, pt needs to schedule appointment with Dr Michail Sermon to discuss nausea and nurse visit here to check cbc with diff and retic count(dx-285.9)  Pt says she called Dr Michail Sermon.s office and was told to stop Ibuprophen at bedtime andb try brand Advil. NV scheduled and may need hematology apponitment pending results.

## 2013-04-15 ENCOUNTER — Ambulatory Visit (INDEPENDENT_AMBULATORY_CARE_PROVIDER_SITE_OTHER): Payer: Medicare HMO | Admitting: *Deleted

## 2013-04-15 DIAGNOSIS — D649 Anemia, unspecified: Secondary | ICD-10-CM

## 2013-04-16 LAB — CBC WITH DIFFERENTIAL/PLATELET
BASOS ABS: 0 10*3/uL (ref 0.0–0.1)
Basophils Relative: 1 % (ref 0–1)
Eosinophils Absolute: 0.2 10*3/uL (ref 0.0–0.7)
Eosinophils Relative: 4 % (ref 0–5)
HEMATOCRIT: 34.1 % — AB (ref 36.0–46.0)
Hemoglobin: 10.7 g/dL — ABNORMAL LOW (ref 12.0–15.0)
LYMPHS PCT: 29 % (ref 12–46)
Lymphs Abs: 1.3 10*3/uL (ref 0.7–4.0)
MCH: 24.4 pg — ABNORMAL LOW (ref 26.0–34.0)
MCHC: 31.4 g/dL (ref 30.0–36.0)
MCV: 77.9 fL — ABNORMAL LOW (ref 78.0–100.0)
MONO ABS: 0.3 10*3/uL (ref 0.1–1.0)
Monocytes Relative: 7 % (ref 3–12)
NEUTROS PCT: 59 % (ref 43–77)
Neutro Abs: 2.6 10*3/uL (ref 1.7–7.7)
PLATELETS: 199 10*3/uL (ref 150–400)
RBC: 4.38 MIL/uL (ref 3.87–5.11)
RDW: 14.5 % (ref 11.5–15.5)
WBC: 4.3 10*3/uL (ref 4.0–10.5)

## 2013-04-16 LAB — RETICULOCYTES
ABS RETIC: 43.8 10*3/uL (ref 19.0–186.0)
RBC.: 4.38 MIL/uL (ref 3.87–5.11)
RETIC CT PCT: 1 % (ref 0.4–2.3)

## 2013-05-03 ENCOUNTER — Ambulatory Visit: Payer: Self-pay | Admitting: Internal Medicine

## 2013-05-09 ENCOUNTER — Ambulatory Visit (INDEPENDENT_AMBULATORY_CARE_PROVIDER_SITE_OTHER): Payer: Medicare HMO | Admitting: Internal Medicine

## 2013-05-09 ENCOUNTER — Encounter: Payer: Self-pay | Admitting: Internal Medicine

## 2013-05-09 VITALS — BP 106/78 | HR 76 | Temp 97.7°F | Resp 16 | Ht 63.5 in | Wt 213.2 lb

## 2013-05-09 DIAGNOSIS — Q321 Other congenital malformations of trachea: Secondary | ICD-10-CM

## 2013-05-09 DIAGNOSIS — K219 Gastro-esophageal reflux disease without esophagitis: Secondary | ICD-10-CM

## 2013-05-09 DIAGNOSIS — R5383 Other fatigue: Principal | ICD-10-CM

## 2013-05-09 DIAGNOSIS — Q318 Other congenital malformations of larynx: Secondary | ICD-10-CM

## 2013-05-09 DIAGNOSIS — I10 Essential (primary) hypertension: Secondary | ICD-10-CM

## 2013-05-09 DIAGNOSIS — R5381 Other malaise: Secondary | ICD-10-CM

## 2013-05-09 DIAGNOSIS — Z8719 Personal history of other diseases of the digestive system: Secondary | ICD-10-CM

## 2013-05-09 DIAGNOSIS — Z87898 Personal history of other specified conditions: Secondary | ICD-10-CM

## 2013-05-09 DIAGNOSIS — Q349 Congenital malformation of respiratory system, unspecified: Secondary | ICD-10-CM

## 2013-05-09 DIAGNOSIS — K439 Ventral hernia without obstruction or gangrene: Secondary | ICD-10-CM

## 2013-05-09 NOTE — Progress Notes (Signed)
Subjective:    Patient ID: [name], female    DOB: [birth year], 63 y.o.   MRN: [id number]  HPI [name] complains of overwhelming sleepiness and fatigue and accomplishes "Nothing all day" .     Medication List       ALPRAZolam 1 MG tablet  Commonly known as:  XANAX  One tablet at night, 1/2 tablet twice a day as needed     aspirin 81 MG tablet  Take 81 mg by mouth daily.     Biotin 1 MG Caps  Take 1 capsule by mouth daily.     bumetanide 2 MG tablet  Commonly known as:  BUMEX  TAKE 1 TABLET EVERY DAY  - SUBSTITUTED FOR  BUMEX     buPROPion 150 MG 24 hr tablet  Commonly known as:  WELLBUTRIN XL  Take 150 mg by mouth daily.     EQ IBUPROFEN 200 MG Caps  Generic drug:  Ibuprofen  Take 200 mg by mouth at bedtime. Take two pills at bedtime     esomeprazole 40 MG capsule  Commonly known as:  NEXIUM  Take 40 mg by mouth 2 (two) times daily before a meal.     FLUoxetine 40 MG capsule  Commonly known as:  PROZAC  TAKE 1 CAPSULE EVERY DAY  FOR  MOOD     FLUoxetine 20 MG capsule  Commonly known as:  PROZAC  TAKE 1 CAPSULE DAILY     levothyroxine 50 MCG tablet  Commonly known as:  SYNTHROID, LEVOTHROID  Take 1 tablet (50 mcg total) by mouth daily before breakfast.     potassium chloride SA 20 MEQ tablet  Commonly known as:  K-DUR,KLOR-CON  Take 20 mEq by mouth daily. 1/2 tablet daily     pravastatin 40 MG tablet  Commonly known as:  PRAVACHOL  TAKE 1 TABLET AT BEDTIME FOR CHOLESTEROL     primidone 250 MG tablet  Commonly known as:  MYSOLINE  TAKE 1 TABLET TWICE DAILY     Probiotic Caps  Take 2 capsules by mouth daily.     vitamin B-12 100 MCG tablet  Commonly known as:  CYANOCOBALAMIN  Take 50 mcg by mouth daily.     Vitamin D3 10000 UNITS capsule  Take 10,000 Units by mouth daily.       Allergies  Allergen Reactions  . Enalapril Cough  . Quinapril   . Trazodone And Nefazodone   . Ancef [Cefazolin]   . Ivp Dye [Iodinated Diagnostic Agents]   .  Penicillins    Past Medical History  Diagnosis Date  . Tremor   . Cerebrovascular disease   . Depression with anxiety   . Diabetes   . Dyslipidemia   . Hypertension   . Obesity   . Hypothyroidism   . GERD (gastroesophageal reflux disease)   . Hyperlipidemia   . Pre-diabetes   . Anemia   . Vitamin D deficiency   . Obesity    Review of Systems  Constitutional: Positive for fatigue. Negative for fever, chills, diaphoresis, activity change, appetite change and unexpected weight change.  HENT: Negative.   Eyes: Negative.   Respiratory: Negative.   Cardiovascular: Negative.   Gastrointestinal: Negative.   Endocrine: Negative.   Genitourinary: Negative.   Neurological: Positive for tremors.  Hematological: Negative.   Psychiatric/Behavioral: Negative.    BP: 106/78  Pulse: 76  Temp: 97.7 F (36.5 C)  Resp: 16     Objective:   Physical Exam  Constitutional: She  is oriented to person, place, and time. No distress.  Over nourished. Lethargic.  HENT:  Head: Atraumatic.  Right Ear: External ear normal.  Left Ear: External ear normal.  Mouth/Throat: Oropharynx is clear and moist. No oropharyngeal exudate.  Eyes: EOM are normal. Pupils are equal, round, and reactive to light. No scleral icterus.  Neck: Normal range of motion. Neck supple. No JVD present. No thyromegaly present.  Cardiovascular: Normal rate, regular rhythm and normal heart sounds.   No murmur heard. Pulmonary/Chest: Effort normal and breath sounds normal. No respiratory distress. She has no wheezes. She has no rales.  Abdominal: Soft. Bowel sounds are normal. She exhibits no distension. There is no tenderness. There is no rebound.  Musculoskeletal: Normal range of motion. She exhibits no edema.  Lymphadenopathy:    She has no cervical adenopathy.  Neurological: She is oriented to person, place, and time. She has normal reflexes. No cranial nerve deficit. She exhibits normal muscle tone. Coordination normal.   Slight high frequency low amplitude tremor of the hands.  Skin: Skin is warm and dry. No rash noted. She is not diaphoretic. No erythema. No pallor.  Psychiatric: She has a normal mood and affect. Thought content normal.   Assessment & Plan:   1. Other malaise and fatigue  Recc taper Prozac to 20 mg qd  X 2 weeks and if still overly tired to cut back to qod. Also given Sx's Nuvigil 150 mg # 28 to try 1 qd with precaution that it may exacerbate her tremor.  2. Morbid Obesity (BMI 37) and Deconditioning   ROV 3-4 weeks or call if problems or questions.

## 2013-05-09 NOTE — Patient Instructions (Signed)
  Stop the Prozac / Fluoxetine 40 mg  Continue the Prozac / Fluoxetine 20 mg daily for 2 weeks , then if still lethargic  Cut the Prozac / Fluoxetine down to every other day   ------------------------------------------------------------------------------------------------------------------  Samples of Nuvigil 150 mg each am

## 2013-05-11 ENCOUNTER — Other Ambulatory Visit: Payer: Self-pay

## 2013-05-11 ENCOUNTER — Other Ambulatory Visit: Payer: Self-pay | Admitting: Physician Assistant

## 2013-05-11 MED ORDER — BUPROPION HCL ER (XL) 150 MG PO TB24: 150.0000 mg | ORAL_TABLET | Freq: Every day | ORAL | Status: DC

## 2013-05-11 MED ORDER — ARMODAFINIL 150 MG PO TABS: 150.0000 mg | ORAL_TABLET | Freq: Every day | ORAL | Status: DC

## 2013-05-11 MED ORDER — ALPRAZOLAM 1 MG PO TABS: ORAL_TABLET | ORAL | Status: DC

## 2013-05-31 ENCOUNTER — Other Ambulatory Visit: Payer: Self-pay | Admitting: Internal Medicine

## 2013-05-31 DIAGNOSIS — G4711 Idiopathic hypersomnia with long sleep time: Secondary | ICD-10-CM

## 2013-05-31 MED ORDER — METHYLPHENIDATE HCL 20 MG PO TABS: ORAL_TABLET | ORAL | Status: DC

## 2013-06-01 ENCOUNTER — Telehealth: Payer: Self-pay | Admitting: *Deleted

## 2013-06-01 NOTE — Telephone Encounter (Signed)
Spoke with patient. Informed her Nuvigil authorization denied by [facility]. RX for Ritalin to patient pick-up per Dr Melford Aase.

## 2013-06-13 ENCOUNTER — Other Ambulatory Visit: Payer: Self-pay

## 2013-06-13 DIAGNOSIS — Z1231 Encounter for screening mammogram for malignant neoplasm of breast: Secondary | ICD-10-CM

## 2013-06-16 ENCOUNTER — Encounter: Payer: Self-pay | Admitting: Internal Medicine

## 2013-06-16 ENCOUNTER — Ambulatory Visit (INDEPENDENT_AMBULATORY_CARE_PROVIDER_SITE_OTHER): Payer: Medicare HMO | Admitting: Internal Medicine

## 2013-06-16 VITALS — BP 126/68 | HR 64 | Temp 97.5°F | Resp 16 | Ht 64.0 in | Wt 206.2 lb

## 2013-06-16 DIAGNOSIS — R7401 Elevation of levels of liver transaminase levels: Secondary | ICD-10-CM

## 2013-06-16 DIAGNOSIS — I10 Essential (primary) hypertension: Secondary | ICD-10-CM

## 2013-06-16 DIAGNOSIS — Z79899 Other long term (current) drug therapy: Secondary | ICD-10-CM

## 2013-06-16 DIAGNOSIS — Z1212 Encounter for screening for malignant neoplasm of rectum: Secondary | ICD-10-CM

## 2013-06-16 DIAGNOSIS — Z113 Encounter for screening for infections with a predominantly sexual mode of transmission: Secondary | ICD-10-CM

## 2013-06-16 DIAGNOSIS — G4710 Hypersomnia, unspecified: Secondary | ICD-10-CM

## 2013-06-16 DIAGNOSIS — Z0000 Encounter for general adult medical examination without abnormal findings: Secondary | ICD-10-CM

## 2013-06-16 DIAGNOSIS — E559 Vitamin D deficiency, unspecified: Secondary | ICD-10-CM

## 2013-06-16 DIAGNOSIS — Z1211 Encounter for screening for malignant neoplasm of colon: Secondary | ICD-10-CM | POA: Insufficient documentation

## 2013-06-16 DIAGNOSIS — E782 Mixed hyperlipidemia: Secondary | ICD-10-CM

## 2013-06-16 DIAGNOSIS — Z789 Other specified health status: Secondary | ICD-10-CM

## 2013-06-16 DIAGNOSIS — F909 Attention-deficit hyperactivity disorder, unspecified type: Secondary | ICD-10-CM

## 2013-06-16 DIAGNOSIS — R740 Nonspecific elevation of levels of transaminase and lactic acid dehydrogenase [LDH]: Secondary | ICD-10-CM

## 2013-06-16 DIAGNOSIS — R7309 Other abnormal glucose: Secondary | ICD-10-CM

## 2013-06-16 DIAGNOSIS — Z1331 Encounter for screening for depression: Secondary | ICD-10-CM

## 2013-06-16 DIAGNOSIS — R7402 Elevation of levels of lactic acid dehydrogenase (LDH): Secondary | ICD-10-CM

## 2013-06-16 LAB — CBC WITH DIFFERENTIAL/PLATELET
BASOS ABS: 0.1 10*3/uL (ref 0.0–0.1)
Basophils Relative: 1 % (ref 0–1)
Eosinophils Absolute: 0.2 10*3/uL (ref 0.0–0.7)
Eosinophils Relative: 3 % (ref 0–5)
HCT: 44.1 % (ref 36.0–46.0)
Hemoglobin: 15 g/dL (ref 12.0–15.0)
LYMPHS ABS: 1.9 10*3/uL (ref 0.7–4.0)
Lymphocytes Relative: 35 % (ref 12–46)
MCH: 28.2 pg (ref 26.0–34.0)
MCHC: 34 g/dL (ref 30.0–36.0)
MCV: 83.1 fL (ref 78.0–100.0)
Monocytes Absolute: 0.4 10*3/uL (ref 0.1–1.0)
Monocytes Relative: 7 % (ref 3–12)
NEUTROS ABS: 3 10*3/uL (ref 1.7–7.7)
Neutrophils Relative %: 54 % (ref 43–77)
PLATELETS: 183 10*3/uL (ref 150–400)
RBC: 5.31 MIL/uL — AB (ref 3.87–5.11)
RDW: 21.9 % — AB (ref 11.5–15.5)
WBC: 5.5 10*3/uL (ref 4.0–10.5)

## 2013-06-16 LAB — HEMOGLOBIN A1C
Hgb A1c MFr Bld: 5.5 % (ref <5.7)
Mean Plasma Glucose: 111 mg/dL (ref <117)

## 2013-06-16 MED ORDER — AMPHETAMINE-DEXTROAMPHETAMINE 30 MG PO TABS: ORAL_TABLET | ORAL | Status: DC

## 2013-06-16 NOTE — Patient Instructions (Signed)

## 2013-06-16 NOTE — Progress Notes (Signed)
Patient ID: [name], female   DOB: Mar 15, 1950, 63 y.o.   MRN: [id number]   Annual Screening Comprehensive Examination  This very nice 63 y.o. MWF presents for complete physical.  Patient has been followed for HTN, Morbid Obesity,  Prediabetes, Hyperlipidemia, and Vitamin D Deficiency.    HTN predates since 68. Patient's BP has been controlled at home. Today's BP: 126/68 mmHg. Patient denies any cardiac symptoms as chest pain, palpitations, shortness of breath, dizziness or ankle swelling.   Patient's hyperlipidemia is controlled with diet and medications. Patient denies myalgias or other medication SE's. Last cholesterol last visit was 182, triglycerides 111, HDL 67 and LDL 93 in Dec 2014 - all at goal.     Patient hasMorbid Obesity (BMI 37) and also prediabetes A1c 5.8% predating since Mar 2012  And with last A1c 5.7% in Apr 2014. Patient denies reactive hypoglycemic symptoms, visual blurring, diabetic polys, or paresthesias.    Patient recently presented with Sx's of excessive fatigue and hypersomnia and was treated with Sx's of Nuvigil and had a very good response, but her Ins Co wouldn't cover her Rx. Then she was treated with Ritalin with an improvement, but not as much as with Nuvigil.     Finally, patient has history of Vitamin D Deficiency of  24 in 2008 and with last vitamin D 77 in Apr 2014.  Medication Sig  . ALPRAZolam (XANAX) 1 MG tablet Take one tablet three times dailey as needed  . aspirin 81 MG tablet Take 81 mg by mouth daily.  . Biotin 1 MG CAPS Take 1 capsule by mouth daily.  . bumetanide (BUMEX) 2 MG tablet TAKE 1 TABLET EVERY DAY  - SUBSTITUTED FOR  BUMEX  . buPROPion (WELLBUTRIN XL) 150 MG 24 hr tablet Take 1 tablet (150 mg total) by mouth daily.  . Cholecalciferol (VITAMIN D3) 10000 UNITS capsule Take 10,000 Units by mouth daily.  [name] IBUPROFEN 200 MG CAPS Take 200 mg by mouth at bedtime. Take two pills at bedtime  . esomeprazole (NEXIUM) 40 MG capsule Take 40  mg by mouth 2 (two) times daily before a meal.  . levothyroxine (SYNTHROID, LEVOTHROID) 50 MCG tablet Take 1 tablet (50 mcg total) by mouth daily before breakfast.  . methylphenidate (RITALIN) 20 MG tablet Take 1/2 to 1 tablet 1 or 2 x daily for alertness  . potassium chloride SA (K-DUR,KLOR-CON) 20 MEQ tablet Take 20 mEq by mouth daily. 1/2 tablet daily  . pravastatin (PRAVACHOL) 40 MG tablet TAKE 1 TABLET AT BEDTIME FOR CHOLESTEROL  . primidone (MYSOLINE) 250 MG tablet TAKE 1 TABLET TWICE DAILY  . PROBIOTIC CAPS Take 2 capsules by mouth daily.   . vitamin B-12 (CYANOCOBALAMIN) 100 MCG tablet Take 50 mcg by mouth daily.   Allergies  Allergen Reactions  . Enalapril Cough  . Quinapril   . Trazodone And Nefazodone   . Ancef [Cefazolin]   . Ivp Dye [Iodinated Diagnostic Agents]   . Penicillins     Past Medical History  Diagnosis Date  . Tremor   . Cerebrovascular disease   . Depression with anxiety   . Diabetes   . Dyslipidemia   . Hypertension   . Obesity   . Hypothyroidism   . GERD (gastroesophageal reflux disease)   . Hyperlipidemia   . Pre-diabetes   . Anemia   . Vitamin D deficiency   . Obesity     Past Surgical History  Procedure Laterality Date  . Breast reduction surgery    .  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 Gallbladder surgery    . Appendectomy    . Abdominal hysterectomy    . Laparoscopic gastric banding    . Abdominal wall mesh  removal    . Abdominal wall mesh  removal      Family History  Problem Relation Age of Onset  . Heart disease Mother   . Heart disease Father   . Diabetes Maternal Aunt     History  Substance Use Topics  . Smoking status: Never Smoker   . Smokeless tobacco: Not on file  . Alcohol Use: Yes     Comment: rarely    ROS Constitutional: Denies fever, chills, weight loss/gain, headaches, insomnia, fatigue, night sweats, and change in appetite. Eyes: Denies redness, blurred vision, diplopia, discharge, itchy,  watery eyes.  ENT: Denies discharge, congestion, post nasal drip, epistaxis, sore throat, earache, hearing loss, dental pain, Tinnitus, Vertigo, Sinus pain, snoring.  Cardio: Denies chest pain, palpitations, irregular heartbeat, syncope, dyspnea, diaphoresis, orthopnea, PND, claudication, edema Respiratory: denies cough, dyspnea, DOE, pleurisy, hoarseness, laryngitis, wheezing.  Gastrointestinal: Denies dysphagia, heartburn, reflux, water brash, pain, cramps, nausea, vomiting, bloating, diarrhea, constipation, hematemesis, melena, hematochezia, jaundice, hemorrhoids Genitourinary: Denies dysuria, frequency, urgency, nocturia, hesitancy, discharge, hematuria, flank pain Breast:Breast lumps, nipple discharge, bleeding.  Musculoskeletal: Denies arthralgia, myalgia, stiffness, Jt. Swelling, pain, limp, and strain/sprain. Skin: Denies puritis, rash, hives, warts, acne, eczema, changing in skin lesion Neuro: No weakness, tremor, incoordination, spasms, paresthesia, pain Psychiatric: Denies confusion, memory loss, sensory loss Endocrine: Denies change in weight, skin, hair change, nocturia, and paresthesia, diabetic polys, visual blurring, hyper / hypo glycemic episodes.  Heme/Lymph: No excessive bleeding, bruising, enlarged lymph nodes.   Physical Exam    BP 126/68  Pulse 64  Temp  97.5 F   Resp 16  Ht 5\' 4"    Wt 206 lb 3.2 oz   BMI 35.38 kg/m2  General Appearance: Well nourished, in no apparent distress. Eyes: PERRLA, EOMs, conjunctiva no swelling or erythema, normal fundi and vessels. Sinuses: No frontal/maxillary tenderness ENT/Mouth: EACs patent / TMs  nl. Nares clear without erythema, swelling, mucoid exudates. Oral hygiene is good. No erythema, swelling, or exudate. Tongue normal, non-obstructing. Tonsils not swollen or erythematous. Hearing normal.  Neck: Supple, thyroid normal. No bruits, nodes or JVD. Respiratory: Respiratory effort normal.  BS equal and clear bilateral without  rales, rhonci, wheezing or stridor. Cardio: Heart sounds are normal with regular rate and rhythm and no murmurs, rubs or gallops. Peripheral pulses are normal and equal bilaterally without edema. No aortic or femoral bruits. Chest: symmetric with normal excursions and percussion. Breasts: Symmetric, without lumps, nipple discharge, retractions, or fibrocystic changes.  Abdomen: Flat, soft, with bowl sounds. Nontender, no guarding, rebound, hernias, masses, or organomegaly.  Lymphatics: Non tender without lymphadenopathy.  Genitourinary:  Musculoskeletal: Full ROM all peripheral extremities, joint stability, 5/5 strength, and normal gait. Skin: Warm and dry without rashes, lesions, cyanosis, clubbing or  ecchymosis.  Neuro: Cranial nerves intact, reflexes equal bilaterally. Normal muscle tone, no cerebellar symptoms. Sensation intact.  Pysch: Awake and oriented X 3, normal affect, Insight and Judgment appropriate.   Assessment and Plan  1. Annual Screening Examination 2. Hypertension  3. Hyperlipidemia 4. Pre Diabetes 5. Vitamin D Deficiency 6. Morbid Obesity 7. Hypersomnolence - will try Adderall 30 mg - 1/2 to 1 tab qd.   Continue prudent diet as discussed, weight control, BP monitoring, regular exercise, and medications. Discussed med's effects and SE's. Screening labs  and tests as requested with regular follow-up as recommended.

## 2013-06-17 LAB — BASIC METABOLIC PANEL WITH GFR
BUN: 11 mg/dL (ref 6–23)
CHLORIDE: 102 meq/L (ref 96–112)
CO2: 24 meq/L (ref 19–32)
Calcium: 9.5 mg/dL (ref 8.4–10.5)
Creat: 1.01 mg/dL (ref 0.50–1.10)
GFR, EST NON AFRICAN AMERICAN: 59 mL/min — AB
GFR, Est African American: 68 mL/min
Glucose, Bld: 105 mg/dL — ABNORMAL HIGH (ref 70–99)
POTASSIUM: 4.1 meq/L (ref 3.5–5.3)
SODIUM: 140 meq/L (ref 135–145)

## 2013-06-17 LAB — LIPID PANEL
CHOL/HDL RATIO: 2.4 ratio
Cholesterol: 192 mg/dL (ref 0–200)
HDL: 79 mg/dL (ref >39)
LDL CALC: 87 mg/dL (ref 0–99)
Triglycerides: 128 mg/dL (ref <150)
VLDL: 26 mg/dL (ref 0–40)

## 2013-06-17 LAB — URINALYSIS, MICROSCOPIC ONLY
Bacteria, UA: NONE SEEN
CRYSTALS: NONE SEEN
Casts: NONE SEEN
SQUAMOUS EPITHELIAL / LPF: NONE SEEN

## 2013-06-17 LAB — HEPATIC FUNCTION PANEL
ALK PHOS: 122 U/L — AB (ref 39–117)
ALT: 17 U/L (ref 0–35)
AST: 21 U/L (ref 0–37)
Albumin: 4.5 g/dL (ref 3.5–5.2)
BILIRUBIN DIRECT: 0.1 mg/dL (ref 0.0–0.3)
BILIRUBIN INDIRECT: 0.3 mg/dL (ref 0.2–1.2)
BILIRUBIN TOTAL: 0.4 mg/dL (ref 0.2–1.2)
Total Protein: 6.9 g/dL (ref 6.0–8.3)

## 2013-06-17 LAB — MAGNESIUM: Magnesium: 2 mg/dL (ref 1.5–2.5)

## 2013-06-17 LAB — INSULIN, FASTING: Insulin fasting, serum: 12 u[IU]/mL (ref 3–28)

## 2013-06-17 LAB — HEPATITIS B SURFACE ANTIBODY,QUALITATIVE: HEP B S AB: NEGATIVE

## 2013-06-17 LAB — RPR

## 2013-06-17 LAB — MICROALBUMIN / CREATININE URINE RATIO
Creatinine, Urine: 41.3 mg/dL
MICROALB/CREAT RATIO: 12.1 mg/g (ref 0.0–30.0)
Microalb, Ur: 0.5 mg/dL (ref 0.00–1.89)

## 2013-06-17 LAB — HEPATITIS B CORE ANTIBODY, TOTAL: Hep B Core Total Ab: NONREACTIVE

## 2013-06-17 LAB — HIV ANTIBODY (ROUTINE TESTING W REFLEX): HIV 1&2 Ab, 4th Generation: NONREACTIVE

## 2013-06-17 LAB — HEPATITIS A ANTIBODY, TOTAL: HEP A TOTAL AB: NONREACTIVE

## 2013-06-17 LAB — HEPATITIS C ANTIBODY: HCV AB: NEGATIVE

## 2013-06-17 LAB — TSH: TSH: 1.403 u[IU]/mL (ref 0.350–4.500)

## 2013-06-17 LAB — VITAMIN D 25 HYDROXY (VIT D DEFICIENCY, FRACTURES): Vit D, 25-Hydroxy: 80 ng/mL (ref 30–89)

## 2013-06-20 LAB — HEPATITIS B E ANTIBODY: Hepatitis Be Antibody: NONREACTIVE

## 2013-06-21 ENCOUNTER — Ambulatory Visit
Admission: RE | Admit: 2013-06-21 | Discharge: 2013-06-21 | Disposition: A | Discharge status: Home / Self Care | Payer: Medicare HMO | Source: Ambulatory Visit

## 2013-06-21 DIAGNOSIS — Z1231 Encounter for screening mammogram for malignant neoplasm of breast: Secondary | ICD-10-CM

## 2013-06-21 LAB — PRIMIDONE AND METABOLITE LEVEL
Phenobarbital: 17.3 mg/L (ref 15.0–40.0)
Primidone, Serum: 12.3 mg/L — ABNORMAL HIGH (ref 5.0–12.0)

## 2013-07-06 ENCOUNTER — Encounter: Payer: Self-pay | Admitting: Internal Medicine

## 2013-07-27 ENCOUNTER — Telehealth: Payer: Self-pay | Admitting: Internal Medicine

## 2013-07-27 ENCOUNTER — Encounter: Payer: Self-pay | Admitting: Internal Medicine

## 2013-07-27 ENCOUNTER — Ambulatory Visit (INDEPENDENT_AMBULATORY_CARE_PROVIDER_SITE_OTHER): Payer: Commercial Managed Care - HMO | Admitting: Internal Medicine

## 2013-07-27 VITALS — BP 116/82 | HR 60 | Temp 97.7°F | Resp 16 | Ht 63.5 in | Wt 206.0 lb

## 2013-07-27 DIAGNOSIS — G4710 Hypersomnia, unspecified: Secondary | ICD-10-CM

## 2013-07-27 DIAGNOSIS — F341 Dysthymic disorder: Secondary | ICD-10-CM

## 2013-07-27 DIAGNOSIS — F909 Attention-deficit hyperactivity disorder, unspecified type: Secondary | ICD-10-CM

## 2013-07-27 MED ORDER — AMPHETAMINE-DEXTROAMPHETAMINE 30 MG PO TABS: ORAL_TABLET | ORAL | Status: DC

## 2013-07-27 NOTE — Patient Instructions (Signed)
Hypersomnia Hypersomnia usually brings recurrent episodes of excessive daytime sleepiness or prolonged nighttime sleep. It is different than feeling tired due to lack of or interrupted sleep at night. People with hypersomnia are compelled to nap repeatedly during the day. This is often at inappropriate times such as:  At work.  During a meal.  In conversation. These daytime naps usually provide no relief. This disorder typically affects adolescents and young adults. CAUSES  This condition may be caused by:  Another sleep disorder (such as narcolepsy or sleep apnea).  Dysfunction of the autonomic nervous system.  Drug or alcohol abuse.  A physical problem, such as:  A tumor.  Head trauma. This is damage caused by an accident.  Injury to the central nervous system.  Certain medications, or medicine withdrawal.  Medical conditions may contribute to the disorder, including:  Multiple sclerosis.  Depression.  Encephalitis.  Epilepsy.  Obesity.  Some people appear to have a genetic predisposition to this disorder. In others, there is no known cause. SYMPTOMS   Patients often have difficulty waking from a long sleep. They may feel dazed or confused.  Other symptoms may include:  Anxiety.  Increased irritation (inflammation).  Decreased energy.  Restlessness.  Slow thinking.  Slow speech.  Loss of appetite.  Hallucinations.  Memory difficulty.  Tremors, Tics.  Some patients lose the ability to function in family, social, occupational, or other settings. TREATMENT  Treatment is symptomatic in nature. Stimulants and other drugs may be used to treat this disorder. Changes in behavior may help. For example, avoid night work and social activities that delay bed time. Changes in diet may offer some relief. Patients should avoid alcohol and caffeine. PROGNOSIS  The likely outcome (prognosis) for persons with hypersomnia depends on the cause of the disorder.  The disorder itself is not life threatening. But it can have serious consequences. For example, automobile accidents can be caused by falling asleep while driving. The attacks usually continue indefinitely. Document Released: 02/14/2002 Document Revised: 05/19/2011 Document Reviewed: 01/19/2008 [facility] Patient Information 2014 [place], [place].   Depression, Adult Depression refers to feeling sad, low, down in the dumps, blue, gloomy, or empty. In general, there are two kinds of depression: 1. Depression that we all experience from time to time because of upsetting life experiences, including the loss of a job or the ending of a relationship (normal sadness or normal grief). This kind of depression is considered normal, is short lived, and resolves within a few days to 2 weeks. (Depression experienced after the loss of a loved one is called bereavement. Bereavement often lasts longer than 2 weeks but normally gets better with time.) 2. Clinical depression, which lasts longer than normal sadness or normal grief or interferes with your ability to function at home, at work, and in school. It also interferes with your personal relationships. It affects almost every aspect of your life. Clinical depression is an illness. Symptoms of depression also can be caused by conditions other than normal sadness and grief or clinical depression. Examples of these conditions are listed as follows:  Physical illness Some physical illnesses, including underactive thyroid gland (hypothyroidism), severe anemia, specific types of cancer, diabetes, uncontrolled seizures, heart and lung problems, strokes, and chronic pain are commonly associated with symptoms of depression.  Side effects of some prescription medicine In some people, certain types of prescription medicine can cause symptoms of depression.  Substance abuse Abuse of alcohol and illicit drugs can cause symptoms of depression. SYMPTOMS Symptoms of normal  sadness and normal grief include the following:  Feeling sad or crying for short periods of time.  Not caring about anything (apathy).  Difficulty sleeping or sleeping too much.  No longer able to enjoy the things you used to enjoy.  Desire to be by oneself all the time (social isolation).  Lack of energy or motivation.  Difficulty concentrating or remembering.  Change in appetite or weight.  Restlessness or agitation. Symptoms of clinical depression include the same symptoms of normal sadness or normal grief and also the following symptoms:  Feeling sad or crying all the time.  Feelings of guilt or worthlessness.  Feelings of hopelessness or helplessness.  Thoughts of suicide or the desire to harm yourself (suicidal ideation).  Loss of touch with reality (psychotic symptoms). Seeing or hearing things that are not real (hallucinations) or having false beliefs about your life or the people around you (delusions and paranoia). DIAGNOSIS  The diagnosis of clinical depression usually is based on the severity and duration of the symptoms. Your caregiver also will ask you questions about your medical history and substance use to find out if physical illness, use of prescription medicine, or substance abuse is causing your depression. Your caregiver also may order blood tests. TREATMENT  Typically, normal sadness and normal grief do not require treatment. However, sometimes antidepressant medicine is prescribed for bereavement to ease the depressive symptoms until they resolve. The treatment for clinical depression depends on the severity of your symptoms but typically includes antidepressant medicine, counseling with a mental health professional, or a combination of both. Your caregiver will help to determine what treatment is best for you. Depression caused by physical illness usually goes away with appropriate medical treatment of the illness. If prescription medicine is causing  depression, talk with your caregiver about stopping the medicine, decreasing the dose, or substituting another medicine. Depression caused by abuse of alcohol or illicit drugs abuse goes away with abstinence from these substances. Some adults need professional help in order to stop drinking or using drugs. SEEK IMMEDIATE CARE IF:  You have thoughts about hurting yourself or others.  You lose touch with reality (have psychotic symptoms).  You are taking medicine for depression and have a serious side effect. FOR MORE INFORMATION National Alliance on Mental Illness: www.nami.[employer] of [employer]: https://carter.com/ Document Released: 02/22/2000 Document Revised: 08/26/2011 Document Reviewed: 05/26/2011 [facility] Patient Information 2014 [place]. Attention Deficit Hyperactivity Disorder Attention deficit hyperactivity disorder (ADHD) is a problem with behavior issues based on the way the brain functions (neurobehavioral disorder). It is a common reason for behavior and academic problems in school. SYMPTOMS  There are 3 types of ADHD. The 3 types and some of the symptoms include:  Inattentive  Gets bored or distracted easily.  Loses or forgets things. Forgets to hand in homework.  Has trouble organizing or completing tasks.  Difficulty staying on task.  An inability to organize daily tasks and school work.  Leaving projects, chores, or homework unfinished.  Trouble paying attention or responding to details. Careless mistakes.  Difficulty following directions. Often seems like is not listening.  Dislikes activities that require sustained attention (like chores or homework).  Hyperactive-impulsive  Feels like it is impossible to sit still or stay in a seat. Fidgeting with hands and feet.  Trouble waiting turn.  Talking too much or out of turn. Interruptive.  Speaks or acts impulsively.  Aggressive, disruptive behavior.  Constantly busy or on the  go, noisy.  Often leaves seat  when they are expected to remain seated.  Often runs or climbs where it is not appropriate, or feels very restless.  Combined  Has symptoms of both of the above. Often children with ADHD feel discouraged about themselves and with school. They often perform well below their abilities in school. As children get older, the excess motor activities can calm down, but the problems with paying attention and staying organized persist. Most children do not outgrow ADHD but with good treatment can learn to cope with the symptoms. DIAGNOSIS  When ADHD is suspected, the diagnosis should be made by professionals trained in ADHD. This professional will collect information about the individual suspected of having ADHD. Information must be collected from various settings where the person lives, works, or attends school.  Diagnosis will include:  Confirming symptoms began in childhood.  Ruling out other reasons for the child's behavior.  The health care providers will check with the child's school and check their medical records.  They will talk to teachers and parents.  Behavior rating scales for the child will be filled out by those dealing with the child on a daily basis. A diagnosis is made only after all information has been considered. TREATMENT  Treatment usually includes behavioral treatment, tutoring or extra support in school, and stimulant medicines. Because of the way a person's brain works with ADHD, these medicines decrease impulsivity and hyperactivity and increase attention. This is different than how they would work in a person who does not have ADHD. Other medicines used include antidepressants and certain blood pressure medicines. Most experts agree that treatment for ADHD should address all aspects of the person's functioning. Along with medicines, treatment should include structured classroom management at school. Parents should reward good behavior,  provide constant discipline, and limit-setting. Tutoring should be available for the child as needed. ADHD is a life-long condition. If untreated, the disorder can have long-term serious effects into adolescence and adulthood. HOME CARE INSTRUCTIONS   Often with ADHD there is a lot of frustration among family members dealing with the condition. Blame and anger are also feelings that are common. In many cases, because the problem affects the family as a whole, the entire family may need help. A therapist can help the family find better ways to handle the disruptive behaviors of the person with ADHD and promote change. If the person with ADHD is young, most of the therapist's work is with the parents. Parents will learn techniques for coping with and improving their child's behavior. Sometimes only the child with the ADHD needs counseling. Your health care providers can help you make these decisions.  Children with ADHD may need help learning how to organize. Some helpful tips include:  Keep routines the same every day from wake-up time to bedtime. Schedule all activities, including homework and playtime. Keep the schedule in a place where the person with ADHD will often see it. Mark schedule changes as far in advance as possible.  Schedule outdoor and indoor recreation.  Have a place for everything and keep everything in its place. This includes clothing, backpacks, and school supplies.  Encourage writing down assignments and bringing home needed books. Work with your child's teachers for assistance in organizing school work.  Offer your child a well-balanced diet. Breakfast that includes a balance of whole grains, protein and, fruits or vegetables is especially important for school performance. Children should avoid drinks with caffeine including:  Soft drinks.  Coffee.  Tea.  However, some older children (adolescents) may  find these drinks helpful in improving their attention. Because it can  also be common for adolescents with ADHD to become addicted to caffeine, talk with your health care provider about what is a safe amount of caffeine intake for your child.  Children with ADHD need consistent rules that they can understand and follow. If rules are followed, give small rewards. Children with ADHD often receive, and expect, criticism. Look for good behavior and praise it. Set realistic goals. Give clear instructions. Look for activities that can foster success and self-esteem. Make time for pleasant activities with your child. Give lots of affection.  Parents are their children's greatest advocates. Learn as much as possible about ADHD. This helps you become a stronger and better advocate for your child. It also helps you educate your child's teachers and instructors if they feel inadequate in these areas. Parent support groups are often helpful. A national group with local chapters is called Children and Adults with Attention Deficit Hyperactivity Disorder (CHADD). SEEK MEDICAL CARE IF:  Your child has repeated muscle twitches, cough or speech outbursts.  Your child has sleep problems.  Your child has a marked loss of appetite.  Your child develops depression.  Your child has new or worsening behavioral problems.  Your child develops dizziness.  Your child has a racing heart.  Your child has stomach pains.  Your child develops headaches. SEEK IMMEDIATE MEDICAL CARE IF:  Your child has been diagnosed with depression or anxiety and the symptoms seem to be getting worse.  Your child has been depressed and suddenly appears to have increased energy or motivation.  You are worried that your child is having a bad reaction to a medication he or she is taking for ADHD. Document Released: 02/14/2002 Document Revised: 12/15/2012 Document Reviewed: 11/01/2012 [facility] Patient Information 2014 [place].

## 2013-07-27 NOTE — Progress Notes (Signed)
Subjective:    Patient ID: [name], female    DOB: 01/20/1951, 63 y.o.   MRN: [id number]  HPI [name] presented today as a walk-in with c/o depression. Apparently she had self D/C'd her Prozac sometime in the past and has not been back on it for 2 weeks, but she relates she still feels apathetic, unmotivated and stay in her PJ's all day. She denies any suicidal ideations. She also relate since her insurance provider would not approve her Nuvigil that she has been taking a friend's 250 mg at 1/2 tab or 25 mg daily. She also relates that she is in the donut hole and will not be able to afford her Wellbutrin as well as several other medications.   Medication List       This list is accurate as of: 07/27/13  6:34 PM.  Always use your most recent med list.               ALPRAZolam 1 MG tablet  Commonly known as:  XANAX  Take one tablet three times dailey as needed     amphetamine-dextroamphetamine 30 MG tablet  Commonly known as:  ADDERALL  Take 1/2 to 1 tablet every morning for alertness     Armodafinil 150 MG tablet  Commonly known as:  NUVIGIL  Take 1 tablet (150 mg total) by mouth daily.     aspirin 81 MG tablet  Take 81 mg by mouth daily.     Biotin 1 MG Caps  Take 1 capsule by mouth daily.     bumetanide 2 MG tablet  Commonly known as:  BUMEX  TAKE 1 TABLET EVERY DAY  - SUBSTITUTED FOR  BUMEX     buPROPion 150 MG 24 hr tablet  Commonly known as:  WELLBUTRIN XL  Take 1 tablet (150 mg total) by mouth daily.     EQ IBUPROFEN 200 MG Caps  Generic drug:  Ibuprofen  Take 200 mg by mouth at bedtime. Take two pills at bedtime     esomeprazole 40 MG capsule  Commonly known as:  NEXIUM  Take 40 mg by mouth 2 (two) times daily before a meal.     levothyroxine 50 MCG tablet  Commonly known as:  SYNTHROID, LEVOTHROID  Take 1 tablet (50 mcg total) by mouth daily before breakfast.     methylphenidate 20 MG tablet  Commonly known as:  RITALIN  Take 1/2 to 1 tablet 1 or  2 x daily for alertness     potassium chloride SA 20 MEQ tablet  Commonly known as:  K-DUR,KLOR-CON  Take 20 mEq by mouth daily. 1/2 tablet daily     pravastatin 40 MG tablet  Commonly known as:  PRAVACHOL  TAKE 1 TABLET AT BEDTIME FOR CHOLESTEROL     primidone 250 MG tablet  Commonly known as:  MYSOLINE  TAKE 1 TABLET TWICE DAILY     Probiotic Caps  Take 2 capsules by mouth daily.     vitamin B-12 100 MCG tablet  Commonly known as:  CYANOCOBALAMIN  Take 50 mcg by mouth daily.     Vitamin D3 10000 UNITS capsule  Take 10,000 Units by mouth daily.       Allergies  Allergen Reactions  . Enalapril Cough  . Quinapril   . Trazodone And Nefazodone   . Ancef [Cefazolin]   . Ivp Dye [Iodinated Diagnostic Agents]   . Penicillins    Past Medical History  Diagnosis Date  . Tremor   .  Cerebrovascular disease   . Depression with anxiety   . Diabetes   . Dyslipidemia   . Hypertension   . Obesity   . Hypothyroidism   . GERD (gastroesophageal reflux disease)   . Hyperlipidemia   . Pre-diabetes   . Anemia   . Vitamin D deficiency   . Obesity     Review of Systems Neg except intermittent heartburn and reflux.    Objective:   Physical Exam No Formal Exam with 20 minutes spent face-to-face in counseling   Assessment & Plan:   1. Hypersomnolence disorder  2. Attention deficit disorder with hyperactivity  3. Dysthymic disorder  - amphetamine-dextroamphetamine (ADDERALL) 30 MG tablet; Take 1/2 to 1 tablet every morning for alertness  Dispense: 30 tablet; Refill: 0   ROV - 1 mo

## 2013-07-27 NOTE — Telephone Encounter (Signed)
PATIENT REQUESTING THE STATUS ON THE [employer] PAPER WORK.  WAS IT FAXED TO COMPANY? PLEASE ADVISE PATIENT

## 2013-08-09 ENCOUNTER — Other Ambulatory Visit: Payer: Self-pay | Admitting: *Deleted

## 2013-08-09 MED ORDER — ESOMEPRAZOLE MAGNESIUM 40 MG PO CPDR: 40.0000 mg | DELAYED_RELEASE_CAPSULE | Freq: Two times a day (BID) | ORAL | Status: DC

## 2013-08-29 ENCOUNTER — Ambulatory Visit (INDEPENDENT_AMBULATORY_CARE_PROVIDER_SITE_OTHER): Payer: Commercial Managed Care - HMO | Admitting: Internal Medicine

## 2013-08-29 ENCOUNTER — Encounter: Payer: Self-pay | Admitting: Internal Medicine

## 2013-08-29 VITALS — BP 106/62 | HR 88 | Temp 99.0°F | Resp 16 | Ht 63.5 in | Wt 206.2 lb

## 2013-08-29 DIAGNOSIS — F3289 Other specified depressive episodes: Secondary | ICD-10-CM

## 2013-08-29 DIAGNOSIS — E559 Vitamin D deficiency, unspecified: Secondary | ICD-10-CM

## 2013-08-29 DIAGNOSIS — F32A Depression, unspecified: Secondary | ICD-10-CM | POA: Insufficient documentation

## 2013-08-29 DIAGNOSIS — E538 Deficiency of other specified B group vitamins: Secondary | ICD-10-CM

## 2013-08-29 DIAGNOSIS — I10 Essential (primary) hypertension: Secondary | ICD-10-CM

## 2013-08-29 DIAGNOSIS — F329 Major depressive disorder, single episode, unspecified: Secondary | ICD-10-CM

## 2013-08-29 DIAGNOSIS — Z79899 Other long term (current) drug therapy: Secondary | ICD-10-CM

## 2013-08-29 DIAGNOSIS — R7309 Other abnormal glucose: Secondary | ICD-10-CM

## 2013-08-29 DIAGNOSIS — D649 Anemia, unspecified: Secondary | ICD-10-CM

## 2013-08-29 LAB — CBC WITH DIFFERENTIAL/PLATELET
BASOS PCT: 0 % (ref 0–1)
Basophils Absolute: 0 10*3/uL (ref 0.0–0.1)
EOS ABS: 0.2 10*3/uL (ref 0.0–0.7)
EOS PCT: 2 % (ref 0–5)
HCT: 42.4 % (ref 36.0–46.0)
Hemoglobin: 14.6 g/dL (ref 12.0–15.0)
LYMPHS ABS: 1.8 10*3/uL (ref 0.7–4.0)
Lymphocytes Relative: 24 % (ref 12–46)
MCH: 29.7 pg (ref 26.0–34.0)
MCHC: 34.4 g/dL (ref 30.0–36.0)
MCV: 86.2 fL (ref 78.0–100.0)
Monocytes Absolute: 0.5 10*3/uL (ref 0.1–1.0)
Monocytes Relative: 6 % (ref 3–12)
NEUTROS PCT: 68 % (ref 43–77)
Neutro Abs: 5.2 10*3/uL (ref 1.7–7.7)
PLATELETS: 189 10*3/uL (ref 150–400)
RBC: 4.92 MIL/uL (ref 3.87–5.11)
RDW: 13.8 % (ref 11.5–15.5)
WBC: 7.7 10*3/uL (ref 4.0–10.5)

## 2013-08-29 NOTE — Progress Notes (Signed)
Subjective:    Patient ID: [name], female    DOB: 03-20-1950, 63 y.o.   MRN: [id number]  HPI 63 yo MWF with Hx/o HTN, HLD, Essential Tremor, GERD, Hypothyroidism, PreDiabetes and Vit D Deficiency returns for f/u on Depression after restarting her Prozac and also giving her a trial with Adderall for her hypersomnolence. Patient feels some improved , but resentful that her insurance carrier won't cover her meds and that she will be "in the doughnut hole" soon.    Medication List   ALPRAZolam 1 MG tablet  Commonly known as:  XANAX  Take one tablet three times dailey as needed     amphetamine-dextroamphetamine 30 MG tablet  Commonly known as:  ADDERALL  Take 1/2 to 1 tablet every morning for alertness     aspirin 81 MG tablet  Take 81 mg by mouth daily.     Biotin 1 MG Caps  Take 1 capsule by mouth daily.     bumetanide 2 MG tablet  Commonly known as:  BUMEX  TAKE 1 TABLET EVERY DAY  - SUBSTITUTED FOR  BUMEX     buPROPion 150 MG 24 hr tablet  Commonly known as:  WELLBUTRIN XL  Take 1 tablet (150 mg total) by mouth daily.     EQ IBUPROFEN 200 MG Caps  Generic drug:  Ibuprofen  Take 200 mg by mouth at bedtime. Take two pills at bedtime     esomeprazole 40 MG capsule  Commonly known as:  NEXIUM  Take 1 capsule (40 mg total) by mouth 2 (two) times daily before a meal.     levothyroxine 50 MCG tablet  Commonly known as:  SYNTHROID, LEVOTHROID  Take 1 tablet (50 mcg total) by mouth daily before breakfast.     methylphenidate 20 MG tablet  Commonly known as:  RITALIN  Take 1/2 to 1 tablet 1 or 2 x daily for alertness     potassium chloride SA 20 MEQ tablet  Commonly known as:  K-DUR,KLOR-CON  Take 20 mEq by mouth daily. 1/2 tablet daily     pravastatin 40 MG tablet  Commonly known as:  PRAVACHOL  TAKE 1 TABLET AT BEDTIME FOR CHOLESTEROL     primidone 250 MG tablet  Commonly known as:  MYSOLINE  TAKE 1 TABLET TWICE DAILY     Probiotic Caps  Take 2 capsules by  mouth daily.     vitamin B-12 100 MCG tablet  Commonly known as:  CYANOCOBALAMIN  Take 50 mcg by mouth daily.     Vitamin D3 10000 UNITS capsule  Take 10,000 Units by mouth daily.       Allergies  Allergen Reactions  . Enalapril Cough  . Quinapril   . Trazodone And Nefazodone   . Ancef [Cefazolin]   . Ivp Dye [Iodinated Diagnostic Agents]   . Penicillins    Past Medical History  Diagnosis Date  . Tremor   . Cerebrovascular disease   . Depression with anxiety   . Diabetes   . Dyslipidemia   . Hypertension   . Obesity   . Hypothyroidism   . GERD (gastroesophageal reflux disease)   . Hyperlipidemia   . Pre-diabetes   . Anemia   . Vitamin D deficiency   . Obesity    Past Surgical History  Procedure Laterality Date  . Breast reduction surgery    . 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 Gallbladder surgery    . Appendectomy    .  Abdominal hysterectomy    . Laparoscopic gastric banding    . Abdominal wall mesh  removal    . Abdominal wall mesh  removal     Review of Systems In addition to the HPI above,  No Fever-chills,  No Headache, No changes with Vision or hearing,  No problems swallowing food or Liquids,  No Chest pain or productive Cough or Shortness of Breath,  No Abdominal pain, No Nausea or Vommitting, Bowel movements are regular,  No Blood in stool or Urine,  No dysuria,  No new skin rashes or bruises,  No new joints pains-aches,  No new weakness, tingling, numbness in any extremity,  No recent weight loss,  No polyuria, polydypsia or polyphagia,  No significant Mental Stressors.  A full 10 point Review of Systems was done, except as stated above, all other Review of Systems were negative  Objective:   Physical Exam  BP 106/62  Pulse 88  Temp 99 F   Resp 16  Ht 5' 3.5"   Wt 206 lb 3.2 oz   BMI 35.95 kg/m2  HEENT - Eac's patent. TM's Nl.EOM's full. PERRLA. NasoOroPharynx clear. Neck - supple. Nl Thyroid. No bruits nodes  JVD Chest - Clear equal BS Cor - Nl HS. RRR w/o sig MGR. PP 1(+) No edema. Abd - No palpable organomegaly, masses or tenderness. BS nl. MS- FROM. w/o deformities. Muscle power tone and bulk Nl. Gait Nl. Neuro - No obvious Cr N abnormalities. Sensory, motor and Cerebellar functions appear Nl w/o focal abnormalities. Still fine tremor of hands.  Assessment & Plan:   1. Hypertension - TSH  2. Depressive disorder  3. Anemia, unspecified anemia type - Iron and TIBC  4. PreDiabetes - Hemoglobin A1c - Insulin, fasting  5. Encounter for long-term (current) use of other medications - CBC with Differential - BASIC METABOLIC PANEL WITH GFR  6. Vitamin D deficiency  7. Other B-complex deficiencies (r/o on high dose Nexium bid) - Vitamin B12

## 2013-08-30 LAB — BASIC METABOLIC PANEL WITH GFR
BUN: 14 mg/dL (ref 6–23)
CALCIUM: 9.7 mg/dL (ref 8.4–10.5)
CO2: 31 mEq/L (ref 19–32)
CREATININE: 1.13 mg/dL — AB (ref 0.50–1.10)
Chloride: 101 mEq/L (ref 96–112)
GFR, EST AFRICAN AMERICAN: 60 mL/min
GFR, Est Non African American: 52 mL/min — ABNORMAL LOW
Glucose, Bld: 155 mg/dL — ABNORMAL HIGH (ref 70–99)
Potassium: 3.8 mEq/L (ref 3.5–5.3)
SODIUM: 143 meq/L (ref 135–145)

## 2013-08-30 LAB — HEMOGLOBIN A1C
Hgb A1c MFr Bld: 5.9 % — ABNORMAL HIGH (ref <5.7)
Mean Plasma Glucose: 123 mg/dL — ABNORMAL HIGH (ref <117)

## 2013-08-30 LAB — VITAMIN B12: Vitamin B-12: 574 pg/mL (ref 211–911)

## 2013-08-30 LAB — IRON AND TIBC
%SAT: 33 % (ref 20–55)
IRON: 100 ug/dL (ref 42–145)
TIBC: 306 ug/dL (ref 250–470)
UIBC: 206 ug/dL (ref 125–400)

## 2013-08-30 LAB — TSH: TSH: 0.75 u[IU]/mL (ref 0.350–4.500)

## 2013-08-30 LAB — INSULIN, FASTING: Insulin fasting, serum: 84 u[IU]/mL — ABNORMAL HIGH (ref 3–28)

## 2013-09-04 ENCOUNTER — Encounter: Payer: Self-pay | Admitting: Internal Medicine

## 2013-09-05 ENCOUNTER — Telehealth: Payer: Self-pay | Admitting: *Deleted

## 2013-09-05 ENCOUNTER — Encounter: Payer: Self-pay | Admitting: Internal Medicine

## 2013-09-05 NOTE — Telephone Encounter (Signed)
I called and spoke to patient regarding her request for a psychiatrist referral.  I Informed patient that she will have to get doctors who except her insurance and the patient has to make her own appointment.

## 2013-09-05 NOTE — Telephone Encounter (Signed)
Patient called to request medicine change.  Would like a phone call.

## 2013-09-06 ENCOUNTER — Telehealth: Payer: Self-pay | Admitting: Neurology

## 2013-09-06 NOTE — Telephone Encounter (Signed)
Patient calling to request an appointment for a psychiatric evaluation. Please return call to patient and advise.

## 2013-09-06 NOTE — Telephone Encounter (Signed)
Spoke with patient and she said that she wanted a sooner appt.with Dr Jannifer Franklin for psychiatric evaluation. She has had a lifelong battle with depression and Dr Melford Aase (PCP)has kept her leveled for over the years. Dr Melford Aase has  suggested now that she see a Psychiatry and that he will give a referral but she has to contact their office to schedule.she does not feel that she is able to call herself. She is not wanting to leave the house,staying locked in bedroom,not suicidal and does not wish to go to hospital. Patient is sobbing during telephone conversation.

## 2013-09-06 NOTE — Telephone Encounter (Signed)
I called patient. The patient had a lot of problems with anxiety and depression. She thought that we had a psychiatrist in our group, we do not. She will call her primary doctor to get a referral to psychiatry.

## 2013-09-13 ENCOUNTER — Ambulatory Visit: Payer: Self-pay | Admitting: Emergency Medicine

## 2013-09-20 ENCOUNTER — Ambulatory Visit: Payer: Self-pay | Admitting: Emergency Medicine

## 2013-09-29 ENCOUNTER — Encounter: Payer: Self-pay | Admitting: Physician Assistant

## 2013-09-29 ENCOUNTER — Ambulatory Visit (INDEPENDENT_AMBULATORY_CARE_PROVIDER_SITE_OTHER): Payer: Commercial Managed Care - HMO | Admitting: Physician Assistant

## 2013-09-29 ENCOUNTER — Ambulatory Visit: Payer: Self-pay | Admitting: Emergency Medicine

## 2013-09-29 VITALS — BP 120/72 | HR 68 | Temp 97.9°F | Resp 16 | Wt 208.0 lb

## 2013-09-29 DIAGNOSIS — F329 Major depressive disorder, single episode, unspecified: Secondary | ICD-10-CM

## 2013-09-29 DIAGNOSIS — F3289 Other specified depressive episodes: Secondary | ICD-10-CM

## 2013-09-29 DIAGNOSIS — N3000 Acute cystitis without hematuria: Secondary | ICD-10-CM

## 2013-09-29 DIAGNOSIS — Z79899 Other long term (current) drug therapy: Secondary | ICD-10-CM

## 2013-09-29 DIAGNOSIS — E039 Hypothyroidism, unspecified: Secondary | ICD-10-CM

## 2013-09-29 DIAGNOSIS — E559 Vitamin D deficiency, unspecified: Secondary | ICD-10-CM

## 2013-09-29 DIAGNOSIS — D649 Anemia, unspecified: Secondary | ICD-10-CM

## 2013-09-29 DIAGNOSIS — R7309 Other abnormal glucose: Secondary | ICD-10-CM

## 2013-09-29 DIAGNOSIS — Z789 Other specified health status: Secondary | ICD-10-CM

## 2013-09-29 DIAGNOSIS — Z0000 Encounter for general adult medical examination without abnormal findings: Secondary | ICD-10-CM

## 2013-09-29 DIAGNOSIS — I10 Essential (primary) hypertension: Secondary | ICD-10-CM

## 2013-09-29 DIAGNOSIS — E782 Mixed hyperlipidemia: Secondary | ICD-10-CM

## 2013-09-29 MED ORDER — PRIMIDONE 250 MG PO TABS: ORAL_TABLET | ORAL | Status: DC

## 2013-09-29 MED ORDER — AMPHETAMINE-DEXTROAMPHETAMINE 30 MG PO TABS: 30.0000 mg | ORAL_TABLET | Freq: Every day | ORAL | Status: DC

## 2013-09-29 MED ORDER — FLUOXETINE HCL 20 MG PO CAPS: 20.0000 mg | ORAL_CAPSULE | Freq: Every day | ORAL | Status: DC

## 2013-09-29 NOTE — Patient Instructions (Addendum)
Decrease thyroid med to 1/2 pill on Sunday only and 1 pill other days to see if helps decrease tremor.   Cologuard is an easy to use noninvasive colon cancer screening test based on the latest advances in stool DNA science.   Colon cancer is 3rd most diagnosed cancer and 2nd leading cause of death in both men and women 64 years of age and older despite being one of the most preventable and treatable cancers if found early. You have agreed to do a Cologuard screening and have declined a colonoscopy in spite of being explained the risks and benefits of the colonoscopy in detail, including cancer and death. Please understand that this is test not as sensitive or specific as a colonoscopy and you are still recommended to get a colonoscopy.   If you are NOT medicare please call your insurance company and given them this CPT code, [phone], in order to see how much your insurance company will cover.   You will receive a short call from [employer] support center at [employer], when you receive a call they will say they are from [place],  to confirm your mailing address and give you more information.  When they calll you, it will appear on the caller ID as "[employer]" or in some cases only this number will appear, [phone].   [employer] will ship your collection kit directly to you. You will collect a single stool sample in the privacy of your own home, no special preparation required. You will return the kit via [employer] pre-paid shipping or pick-up, in the same box it arrived in. Then I will contact you to discuss your results after I receive them from the laboratory.   If you have any questions or concerns, Cologuard Customer Support Specialist are available 24 hours a day, 7 days a week at [phone] or go to [email or web address].    Preventative Care for Adults - Female      MAINTAIN REGULAR HEALTH EXAMS:  A routine yearly physical is a good way  to check in with your primary care provider about your health and preventive screening. It is also an opportunity to share updates about your health and any concerns you have, and receive a thorough all-over exam.   Most health insurance companies pay for at least some preventative services.  Check with your health plan for specific coverages.  WHAT PREVENTATIVE SERVICES DO WOMEN NEED?  Adult women should have their weight and blood pressure checked regularly.   Women age 32 and older should have their cholesterol levels checked regularly.  Women should be screened for cervical cancer with a Pap smear and pelvic exam beginning at either age 33, or 3 years after they become sexually activity.    Breast cancer screening generally begins at age 99 with a mammogram and breast exam by your primary care provider.    Beginning at age 58 and continuing to age 59, women should be screened for colorectal cancer.  Certain people may need continued testing until age 62.  Updating vaccinations is part of preventative care.  Vaccinations help protect against diseases such as the flu.  Osteoporosis is a disease in which the bones lose minerals and strength as we age. Women ages 32 and over should discuss this with their caregivers, as should women after menopause who have other risk factors.  Lab tests are generally done as part of preventative care to screen for anemia and blood disorders, to screen for problems  with the kidneys and liver, to screen for bladder problems, to check blood sugar, and to check your cholesterol level.  Preventative services generally include counseling about diet, exercise, avoiding tobacco, drugs, excessive alcohol consumption, and sexually transmitted infections.    GENERAL RECOMMENDATIONS FOR GOOD HEALTH:  Healthy diet:  Eat a variety of foods, including fruit, vegetables, animal or vegetable protein, such as meat, fish, chicken, and eggs, or beans, lentils, tofu, and  grains, such as rice.  Drink plenty of water daily.  Decrease saturated fat in the diet, avoid lots of red meat, processed foods, sweets, fast foods, and fried foods.  Exercise:  Aerobic exercise helps maintain good heart health. At least 30-40 minutes of moderate-intensity exercise is recommended. For example, a brisk walk that increases your heart rate and breathing. This should be done on most days of the week.   Find a type of exercise or a variety of exercises that you enjoy so that it becomes a part of your daily life.  Examples are running, walking, swimming, water aerobics, and biking.  For motivation and support, explore group exercise such as aerobic class, spin class, Zumba, Yoga,or  martial arts, etc.    Set exercise goals for yourself, such as a certain weight goal, walk or run in a race such as a 5k walk/run.  Speak to your primary care provider about exercise goals.  Disease prevention:  If you smoke or chew tobacco, find out from your caregiver how to quit. It can literally save your life, no matter how long you have been a tobacco user. If you do not use tobacco, never begin.   Maintain a healthy diet and normal weight. Increased weight leads to problems with blood pressure and diabetes.   The Body Mass Index or BMI is a way of measuring how much of your body is fat. Having a BMI above 27 increases the risk of heart disease, diabetes, hypertension, stroke and other problems related to obesity. Your caregiver can help determine your BMI and based on it develop an exercise and dietary program to help you achieve or maintain this important measurement at a healthful level.  High blood pressure causes heart and blood vessel problems.  Persistent high blood pressure should be treated with medicine if weight loss and exercise do not work.   Fat and cholesterol leaves deposits in your arteries that can block them. This causes heart disease and vessel disease elsewhere in your body.   If your cholesterol is found to be high, or if you have heart disease or certain other medical conditions, then you may need to have your cholesterol monitored frequently and be treated with medication.   Ask if you should have a cardiac stress test if your history suggests this. A stress test is a test done on a treadmill that looks for heart disease. This test can find disease prior to there being a problem.  Menopause can be associated with physical symptoms and risks. Hormone replacement therapy is available to decrease these. You should talk to your caregiver about whether starting or continuing to take hormones is right for you.   Osteoporosis is a disease in which the bones lose minerals and strength as we age. This can result in serious bone fractures. Risk of osteoporosis can be identified using a bone density scan. Women ages 38 and over should discuss this with their caregivers, as should women after menopause who have other risk factors. Ask your caregiver whether you should be taking a  calcium supplement and Vitamin D, to reduce the rate of osteoporosis.   Avoid drinking alcohol in excess (more than two drinks per day).  Avoid use of street drugs. Do not share needles with anyone. Ask for professional help if you need assistance or instructions on stopping the use of alcohol, cigarettes, and/or drugs.  Brush your teeth twice a day with fluoride toothpaste, and floss once a day. Good oral hygiene prevents tooth decay and gum disease. The problems can be painful, unattractive, and can cause other health problems. Visit your dentist for a routine oral and dental check up and preventive care every 6-12 months.   Look at your skin regularly.  Use a mirror to look at your back. Notify your caregivers of changes in moles, especially if there are changes in shapes, colors, a size larger than a pencil eraser, an irregular border, or development of new moles.  Safety:  Use seatbelts 100% of the  time, whether driving or as a passenger.  Use safety devices such as hearing protection if you work in environments with loud noise or significant background noise.  Use safety glasses when doing any work that could send debris in to the eyes.  Use a helmet if you ride a bike or motorcycle.  Use appropriate safety gear for contact sports.  Talk to your caregiver about gun safety.  Use sunscreen with a SPF (or skin protection factor) of 15 or greater.  Lighter skinned people are at a greater risk of skin cancer. Don't forget to also wear sunglasses in order to protect your eyes from too much damaging sunlight. Damaging sunlight can accelerate cataract formation.   Practice safe sex. Use condoms. Condoms are used for birth control and to help reduce the spread of sexually transmitted infections (or STIs).  Some of the STIs are gonorrhea (the clap), chlamydia, syphilis, trichomonas, herpes, HPV (human papilloma virus) and HIV (human immunodeficiency virus) which causes AIDS. The herpes, HIV and HPV are viral illnesses that have no cure. These can result in disability, cancer and death.   Keep carbon monoxide and smoke detectors in your home functioning at all times. Change the batteries every 6 months or use a model that plugs into the wall.   Vaccinations:  Stay up to date with your tetanus shots and other required immunizations. You should have a booster for tetanus every 10 years. Be sure to get your flu shot every year, since 5%-20% of the [place] population comes down with the flu. The flu vaccine changes each year, so being vaccinated once is not enough. Get your shot in the fall, before the flu season peaks.   Other vaccines to consider:  Human Papilloma Virus or HPV causes cancer of the cervix, and other infections that can be transmitted from person to person. There is a vaccine for HPV, and females should get immunized between the ages of 28 and 18. It requires a series of 3 shots.   Pneumococcal  vaccine to protect against certain types of pneumonia.  This is normally recommended for adults age 12 or older.  However, adults younger than 64 years old with certain underlying conditions such as diabetes, heart or lung disease should also receive the vaccine.  Shingles vaccine to protect against Varicella Zoster if you are older than age 43, or younger than 64 years old with certain underlying illness.  Hepatitis A vaccine to protect against a form of infection of the liver by a virus acquired from food.  Hepatitis B  vaccine to protect against a form of infection of the liver by a virus acquired from blood or body fluids, particularly if you work in health care.  If you plan to travel internationally, check with your local health department for specific vaccination recommendations.  Cancer Screening:  Breast cancer screening is essential to preventive care for women. All women age 45 and older should perform a breast self-exam every month. At age 64 and older, women should have their caregiver complete a breast exam each year. Women at ages 77 and older should have a mammogram (x-ray film) of the breasts. Your caregiver can discuss how often you need mammograms.    Cervical cancer screening includes taking a Pap smear (sample of cells examined under a microscope) from the cervix (end of the uterus). It also includes testing for HPV (Human Papilloma Virus, which can cause cervical cancer). Screening and a pelvic exam should begin at age 62, or 3 years after a woman becomes sexually active. Screening should occur every year, with a Pap smear but no HPV testing, up to age 58. After age 58, you should have a Pap smear every 3 years with HPV testing, if no HPV was found previously.   Most routine colon cancer screening begins at the age of 15. On a yearly basis, doctors may provide special easy to use take-home tests to check for hidden blood in the stool. Sigmoidoscopy or colonoscopy can detect the  earliest forms of colon cancer and is life saving. These tests use a small camera at the end of a tube to directly examine the colon. Speak to your caregiver about this at age 53, when routine screening begins (and is repeated every 5 years unless early forms of pre-cancerous polyps or small growths are found).

## 2013-09-29 NOTE — Progress Notes (Signed)
MEDICARE ANNUAL WELLNESS VISIT AND FOLLOW UP  Assessment:   Colonoscopy- patient declines a colonoscopy even though the risks and benefits were discussed at length. Colon cancer is 3rd most diagnosed cancer and 2nd leading cause of death in both men and women 64 years of age and older. Patient understands the risk of cancer and death with declining the test however they are willing to do cologuard screening instead. They understand that this is not as sensitive or specific as a colonoscopy and they are still recommended to get a colonoscopy. The cologuard will be sent out to their house.  Incontinence- check urine Tremor- will follow up Dr. Jannifer Franklin and decrease thyroid pill Depression- check UTI, follow up with psychiatrist.    Plan:   During the course of the visit the patient was educated and counseled about appropriate screening and preventive services including:    Pneumococcal vaccine   Influenza vaccine  Td vaccine  Screening electrocardiogram  Screening mammography  Bone densitometry screening  Colorectal cancer screening  Diabetes screening  Glaucoma screening  Nutrition counseling   Advanced directives: given info/requested  Screening recommendations, referrals:  Vaccinations: Tdap vaccine not indicated Influenza vaccine not indicated Pneumococcal vaccine declined Shingles vaccine not indicated Hep B vaccine not indicated  Nutrition assessed and recommended  Colonoscopy declines but willing to do cologaurd Mammogram up to date Pap smear not indicated Pelvic exam not indicated Recommended yearly ophthalmology/optometry visit for glaucoma screening and checkup Recommended yearly dental visit for hygiene and checkup Advanced directives - requested  Conditions/risks identified: BMI: Discussed weight loss, diet, and increase physical activity.  Increase physical activity: AHA recommends 150 minutes of physical activity a week.  Medications reviewed DEXA-  requested Diabetes is at goal Urinary Incontinence is an issue: discussed non pharmacology and pharmacology options.  Fall risk: low- discussed PT, home fall assessment, medications.    Subjective:   [name] is a 63 y.o. female who presents for Medicare Annual Wellness Visit and 3 month follow up on hypertension, prediabetes, hyperlipidemia, vitamin D def.  Date of last medicare wellness visit is unknown.   Her blood pressure has been controlled at home, today their BP is BP: 120/72 mmHg She does workout. She denies chest pain, shortness of breath, dizziness.  She is on cholesterol medication and denies myalgias. Her cholesterol is at goal. The cholesterol last visit was:   Lab Results  Component Value Date   CHOL 192 06/16/2013   HDL 79 06/16/2013   LDLCALC 87 06/16/2013   TRIG 128 06/16/2013   CHOLHDL 2.4 06/16/2013   She has been working on diet and exercise for prediabetes, and denies paresthesia of the feet, polydipsia and polyuria. Last A1C in the office was:  Lab Results  Component Value Date   HGBA1C 5.9* 08/29/2013   Patient is on Vitamin D supplement. Lab Results  Component Value Date   VD25OH 79 06/16/2013     She is on thyroid medication. Her medication was not changed last visit. Patient denies palpitations and weight changes.  Lab Results  Component Value Date   TSH 0.750 08/29/2013  .   She has major depression D/O fo years and stopped the celexa and wellbutrin and switched to prozac 20mg  AM and 40mg  PM.  She states her tremors iare getting worse, she is going to see Dr. Jannifer Franklin on Aug 02. She has increased her primidone to three a day which has not helped and she is on xanax 3 times a day, 1 at night  and 1/2 during the day occ. Has been on epitol in the past which did not help. . She has been having worsening tremors, non intention tremor but also can be worse with intention.  She is no in the donut hole.  She has had decreased appetitie last few months, declines  night sweats, and weight is stable.    Names of Other Physician/Practitioners you currently use: 1. [facility]- here for primary care 2. Can't remember, eye doctor, last visit yearly, last week 3. Can't remember, dentist, last visit q 6 months Patient Care Team: Unk Pinto, MD as PCP - General (Internal Medicine) Dr. Jannifer Franklin  Medication Review Current Outpatient Prescriptions on File Prior to Visit  Medication Sig Dispense Refill  . ALPRAZolam (XANAX) 1 MG tablet Take one tablet three times dailey as needed  270 tablet  1  . amphetamine-dextroamphetamine (ADDERALL) 30 MG tablet Take 30 mg by mouth daily.      [name] Kitchen aspirin 81 MG tablet Take 81 mg by mouth daily.      . Biotin 1 MG CAPS Take 1 capsule by mouth daily.      . bumetanide (BUMEX) 2 MG tablet TAKE 1 TABLET EVERY DAY  - SUBSTITUTED FOR  BUMEX  90 tablet  1  . Cholecalciferol (VITAMIN D3) 10000 UNITS capsule Take 10,000 Units by mouth daily.      [name] IBUPROFEN 200 MG CAPS Take 200 mg by mouth at bedtime. Take two pills at bedtime      . esomeprazole (NEXIUM) 40 MG capsule Take 1 capsule (40 mg total) by mouth 2 (two) times daily before a meal.  180 capsule  4  . levothyroxine (SYNTHROID, LEVOTHROID) 50 MCG tablet Take 1 tablet (50 mcg total) by mouth daily before breakfast.  90 tablet  1  . potassium chloride SA (K-DUR,KLOR-CON) 20 MEQ tablet Take 20 mEq by mouth daily. 1/2 tablet daily      . pravastatin (PRAVACHOL) 40 MG tablet TAKE 1 TABLET AT BEDTIME FOR CHOLESTEROL  90 tablet  1  . PROBIOTIC CAPS Take 2 capsules by mouth daily.       . vitamin B-12 (CYANOCOBALAMIN) 100 MCG tablet Take 50 mcg by mouth daily.       No current facility-administered medications on file prior to visit.    Current Problems (verified) Patient Active Problem List   Diagnosis Date Noted  . Depressive disorder, not elsewhere classified 08/29/2013  . Anemia 08/29/2013  . Vitamin D Deficiency 06/16/2013  .  Encounter for long-term (current) use of other medications 06/16/2013  . Obesity, morbid 05/09/2013  . PreDiabetes 03/07/2013  . Hypothyroidism 03/06/2013  . Hyperlipidemia 03/06/2013  . Essential Tremor 07/19/2012  . Hypertension 05/15/2008  . GERD 05/15/2008  . ABDOMINAL WALL HERNIA 05/15/2008  . TRACHEAL STENOSIS 05/15/2008  . INTESTINAL OBSTRUCTION, HX OF 05/15/2008    Screening Tests Health Maintenance  Topic Date Due  . Pap Smear  11/23/1967  . Tetanus/tdap  11/22/1968  . Colonoscopy  11/23/1999  . Influenza Vaccine  10/08/2013  . Mammogram  06/22/2015  . Zostavax  Completed     Immunization History  Administered Date(s) Administered  . DTaP 06/09/2011  . Influenza Whole 12/05/2010, 11/09/2011  . Zoster 06/09/2012    Preventative care: Last colonoscopy: declines Last mammogram: 06/2013 Last pap smear/pelvic exam: remote  DEXA: never  Prior vaccinations: TD or Tdap: 2013  Influenza: 2014 Pneumococcal: 2000 Shingles/Zostavax: 2014  History reviewed: allergies, current medications, past family history,  past medical history, past social history, past surgical history and problem list  Risk Factors: Osteoporosis: postmenopausal estrogen deficiency and dietary calcium and/or vitamin D deficiency History of fracture in the past year: no  Tobacco History  Substance Use Topics  . Smoking status: Never Smoker   . Smokeless tobacco: Not on file  . Alcohol Use: Yes     Comment: rarely   She does not smoke.  Patient is not a former smoker. Are there smokers in your home (other than you)?  No  Alcohol Current alcohol use: none  Caffeine Current caffeine use: coffee 1 /day  Exercise Current exercise: walking  Nutrition/Diet Current diet: in general, a "healthy" diet    Cardiac risk factors: hypertension, obesity (BMI >= 30 kg/m2) and sedentary lifestyle.  Depression Screen (Note: if answer to either of the following is "Yes", a more complete  depression screening is indicated)   Q1: Over the past two weeks, have you felt down, depressed or hopeless? Yes  Q2: Over the past two weeks, have you felt little interest or pleasure in doing things? Yes  Have you lost interest or pleasure in daily life? Yes  Do you often feel hopeless? Yes  Do you cry easily over simple problems? Yes  Activities of Daily Living In your present state of health, do you have any difficulty performing the following activities?:  Driving? No Managing money?  No Feeding yourself? No Getting from bed to chair? No Climbing a flight of stairs? No Preparing food and eating?: No Bathing or showering? No Getting dressed: No Getting to the toilet? No Using the toilet:No Moving around from place to place: No In the past year have you fallen or had a near fall?:No   Are you sexually active?  Yes  Do you have more than one partner?  No  Vision Difficulties: No  Hearing Difficulties: No Do you often ask people to speak up or repeat themselves? No Do you experience ringing or noises in your ears? No Do you have difficulty understanding soft or whispered voices? No  Cognition  Do you feel that you have a problem with memory?No  Do you often misplace items? No  Do you feel safe at home?  Yes  Advanced directives Does patient have a [place]? Yes Does patient have a Living Will? Yes   Objective:   Blood pressure 120/72, pulse 68, temperature 97.9 F (36.6 C), resp. rate 16, weight 208 lb (94.348 kg). Body mass index is 36.26 kg/(m^2).[name] Kitchen Wt Readings from Last 3 Encounters:  09/29/13 208 lb (94.348 kg)  08/29/13 206 lb 3.2 oz (93.532 kg)  07/27/13 206 lb (93.441 kg)    General appearance: alert, no distress, WD/WN,  female Cognitive Testing  Alert? Yes  Normal Appearance?Yes  Oriented to person? Yes  Place? Yes   Time? Yes  Recall of three objects?  Yes  Can perform simple calculations? Yes  Displays appropriate  judgment?Yes  Can read the correct time from a watch face?Yes  HEENT: normocephalic, sclerae anicteric, TMs pearly, nares patent, no discharge or erythema, pharynx normal Oral cavity: MMM, no lesions Neck: supple, no lymphadenopathy, no thyromegaly, no masses Heart: RRR, normal S1, S2, no murmurs Lungs: CTA bilaterally, no wheezes, rhonchi, or rales Abdomen: +bs, soft, non tender, non distended, no masses, no hepatomegaly, no splenomegaly Musculoskeletal: nontender, no swelling, no obvious deformity Extremities: no edema, no cyanosis, no clubbing Pulses: 2+ symmetric, upper and lower extremities, normal cap refill Neurological: alert, oriented  x 3, CN2-12 intact, strength normal upper extremities and lower extremities, sensation normal throughout, DTRs 2+ throughout, + fine resting tremor made worse with intention, gait normal Psychiatric: normal affect, behavior normal, pleasant  Breast: defer Gyn: defer Rectal: defer  Medicare Attestation I have personally reviewed: The patient's medical and social history Their use of alcohol, tobacco or illicit drugs Their current medications and supplements The patient's functional ability including ADLs,fall risks, home safety risks, cognitive, and hearing and visual impairment Diet and physical activities Evidence for depression or mood disorders  The patient's weight, height, BMI, and visual acuity have been recorded in the chart.  I have made referrals, counseling, and provided education to the patient based on review of the above and I have provided the patient with a written personalized care plan for preventive services.     Vicie Mutters, PA-C   09/29/2013

## 2013-09-30 LAB — URINALYSIS, ROUTINE W REFLEX MICROSCOPIC
Bilirubin Urine: NEGATIVE
Glucose, UA: NEGATIVE mg/dL
HGB URINE DIPSTICK: NEGATIVE
KETONES UR: NEGATIVE mg/dL
LEUKOCYTES UA: NEGATIVE
Nitrite: NEGATIVE
Protein, ur: NEGATIVE mg/dL
SPECIFIC GRAVITY, URINE: 1.017 (ref 1.005–1.030)
UROBILINOGEN UA: 0.2 mg/dL (ref 0.0–1.0)
pH: 7 (ref 5.0–8.0)

## 2013-09-30 LAB — URINE CULTURE
Colony Count: NO GROWTH
Organism ID, Bacteria: NO GROWTH

## 2013-10-10 ENCOUNTER — Encounter: Payer: Self-pay | Admitting: Neurology

## 2013-10-10 ENCOUNTER — Ambulatory Visit (INDEPENDENT_AMBULATORY_CARE_PROVIDER_SITE_OTHER): Payer: Medicare HMO | Admitting: Neurology

## 2013-10-10 VITALS — BP 131/81 | HR 74 | Ht 62.5 in | Wt 206.0 lb

## 2013-10-10 DIAGNOSIS — G252 Other specified forms of tremor: Principal | ICD-10-CM

## 2013-10-10 DIAGNOSIS — Z5181 Encounter for therapeutic drug level monitoring: Secondary | ICD-10-CM

## 2013-10-10 DIAGNOSIS — G250 Essential tremor: Secondary | ICD-10-CM

## 2013-10-10 MED ORDER — PRIMIDONE 250 MG PO TABS: ORAL_TABLET | ORAL | Status: DC

## 2013-10-10 NOTE — Patient Instructions (Signed)

## 2013-10-10 NOTE — Progress Notes (Signed)
Reason for visit: Tremor  [name] is an 63 y.o. female  History of present illness:  [name] is a 63 year old right-handed white female with a history of an essential tremor. The patient has gone up on Mysoline taking 250 mg 3 times daily. The patient has reported some problems with increased depression and daytime drowsiness with this, and she has gone on Adderall recently which may have worsened the tremor. The patient also takes Xanax up to 1 mg 3 times daily. The patient remains on Prozac for depression which seemed to worsen in June of 2015, but this has stabilized at this time. The patient has had to restrict some of her activities of daily living because of the tremor, she is no longer able to do a lot of cooking for safety reasons. The patient has difficulty with handwriting. She returns to this office for an evaluation. She has not had blood work done since the increase in the Mysoline. A recent CBC and liver profile was done.  Past Medical History  Diagnosis Date  . Tremor   . Cerebrovascular disease   . Depression with anxiety   . Diabetes   . Dyslipidemia   . Hypertension   . Obesity   . Hypothyroidism   . GERD (gastroesophageal reflux disease)   . Hyperlipidemia   . Pre-diabetes   . Anemia   . Vitamin D deficiency   . Obesity     Past Surgical History  Procedure Laterality Date  . Breast reduction surgery    . 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 Gallbladder surgery    . Appendectomy    . Abdominal hysterectomy    . Laparoscopic gastric banding    . Abdominal wall mesh  removal    . Abdominal wall mesh  removal      Family History  Problem Relation Age of Onset  . Heart disease Mother   . Heart disease Father   . Diabetes Maternal Aunt     Social history:  reports that she has never smoked. She does not have any smokeless tobacco history on file. She reports that she drinks alcohol. She reports that she does not use illicit  drugs.    Allergies  Allergen Reactions  . Enalapril Cough  . Quinapril   . Trazodone And Nefazodone   . Ancef [Cefazolin]   . Ivp Dye [Iodinated Diagnostic Agents]   . Penicillins     Medications:  Current Outpatient Prescriptions on File Prior to Visit  Medication Sig Dispense Refill  . ALPRAZolam (XANAX) 1 MG tablet Take one tablet three times dailey as needed  270 tablet  1  . amphetamine-dextroamphetamine (ADDERALL) 30 MG tablet Take 1 tablet (30 mg total) by mouth daily.  90 tablet  0  . aspirin 81 MG tablet Take 81 mg by mouth daily.      . Biotin 1 MG CAPS Take 1 capsule by mouth daily.      . bumetanide (BUMEX) 2 MG tablet TAKE 1 TABLET EVERY DAY  - SUBSTITUTED FOR  BUMEX  90 tablet  1  . Cholecalciferol (VITAMIN D3) 10000 UNITS capsule Take 10,000 Units by mouth daily.      [name] IBUPROFEN 200 MG CAPS Take 200 mg by mouth at bedtime. Take two pills at bedtime      . esomeprazole (NEXIUM) 40 MG capsule Take 1 capsule (40 mg total) by mouth 2 (two) times daily before a meal.  180 capsule  4  . FLUoxetine (PROZAC) 20 MG capsule Take 1 capsule (20 mg total) by mouth daily.  90 capsule  1  . FLUoxetine (PROZAC) 40 MG capsule Take 40 mg by mouth at bedtime.      [name] Kitchen levothyroxine (SYNTHROID, LEVOTHROID) 50 MCG tablet Take 1 tablet (50 mcg total) by mouth daily before breakfast.  90 tablet  1  . potassium chloride SA (K-DUR,KLOR-CON) 20 MEQ tablet Take 20 mEq by mouth daily. 1/2 tablet daily      . pravastatin (PRAVACHOL) 40 MG tablet TAKE 1 TABLET AT BEDTIME FOR CHOLESTEROL  90 tablet  1  . PROBIOTIC CAPS Take 2 capsules by mouth daily.       . vitamin B-12 (CYANOCOBALAMIN) 100 MCG tablet Take 50 mcg by mouth daily.       No current facility-administered medications on file prior to visit.    ROS:  Out of a complete 14 system review of symptoms, the patient complains only of the following symptoms, and all other reviewed systems are negative.  Decreased activity, appetite  change, fatigue Daytime sleepiness, snoring Tremors, facial drooping Anxiety, depression  Blood pressure 131/81, pulse 74, height 5' 2.5" (1.588 m), weight 206 lb (93.441 kg).  Physical Exam  General: The patient is alert and cooperative at the time of the examination. The patient is moderately to markedly obese.  Skin: No significant peripheral edema is noted.   Neurologic Exam  Mental status: The patient is oriented x 3.  Cranial nerves: Facial symmetry is present. Speech is normal, no aphasia or dysarthria is noted. Extraocular movements are full. Visual fields are full.  Motor: The patient has good strength in all 4 extremities.  Sensory examination: Soft touch sensation is symmetric on the face, arms, and legs.  Coordination: The patient has good finger-nose-finger and heel-to-shin bilaterally. The patient does have resting and intention type tremor is, possibly slightly worse on the right. The tremors are present with ambulation, but the patient has good arm swing bilaterally. No features of parkinsonism is seen..  Gait and station: The patient has a normal gait. Tandem gait is slightly unsteady. Romberg is negative. No drift is seen.  Reflexes: Deep tendon reflexes are symmetric.   Assessment/Plan:  1. Essential tremor  2. Anxiety and depression  The patient is on Mysoline, and she has gone up on the medication on her own taking 3 of the 250 mg tablets daily. The patient will have blood levels drawn today. She reports increased daytime drowsiness which was significantly improved on Nuvigil, but she could not afford the medication. She is on Adderall which may have increased the tremor. The patient was given a prescription for the Mysoline, she will followup in about 6 months. The Mysoline may be increasing depression.  Jill Alexanders MD 10/10/2013 8:31 AM  [facility] [address] [place], [place] [address]  Phone [phone] Fax  [phone]

## 2013-10-11 ENCOUNTER — Telehealth: Payer: Self-pay | Admitting: Neurology

## 2013-10-11 LAB — PRIMIDONE, SERUM
Phenobarbital Lvl: 25 ug/mL (ref 15–40)
Primidone Lvl: 22.9 ug/mL (ref 5.0–12.0)

## 2013-10-11 NOTE — Telephone Encounter (Signed)
I called the patient. The blood work reveals a toxic primidone level of almost 23 with the upper range of therapeutic being 12. The phenobarbital is in a good range. The patient will cut back to 2-1/2 Mysoline tablets daily, and if she does not have a dramatic worsening of the tremor, she may drop back to taking only 2 tablets daily.

## 2013-10-19 NOTE — Telephone Encounter (Signed)
Close encounter 

## 2013-10-24 ENCOUNTER — Other Ambulatory Visit: Payer: Self-pay | Admitting: Physician Assistant

## 2013-10-26 LAB — COLOGUARD

## 2013-11-16 ENCOUNTER — Encounter: Payer: Self-pay | Admitting: Internal Medicine

## 2013-12-15 ENCOUNTER — Other Ambulatory Visit: Payer: Self-pay | Admitting: Physician Assistant

## 2013-12-15 ENCOUNTER — Encounter: Payer: Self-pay | Admitting: Internal Medicine

## 2013-12-15 MED ORDER — METHYLPHENIDATE HCL 20 MG PO TABS: 20.0000 mg | ORAL_TABLET | Freq: Two times a day (BID) | ORAL | Status: DC

## 2013-12-23 ENCOUNTER — Ambulatory Visit: Payer: Self-pay | Admitting: Internal Medicine

## 2013-12-23 ENCOUNTER — Other Ambulatory Visit: Payer: Self-pay

## 2013-12-31 ENCOUNTER — Other Ambulatory Visit: Payer: Self-pay | Admitting: Physician Assistant

## 2014-01-02 ENCOUNTER — Encounter: Payer: Self-pay | Admitting: Internal Medicine

## 2014-01-02 ENCOUNTER — Telehealth: Payer: Self-pay

## 2014-01-02 ENCOUNTER — Ambulatory Visit (INDEPENDENT_AMBULATORY_CARE_PROVIDER_SITE_OTHER): Payer: Commercial Managed Care - HMO | Admitting: Internal Medicine

## 2014-01-02 VITALS — BP 118/70 | HR 68 | Temp 97.6°F | Resp 16 | Ht 63.5 in | Wt 198.0 lb

## 2014-01-02 DIAGNOSIS — F329 Major depressive disorder, single episode, unspecified: Secondary | ICD-10-CM

## 2014-01-02 DIAGNOSIS — Z23 Encounter for immunization: Secondary | ICD-10-CM

## 2014-01-02 DIAGNOSIS — F32A Depression, unspecified: Secondary | ICD-10-CM

## 2014-01-02 DIAGNOSIS — E559 Vitamin D deficiency, unspecified: Secondary | ICD-10-CM

## 2014-01-02 DIAGNOSIS — R7309 Other abnormal glucose: Secondary | ICD-10-CM

## 2014-01-02 DIAGNOSIS — I10 Essential (primary) hypertension: Secondary | ICD-10-CM

## 2014-01-02 DIAGNOSIS — R7303 Prediabetes: Secondary | ICD-10-CM

## 2014-01-02 DIAGNOSIS — E782 Mixed hyperlipidemia: Secondary | ICD-10-CM

## 2014-01-02 DIAGNOSIS — Z79899 Other long term (current) drug therapy: Secondary | ICD-10-CM

## 2014-01-02 LAB — CBC WITH DIFFERENTIAL/PLATELET
Basophils Absolute: 0 10*3/uL (ref 0.0–0.1)
Basophils Relative: 1 % (ref 0–1)
Eosinophils Absolute: 0.1 10*3/uL (ref 0.0–0.7)
Eosinophils Relative: 3 % (ref 0–5)
HEMATOCRIT: 44.4 % (ref 36.0–46.0)
Hemoglobin: 15 g/dL (ref 12.0–15.0)
LYMPHS PCT: 37 % (ref 12–46)
Lymphs Abs: 1.7 10*3/uL (ref 0.7–4.0)
MCH: 30.2 pg (ref 26.0–34.0)
MCHC: 33.8 g/dL (ref 30.0–36.0)
MCV: 89.3 fL (ref 78.0–100.0)
MONOS PCT: 7 % (ref 3–12)
Monocytes Absolute: 0.3 10*3/uL (ref 0.1–1.0)
Neutro Abs: 2.4 10*3/uL (ref 1.7–7.7)
Neutrophils Relative %: 52 % (ref 43–77)
Platelets: 187 10*3/uL (ref 150–400)
RBC: 4.97 MIL/uL (ref 3.87–5.11)
RDW: 13.9 % (ref 11.5–15.5)
WBC: 4.6 10*3/uL (ref 4.0–10.5)

## 2014-01-02 MED ORDER — METHYLPHENIDATE HCL 20 MG PO TABS: 20.0000 mg | ORAL_TABLET | Freq: Two times a day (BID) | ORAL | Status: DC

## 2014-01-02 NOTE — Progress Notes (Signed)
Patient ID: [name], female   DOB: [birth year], 63 y.o.   MRN: [id number]   This very nice 63 y.o.MWF presents for 3 month follow up with Hypertension, Hyperlipidemia, Pre-Diabetes and Vitamin D Deficiency.     Patient ids having issues with hypersomnolence and is usin Ritalin with some benefit, altho not perceived as much as when she took Adderall or Provigil which her insurance provider will not cover . She is on a large dose of fluoxetine  And is also on mysoline from Dr Jannifer Franklin for her tremor, either or both of which may be contributatory to her lethargy.    Patient is treated for HTN (1980) & BP has been controlled at home. Today's BP: 118/70 mmHg. Patient has had no complaints of any cardiac type chest pain, palpitations, dyspnea/orthopnea/PND, dizziness, claudication, or dependent edema.   Hyperlipidemia is controlled with diet & meds. Patient denies myalgias or other med SE's. Last Lipids were at goal - Total Chol 192; HDL 79; LDL 87; Triglycerides 128 on 06/16/2013.   Also, the patient has history of  Morbid Obesity with BMI 34.52 and consequent PreDiabetes with A1c 5.8% in 2012 and has had no symptoms of reactive hypoglycemia, diabetic polys, paresthesias or visual blurring.  Last A1c was 5.9% on 08/29/2013.    Further, the patient also has history of Vitamin D Deficiency (24 in 2008)  and supplements vitamin D without any suspected side-effects. Last vitamin D was 80 on 06/16/2013.   Medication List   ALPRAZolam 1 MG tablet  Commonly known as:  XANAX  Take one tablet three times daily as needed     aspirin 81 MG tablet  Take 81 mg by mouth daily.     Biotin 1 MG Caps  Take 1 capsule by mouth daily.     bumetanide 2 MG tablet  Commonly known as:  BUMEX  TAKE 1 TABLET EVERY DAY  - SUBSTITUTED FOR  BUMEX     EQ IBUPROFEN 200 MG Caps  Generic drug:  Ibuprofen  Take 200 mg by mouth at bedtime. PRN     esomeprazole 40 MG capsule  Commonly known as:  NEXIUM  Take 1 capsule (40 mg  total) by mouth 2 (two) times daily before a meal.     FLUoxetine 40 MG capsule  Commonly known as:  PROZAC  Take 40 mg by mouth at bedtime.     FLUoxetine 20 MG capsule  Commonly known as:  PROZAC  Take 1 capsule (20 mg total) by mouth daily.     levothyroxine 50 MCG tablet  Commonly known as:  SYNTHROID, LEVOTHROID  TAKE 1 TABLET DAILY BEFORE BREAKFAST.     methylphenidate 20 MG tablet  Commonly known as:  RITALIN  Take 1 tablet (20 mg total) by mouth 2 (two) times daily with breakfast and lunch.     potassium chloride SA 20 MEQ tablet  Commonly known as:  K-DUR,KLOR-CON  Take 20 mEq by mouth daily. 1/2 tablet daily     pravastatin 40 MG tablet  Commonly known as:  PRAVACHOL  TAKE 1 TABLET AT BEDTIME FOR CHOLESTEROL     primidone 250 MG tablet  Commonly known as:  MYSOLINE  TAKE ONE TABLET THREE TIMES DAILY     Probiotic Caps  Take 2 capsules by mouth daily.     vitamin B-12 100 MCG tablet  Commonly known as:  CYANOCOBALAMIN  Take 50 mcg by mouth daily.     Vitamin D3 10000 UNITS capsule  Take  10,000 Units by mouth daily.     Allergies  Allergen Reactions  . Enalapril Cough  . Quinapril   . Trazodone And Nefazodone   . Ancef [Cefazolin]   . Ivp Dye [Iodinated Diagnostic Agents]   . Penicillins    PMHx:   Past Medical History  Diagnosis Date  . Tremor   . Cerebrovascular disease   . Depression with anxiety   . Diabetes   . Dyslipidemia   . Hypertension   . Obesity   . Hypothyroidism   . GERD (gastroesophageal reflux disease)   . Hyperlipidemia   . Pre-diabetes   . Anemia   . Vitamin D deficiency   . Obesity    Immunization History  Administered Date(s) Administered  . DTaP 06/09/2011  . Influenza Whole 12/05/2010, 11/09/2011  . Influenza, High Dose Seasonal PF 01/02/2014  . Zoster 06/09/2012   Past Surgical History  Procedure Laterality Date  . Breast reduction surgery    . 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 Gallbladder  surgery    . Appendectomy    . Abdominal hysterectomy    . Laparoscopic gastric banding    . Abdominal wall mesh  removal    . Abdominal wall mesh  removal     FHx:    Reviewed / unchanged  SHx:    Reviewed / unchanged  Systems Review:  Constitutional: Denies fever, chills, wt changes, headaches, insomnia, fatigue, night sweats, change in appetite. Eyes: Denies redness, blurred vision, diplopia, discharge, itchy, watery eyes.  ENT: Denies discharge, congestion, post nasal drip, epistaxis, sore throat, earache, hearing loss, dental pain, tinnitus, vertigo, sinus pain, snoring.  CV: Denies chest pain, palpitations, irregular heartbeat, syncope, dyspnea, diaphoresis, orthopnea, PND, claudication or edema. Respiratory: denies cough, dyspnea, DOE, pleurisy, hoarseness, laryngitis, wheezing.  Gastrointestinal: Denies dysphagia, odynophagia, heartburn, reflux, water brash, abdominal pain or cramps, nausea, vomiting, bloating, diarrhea, constipation, hematemesis, melena, hematochezia  or hemorrhoids. Genitourinary: Denies dysuria, frequency, urgency, nocturia, hesitancy, discharge, hematuria or flank pain. Musculoskeletal: Denies arthralgias, myalgias, stiffness, jt. swelling, pain, limping or strain/sprain.  Skin: Denies pruritus, rash, hives, warts, acne, eczema or change in skin lesion(s). Neuro: No weakness, tremor, incoordination, spasms, paresthesia or pain. Psychiatric: Denies confusion, memory loss or sensory loss. Endo: Denies change in weight, skin or hair change.  Heme/Lymph: No excessive bleeding, bruising or enlarged lymph nodes.  Exam:  BP 118/70  Pulse 68  Temp 97.6 F   Resp 16  Ht 5' 3.5"   Wt 198 lb   BMI 34.52   Appears well nourished and in no distress. Eyes: PERRLA, EOMs, conjunctiva no swelling or erythema. Sinuses: No frontal/maxillary tenderness ENT/Mouth: EAC's clear, TM's nl w/o erythema, bulging. Nares clear w/o erythema, swelling, exudates. Oropharynx clear  without erythema or exudates. Oral hygiene is good. Tongue normal, non obstructing. Hearing intact.  Neck: Supple. Thyroid nl. Car 2+/2+ without bruits, nodes or JVD. Chest: Respirations nl with BS clear & equal w/o rales, rhonchi, wheezing or stridor.  Cor: Heart sounds normal w/ regular rate and rhythm without sig. murmurs, gallops, clicks, or rubs. Peripheral pulses normal and equal  without edema.  Abdomen: Soft & bowel sounds normal. Non-tender w/o guarding, rebound, hernias, masses, or organomegaly.  Lymphatics: Unremarkable.  Musculoskeletal: Full ROM all peripheral extremities, joint stability, 5/5 strength, and normal gait.  Skin: Warm, dry without exposed rashes, lesions or ecchymosis apparent.  Neuro: Cranial nerves intact, reflexes equal bilaterally. Sensory-motor testing grossly intact. Tendon reflexes grossly intact.  Pysch:  Alert & oriented x 3.  Insight and judgement nl & appropriate. No ideations.  Assessment and Plan:  1. Hypertension - Continue monitor blood pressure at home. Continue diet/meds same.  2. Hyperlipidemia - Continue diet/meds, exercise,& lifestyle modifications. Continue monitor periodic cholesterol/liver & renal functions   3. T2_NIDDM Pre-Diabetes - Continue diet, exercise, lifestyle modifications. Monitor appropriate labs.  4. Vitamin D Deficiency - Continue supplementation.  5. Hypersomnolence - Discussed slow taper to d/c fluoxetine by 20 mg every 2-3 weeks and advised try limit and begin taper her alprazolam to prn only. Also check primidone Level   Recommended regular exercise, BP monitoring, weight control, and discussed med and SE's. Recommended labs to assess and monitor clinical status. Further disposition pending results of labs.

## 2014-01-02 NOTE — Telephone Encounter (Signed)
Mailed patients prescription for Ritalin 20 mg , one tablet twice daily with breakfast and lunch, # 180 , to [facility] , [address] [place], [place] [address]

## 2014-01-02 NOTE — Patient Instructions (Signed)
Decrease Fluoxetine to 40 mg/da for 2-3 weeks,  Then to 20 mg/day  For 2-3 weeks then try stop  ++++++++++++++++++++++++++++++++++++++++++++++++++++++++++++++++++++++++++++++++  Recommend the book "The END of DIETING" by Dr Baker Janus   and the book "The END of DIABETES " by Dr Excell Seltzer  At [email or web address] - get book & Audio CD's      Being diabetic has a  300% increased risk for heart attack, stroke, cancer, and alzheimer- type vascular dementia. It is very important that you work harder with diet by avoiding all foods that are white except chicken & fish. Avoid white rice (brown & wild rice is OK), white potatoes (sweetpotatoes in moderation is OK), White bread or wheat bread or anything made out of white flour like bagels, donuts, rolls, buns, biscuits, cakes, pastries, cookies, pizza crust, and pasta (made from white flour & egg whites) - vegetarian pasta or spinach or wheat pasta is OK. Multigrain breads like Arnold's or Pepperidge Farm, or multigrain sandwich thins or flatbreads.  Diet, exercise and weight loss can reverse and cure diabetes in the early stages.  Diet, exercise and weight loss is very important in the control and prevention of complications of diabetes which affects every system in your body, ie. Brain - dementia/stroke, eyes - glaucoma/blindness, heart - heart attack/heart failure, kidneys - dialysis, stomach - gastric paralysis, intestines - malabsorption, nerves - severe painful neuritis, circulation - gangrene & loss of a leg(s), and finally cancer and Alzheimers.    I recommend avoid fried & greasy foods,  sweets/candy, white rice (brown or wild rice or Quinoa is OK), white potatoes (sweet potatoes are OK) - anything made from white flour - bagels, doughnuts, rolls, buns, biscuits,white and wheat breads, pizza crust and traditional pasta made of white flour & egg white(vegetarian pasta or spinach or wheat pasta is OK).  Multi-grain bread is OK - like multi-grain flat bread  or sandwich thins. Avoid alcohol in excess. Exercise is also important.    Eat all the vegetables you want - avoid meat, especially red meat and dairy - especially cheese.  Cheese is the most concentrated form of trans-fats which is the worst thing to clog up our arteries. Veggie cheese is OK which can be found in the fresh produce section at [facility] or [employer] or [employer]

## 2014-01-03 LAB — VITAMIN D 25 HYDROXY (VIT D DEFICIENCY, FRACTURES): Vit D, 25-Hydroxy: 87 ng/mL (ref 30–89)

## 2014-01-03 LAB — HEPATIC FUNCTION PANEL
ALBUMIN: 4.5 g/dL (ref 3.5–5.2)
ALK PHOS: 127 U/L — AB (ref 39–117)
ALT: 10 U/L (ref 0–35)
AST: 18 U/L (ref 0–37)
BILIRUBIN TOTAL: 0.4 mg/dL (ref 0.2–1.2)
Bilirubin, Direct: 0.1 mg/dL (ref 0.0–0.3)
Indirect Bilirubin: 0.3 mg/dL (ref 0.2–1.2)
Total Protein: 7 g/dL (ref 6.0–8.3)

## 2014-01-03 LAB — LIPID PANEL
Cholesterol: 182 mg/dL (ref 0–200)
HDL: 70 mg/dL (ref >39)
LDL CALC: 93 mg/dL (ref 0–99)
Total CHOL/HDL Ratio: 2.6 Ratio
Triglycerides: 95 mg/dL (ref <150)
VLDL: 19 mg/dL (ref 0–40)

## 2014-01-03 LAB — BASIC METABOLIC PANEL WITH GFR
BUN: 12 mg/dL (ref 6–23)
CHLORIDE: 100 meq/L (ref 96–112)
CO2: 30 meq/L (ref 19–32)
CREATININE: 1 mg/dL (ref 0.50–1.10)
Calcium: 9.2 mg/dL (ref 8.4–10.5)
GFR, Est African American: 69 mL/min
GFR, Est Non African American: 60 mL/min
GLUCOSE: 116 mg/dL — AB (ref 70–99)
Potassium: 3.8 mEq/L (ref 3.5–5.3)
Sodium: 140 mEq/L (ref 135–145)

## 2014-01-03 LAB — TSH: TSH: 0.944 u[IU]/mL (ref 0.350–4.500)

## 2014-01-03 LAB — HEMOGLOBIN A1C
Hgb A1c MFr Bld: 5.7 % — ABNORMAL HIGH (ref <5.7)
Mean Plasma Glucose: 117 mg/dL — ABNORMAL HIGH (ref <117)

## 2014-01-03 LAB — MAGNESIUM: Magnesium: 1.8 mg/dL (ref 1.5–2.5)

## 2014-01-03 LAB — INSULIN, FASTING: INSULIN FASTING, SERUM: 5.3 u[IU]/mL (ref 2.0–19.6)

## 2014-01-04 LAB — PRIMIDONE AND METABOLITE LEVEL
Phenobarbital: 16 mg/L (ref 15.0–40.0)
Primidone, Serum: 13.7 mg/L — ABNORMAL HIGH (ref 5.0–12.0)

## 2014-02-24 ENCOUNTER — Other Ambulatory Visit: Payer: Self-pay | Admitting: Physician Assistant

## 2014-03-23 ENCOUNTER — Other Ambulatory Visit: Payer: Self-pay | Admitting: Physician Assistant

## 2014-04-06 ENCOUNTER — Other Ambulatory Visit: Payer: Self-pay | Admitting: Physician Assistant

## 2014-04-11 ENCOUNTER — Telehealth: Payer: Self-pay

## 2014-04-11 NOTE — Telephone Encounter (Signed)
Debbie at [facility] called requesting that a referral be submitted to [facility] for patient to see Dr.Charles Willis on 04-12-14. Dx:R25.1. Referral was submitted and approved. Left message on machine to Debbie.

## 2014-04-12 ENCOUNTER — Encounter: Payer: Self-pay | Admitting: Neurology

## 2014-04-12 ENCOUNTER — Ambulatory Visit (INDEPENDENT_AMBULATORY_CARE_PROVIDER_SITE_OTHER): Payer: Commercial Managed Care - HMO | Admitting: Neurology

## 2014-04-12 VITALS — BP 131/76 | HR 50 | Ht 63.0 in | Wt 195.0 lb

## 2014-04-12 DIAGNOSIS — R251 Tremor, unspecified: Secondary | ICD-10-CM

## 2014-04-12 DIAGNOSIS — G252 Other specified forms of tremor: Principal | ICD-10-CM

## 2014-04-12 DIAGNOSIS — G250 Essential tremor: Secondary | ICD-10-CM

## 2014-04-12 NOTE — Patient Instructions (Addendum)
  May consider a deep brain stimulator for the tremor, this would entail surgery with a pacemaker in the upper chest, but tremor may be controlled with this maneuver. Tremor Tremor is a rhythmic, involuntary muscular contraction characterized by oscillations (to-and-fro movements) of a part of the body. The most common of all involuntary movements, tremor can affect various body parts such as the hands, head, facial structures, vocal cords, trunk, and legs; most tremors, however, occur in the hands. Tremor often accompanies neurological disorders associated with aging. Although the disorder is not life-threatening, it can be responsible for functional disability and social embarrassment. TREATMENT  There are many types of tremor and several ways in which tremor is classified. The most common classification is by behavioral context or position. There are five categories of tremor within this classification: resting, postural, kinetic, task-specific, and psychogenic. Resting or static tremor occurs when the muscle is at rest, for example when the hands are lying on the lap. This type of tremor is often seen in patients with Parkinson's disease. Postural tremor occurs when a patient attempts to maintain posture, such as holding the hands outstretched. Postural tremors include physiological tremor, essential tremor, tremor with basal ganglia disease (also seen in patients with Parkinson's disease), cerebellar postural tremor, tremor with peripheral neuropathy, post-traumatic tremor, and alcoholic tremor. Kinetic or intention (action) tremor occurs during purposeful movement, for example during finger-to-nose testing. Task-specific tremor appears when performing goal-oriented tasks such as handwriting, speaking, or standing. This group consists of primary writing tremor, vocal tremor, and orthostatic tremor. Psychogenic tremor occurs in both older and younger patients. The key feature of this tremor is that it  dramatically lessens or disappears when the patient is distracted. PROGNOSIS There are some treatment options available for tremor; the appropriate treatment depends on accurate diagnosis of the cause. Some tremors respond to treatment of the underlying condition, for example in some cases of psychogenic tremor treating the patient's underlying mental problem may cause the tremor to disappear. Also, patients with tremor due to Parkinson's disease may be treated with Levodopa drug therapy. Symptomatic drug therapy is available for several other tremors as well. For those cases of tremor in which there is no effective drug treatment, physical measures such as teaching the patient to brace the affected limb during the tremor are sometimes useful. Surgical intervention such as thalamotomy or deep brain stimulation may be useful in certain cases. Document Released: 02/14/2002 Document Revised: 05/19/2011 Document Reviewed: 02/24/2005 [facility] Patient Information 2015 [place], [place]. This information is not intended to replace advice given to you by your health care provider. Make sure you discuss any questions you have with your health care provider.

## 2014-04-12 NOTE — Progress Notes (Signed)
Reason for visit: Tremor  [name] is an 64 y.o. female  History of present illness:  [name] is a 64 year old right-handed white female with a history of an essential tremor that has gradually worsened over time. The patient is on Mysoline taking 250 mg twice daily. She has had blood levels done in October 2016, and the primidone levels were above the upper range of therapeutic, running around 13.7. The phenobarbital level was 16, in the low therapeutic range. The patient believes that the tremor has gradually worsened over time. The patient is unable to cook because of the tremor. She has a lot of problems with drowsiness during the day, she has found that Rockie Neighbours is extremely helpful, but her insurance company will not cover the medication. The patient is on Ritalin with a modest benefit. She takes Xanax on a regular basis for anxiety, but this also helps the tremor. She returns to this office for further evaluation. She is suffering from quite a bit of anxiety, but she also has some seasonal depression issues.  Past Medical History  Diagnosis Date  . Tremor   . Cerebrovascular disease   . Depression with anxiety   . Diabetes   . Dyslipidemia   . Hypertension   . Obesity   . Hypothyroidism   . GERD (gastroesophageal reflux disease)   . Hyperlipidemia   . Pre-diabetes   . Anemia   . Vitamin D deficiency   . Obesity     Past Surgical History  Procedure Laterality Date  . Breast reduction surgery    . 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 Gallbladder surgery    . Appendectomy    . Abdominal hysterectomy    . Laparoscopic gastric banding    . Abdominal wall mesh  removal    . Abdominal wall mesh  removal      Family History  Problem Relation Age of Onset  . Heart disease Mother   . Heart disease Father   . Diabetes Maternal Aunt     Social history:  reports that she has never smoked. She does not have any smokeless tobacco history on file. She  reports that she drinks alcohol. She reports that she does not use illicit drugs.    Allergies  Allergen Reactions  . Enalapril Cough  . Quinapril   . Trazodone And Nefazodone   . Ancef [Cefazolin]   . Ivp Dye [Iodinated Diagnostic Agents]   . Penicillins     Medications:  Current Outpatient Prescriptions on File Prior to Visit  Medication Sig Dispense Refill  . ALPRAZolam (XANAX) 1 MG tablet TAKE 1 TABLET THREE TIMES DAILY AS NEEDED 270 tablet 1  . aspirin 81 MG tablet Take 81 mg by mouth daily.    . Biotin 1 MG CAPS Take 1 capsule by mouth daily.    . bumetanide (BUMEX) 2 MG tablet TAKE 1 TABLET EVERY DAY  - SUBSTITUTED FOR  BUMEX 90 tablet 3  . Cholecalciferol (VITAMIN D3) 10000 UNITS capsule Take 10,000 Units by mouth daily.    [name] IBUPROFEN 200 MG CAPS Take 200 mg by mouth as needed. PRN    . esomeprazole (NEXIUM) 40 MG capsule Take 1 capsule (40 mg total) by mouth 2 (two) times daily before a meal. 180 capsule 4  . FLUoxetine (PROZAC) 40 MG capsule TAKE 1 CAPSULE EVERY DAY  FOR  MOOD 90 capsule 1  . levothyroxine (SYNTHROID, LEVOTHROID) 50 MCG tablet TAKE  1 TABLET DAILY BEFORE BREAKFAST. 90 tablet 1  . methylphenidate (RITALIN) 20 MG tablet Take 1 tablet (20 mg total) by mouth 2 (two) times daily with breakfast and lunch. 180 tablet 0  . potassium chloride SA (K-DUR,KLOR-CON) 20 MEQ tablet Take 20 mEq by mouth daily. 1/2 tablet daily    . pravastatin (PRAVACHOL) 40 MG tablet TAKE 1 TABLET AT BEDTIME FOR CHOLESTEROL 90 tablet 1  . primidone (MYSOLINE) 250 MG tablet TAKE ONE TABLET THREE TIMES DAILY (Patient taking differently: Take 250 mg by mouth 2 (two) times daily. ) 270 tablet 1  . PROBIOTIC CAPS Take 2 capsules by mouth daily.     . vitamin B-12 (CYANOCOBALAMIN) 100 MCG tablet Take 50 mcg by mouth daily.     No current facility-administered medications on file prior to visit.    ROS:  Out of a complete 14 system review of symptoms, the patient complains only of the  following symptoms, and all other reviewed systems are negative.  Fatigue Frequent waking, daytime sleepiness Joint pain Tremors Decreased concentration, depression, anxiety  Blood pressure 131/76, pulse 50, height 5\' 3"  (1.6 m), weight 195 lb (88.451 kg).  Physical Exam  General: The patient is alert and cooperative at the time of the examination.  Skin: No significant peripheral edema is noted.   Neurologic Exam  Mental status: The patient is oriented x 3.  Cranial nerves: Facial symmetry is present. Speech is normal, no aphasia or dysarthria is noted. Extraocular movements are full. Visual fields are full.  Motor: The patient has good strength in all 4 extremities.  Sensory examination: Soft touch sensation is symmetric on the face, arms, and legs.  Coordination: The patient has good finger-nose-finger and heel-to-shin bilaterally. The patient has prominent intention tremors with both upper extremity is, right greater than left.  Gait and station: The patient has a normal gait. Tandem gait is normal. Romberg is negative. No drift is seen.  Reflexes: Deep tendon reflexes are symmetric.   Assessment/Plan:  1. Essential tremor  The patient is having significant issues with the tremor that is worsening gradually over time. I discussed the possibility of considering deep brain simulation as a therapeutic option. If the patient decides that he would like to consider this option, I will refer her to Dr. Carles Collet for further discussion. The patient otherwise will follow-up in about 6 months.  Greater than 50% of this office visit was spent counseling the patient concerning treatment options regarding the essential tremor.  Jill Alexanders MD 04/13/2014 9:57 PM  [facility] [address] [place],  [address]  Phone [phone] Fax [phone]

## 2014-04-13 ENCOUNTER — Encounter: Payer: Self-pay | Admitting: Neurology

## 2014-04-18 ENCOUNTER — Telehealth: Payer: Self-pay | Admitting: *Deleted

## 2014-04-18 NOTE — Telephone Encounter (Signed)
Patient calling wanting to decrease the Primidone. Patient discussed this at her last visit and now she's is ready to decease.

## 2014-04-18 NOTE — Telephone Encounter (Signed)
I called patient. The patient once to taper down off of the primidone, she could potentially consider a deep brain stimulator in the future. She is to go down by one half tablet every 3 weeks until she is off the medication.

## 2014-04-25 ENCOUNTER — Encounter: Payer: Self-pay | Admitting: Internal Medicine

## 2014-04-25 ENCOUNTER — Other Ambulatory Visit: Payer: Self-pay | Admitting: Physician Assistant

## 2014-04-25 MED ORDER — BUPROPION HCL ER (SR) 150 MG PO TB12: 150.0000 mg | ORAL_TABLET | Freq: Two times a day (BID) | ORAL | Status: DC

## 2014-05-09 ENCOUNTER — Encounter: Payer: Self-pay | Admitting: Internal Medicine

## 2014-05-24 ENCOUNTER — Ambulatory Visit (INDEPENDENT_AMBULATORY_CARE_PROVIDER_SITE_OTHER): Payer: Commercial Managed Care - HMO | Admitting: Nurse Practitioner

## 2014-05-24 ENCOUNTER — Telehealth: Payer: Self-pay | Admitting: Nurse Practitioner

## 2014-05-24 ENCOUNTER — Encounter: Payer: Self-pay | Admitting: Nurse Practitioner

## 2014-05-24 VITALS — BP 139/86 | HR 61 | Ht 63.0 in | Wt 192.8 lb

## 2014-05-24 DIAGNOSIS — R251 Tremor, unspecified: Secondary | ICD-10-CM

## 2014-05-24 DIAGNOSIS — G250 Essential tremor: Secondary | ICD-10-CM

## 2014-05-24 DIAGNOSIS — G252 Other specified forms of tremor: Principal | ICD-10-CM

## 2014-05-24 NOTE — Telephone Encounter (Signed)
Referral to psych is based on who is a provider for your insurance plan. Once you find that out we can happily make a referral.

## 2014-05-24 NOTE — Progress Notes (Signed)
I have read the note, and I agree with the clinical assessment and plan.  Garhett Bernhard KEITH   

## 2014-05-24 NOTE — Progress Notes (Signed)
[facility]  PATIENT: [name] DOB: 1950-07-27   REASON FOR VISIT: worsening essential tremor, worsening depression HISTORY FROM: Patient    HISTORY OF PRESENT ILLNESS: [name], 64 year old female returns for follow-up. She was last seen in the office by Dr. Jannifer Franklin 04/12/2014. She continues to have worsening of her tremor and does not feel the Mysoline is beneficial. She has had having more difficulty with drinking something from a cup and eating She has started to titrate her dose down of Mysoline  and is currently taking 250 at night and 135m  in the morning. She has been on Topamax in the past with side effects. She also takes Xanax 1 mg 3 times. She has a significant history of depression and is currently on Wellbutrin and Prozac. She has never seen psychiatry. She has been on Ritalin in the past with modest benefit however she has discontinued since last seen. She continues to have quite a bit of anxiety along with her depression. She returns for reevaluation.  HISTORY: [name] a 64year old right-handed white female with a history of an essential tremor that has gradually worsened over time. The patient is on Mysoline taking 250 mg twice daily. She has had blood levels done in October 2016, and the primidone levels were above the upper range of therapeutic, running around 13.7. The phenobarbital level was 16, in the low therapeutic range. The patient believes that the tremor has gradually worsened over time. The patient is unable to cook because of the tremor. She has a lot of problems with drowsiness during the day, she has found that NRockie Neighboursis extremely helpful, but her insurance company will not cover the medication. The patient is on Ritalin with a modest benefit. She takes Xanax on a regular basis for anxiety, but this also helps the tremor. She returns to this office for further evaluation. She is suffering from quite a bit of anxiety, but she also has  some seasonal depression issues.   REVIEW OF SYSTEMS: Full 14 system review of systems performed and notable only for those listed, all others are neg:  Constitutional: Fatigue  Cardiovascular: neg Ear/Nose/Throat: neg  Skin: neg Eyes: neg Respiratory: neg Gastroitestinal: neg  Hematology/Lymphatic: neg  Endocrine: neg Musculoskeletal:neg Allergy/Immunology: neg Neurological: Tremors Psychiatric: Agitation, decreased concentration, depression and anxiety Sleep : Insomnia, daytime sleepiness   ALLERGIES: Allergies  Allergen Reactions  . Enalapril Cough  . Quinapril   . Trazodone And Nefazodone   . Ancef [Cefazolin]   . Ivp Dye [Iodinated Diagnostic Agents]   . Penicillins     HOME MEDICATIONS: Outpatient Prescriptions Prior to Visit  Medication Sig Dispense Refill  . ALPRAZolam (XANAX) 1 MG tablet TAKE 1 TABLET THREE TIMES DAILY AS NEEDED 270 tablet 1  . aspirin 81 MG tablet Take 81 mg by mouth daily.    . Biotin 1 MG CAPS Take 1 capsule by mouth daily.    . bumetanide (BUMEX) 2 MG tablet TAKE 1 TABLET EVERY DAY  - SUBSTITUTED FOR  BUMEX 90 tablet 3  . buPROPion (WELLBUTRIN SR) 150 MG 12 hr tablet Take 1 tablet (150 mg total) by mouth 2 (two) times daily. 60 tablet 1  . Cholecalciferol (VITAMIN D3) 10000 UNITS capsule Take 10,000 Units by mouth daily.    .Noelle PennerIBUPROFEN 200 MG CAPS Take 200 mg by mouth as needed. PRN    . esomeprazole (NEXIUM) 40 MG capsule Take 1 capsule (40 mg total) by mouth 2 (two) times daily  before a meal. 180 capsule 4  . FLUoxetine (PROZAC) 40 MG capsule TAKE 1 CAPSULE EVERY DAY  FOR  MOOD 90 capsule 1  . levothyroxine (SYNTHROID, LEVOTHROID) 50 MCG tablet TAKE 1 TABLET DAILY BEFORE BREAKFAST. 90 tablet 1  . methylphenidate (RITALIN) 20 MG tablet Take 1 tablet (20 mg total) by mouth 2 (two) times daily with breakfast and lunch. 180 tablet 0  . potassium chloride SA (K-DUR,KLOR-CON) 20 MEQ tablet Take 20 mEq by mouth daily. 1/2 tablet daily    .  pravastatin (PRAVACHOL) 40 MG tablet TAKE 1 TABLET AT BEDTIME FOR CHOLESTEROL 90 tablet 1  . primidone (MYSOLINE) 250 MG tablet TAKE ONE TABLET THREE TIMES DAILY (Patient taking differently: Take 250 mg by mouth 2 (two) times daily. ) 270 tablet 1  . PROBIOTIC CAPS Take 2 capsules by mouth daily.     . vitamin B-12 (CYANOCOBALAMIN) 100 MCG tablet Take 50 mcg by mouth daily.     No facility-administered medications prior to visit.    PAST MEDICAL HISTORY: Past Medical History  Diagnosis Date  . Tremor   . Cerebrovascular disease   . Depression with anxiety   . Diabetes   . Dyslipidemia   . Hypertension   . Obesity   . Hypothyroidism   . GERD (gastroesophageal reflux disease)   . Hyperlipidemia   . Pre-diabetes   . Anemia   . Vitamin D deficiency   . Obesity     PAST SURGICAL HISTORY: Past Surgical History  Procedure Laterality Date  . Breast reduction surgery    . 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 Gallbladder surgery    . Appendectomy    . Abdominal hysterectomy    . Laparoscopic gastric banding    . Abdominal wall mesh  removal    . Abdominal wall mesh  removal      FAMILY HISTORY: Family History  Problem Relation Age of Onset  . Heart disease Mother   . Heart disease Father   . Diabetes Maternal Aunt     SOCIAL HISTORY: History   Social History  . Marital Status: Married    Spouse Name: Marcello Moores  . Number of Children: 1  . Years of Education: 1-College   Occupational History  . retired    Social History Main Topics  . Smoking status: Never Smoker   . Smokeless tobacco: Not on file  . Alcohol Use: Yes     Comment: rarely  . Drug Use: No  . Sexual Activity: No   Other Topics Concern  . Not on file   Social History Narrative     PHYSICAL EXAM  Filed Vitals:   05/24/14 0810  BP: 139/86  Pulse: 61  Height: '5\' 3"'  (1.6 m)  Weight: 192 lb 12.8 oz (87.454 kg)   Body mass index is 34.16 kg/(m^2).  Generalized: Well developed,  obese female in no acute distress  Head: normocephalic and atraumatic,. Oropharynx benign  Neck: Supple, no carotid bruits  Musculoskeletal: No deformity   Neurological examination   Mentation: Alert oriented to time, place, history taking. Attention span and concentration appropriate. Recent and remote memory intact.  Follows all commands speech and language fluent.   Cranial nerve II-XII: Fundoscopic exam deferred .Pupils were equal round reactive to light extraocular movements were full, visual field were full on confrontational test. Facial sensation and strength were normal. hearing was intact to finger rubbing bilaterally. Uvula tongue midline. head turning and shoulder shrug were normal and  symmetric.Tongue protrusion into cheek strength was normal. Motor: normal bulk and tone, full strength in the BUE, BLE, fine finger movements normal, no pronator drift. No focal weakness Sensory: normal and symmetric to light touch,   Coordination: finger-nose-finger, heel-to-shin bilaterally, no dysmetria. She has prominent intention tremors with both upper extremities right greater than left Reflexes: Symmetric upper and lower,  plantar responses were flexor bilaterally. Gait and Station: Rising up from seated position without assistance, normal stance,  moderate stride, good arm swing, smooth turning, able to perform tiptoe, and heel walking without difficulty. Tandem gait is steady. Romberg is negative  DIAGNOSTIC DATA (LABS, IMAGING, TESTING) - I reviewed patient records, labs, notes, testing and imaging myself where available.  Lab Results  Component Value Date   WBC 4.6 01/02/2014   HGB 15.0 01/02/2014   HCT 44.4 01/02/2014   MCV 89.3 01/02/2014   PLT 187 01/02/2014      Component Value Date/Time   NA 140 01/02/2014 1342   K 3.8 01/02/2014 1342   CL 100 01/02/2014 1342   CO2 30 01/02/2014 1342   GLUCOSE 116* 01/02/2014 1342   BUN 12 01/02/2014 1342   CREATININE 1.00 01/02/2014 1342    CREATININE 0.91 11/12/2007 1151   CALCIUM 9.2 01/02/2014 1342   PROT 7.0 01/02/2014 1342   ALBUMIN 4.5 01/02/2014 1342   AST 18 01/02/2014 1342   ALT 10 01/02/2014 1342   ALKPHOS 127* 01/02/2014 1342   BILITOT 0.4 01/02/2014 1342   GFRNONAA 60 01/02/2014 1342   GFRNONAA >60 11/12/2007 1151   GFRAA 69 01/02/2014 1342   GFRAA  11/12/2007 1151    >60        The eGFR has been calculated using the MDRD equation. This calculation has not been validated in all clinical   Lab Results  Component Value Date   CHOL 182 01/02/2014   HDL 70 01/02/2014   LDLCALC 93 01/02/2014   TRIG 95 01/02/2014   CHOLHDL 2.6 01/02/2014   Lab Results  Component Value Date   HGBA1C 5.7* 01/02/2014   Lab Results  Component Value Date   DHRCBULA45 364 08/29/2013   Lab Results  Component Value Date   TSH 0.944 01/02/2014      ASSESSMENT AND PLAN  64 y.o. year old female  has a past medical history of Tremor;  Depression with anxiety; here to follow-up. She is currently on Xanax 1 mg 3 times daily which has been beneficial for her tremors as well as primidone to 1-1/2 tabs daily. She does not feel the primidone has been helpful, she has failed Topamax in the past. A beta blocker would be contraindicated due to her severe depression.   Recommend referral to movement specialist for discussion of DBS Dr Tat Reubin [place] use betablocker for tremor because of severe depression. Weighted utensils, or hand weights can be used and explained the purpose of these.   Can make referral to psych but patient wants to hold off on this at present until after DBS evaluation F/U 3 months Dennie Bible, [facility], [facility], [facility] [address] [place], [place] [address] [phone]

## 2014-05-24 NOTE — Telephone Encounter (Signed)
Spoke to patient. Gave info per CM's previous note. Gave patient names of psychiatric groups also [facility]). Patient verbalized understanding.

## 2014-05-24 NOTE — Telephone Encounter (Signed)
Patient would like to proceed with Psychiatry referral.  Please call and advise.

## 2014-05-24 NOTE — Patient Instructions (Signed)
Recommend referral to movement specialist for discussion of DBS Cannot use betablocker because of severe depression. Weighted utensils, can be used  Can make referral to psych  F/U 3 months

## 2014-05-27 ENCOUNTER — Other Ambulatory Visit: Payer: Self-pay | Admitting: Internal Medicine

## 2014-05-27 DIAGNOSIS — E782 Mixed hyperlipidemia: Secondary | ICD-10-CM

## 2014-05-27 MED ORDER — PRAVASTATIN SODIUM 40 MG PO TABS: ORAL_TABLET | ORAL | Status: DC

## 2014-05-29 ENCOUNTER — Other Ambulatory Visit: Payer: Self-pay | Admitting: Physician Assistant

## 2014-05-30 ENCOUNTER — Other Ambulatory Visit: Payer: Self-pay

## 2014-05-30 DIAGNOSIS — Z1231 Encounter for screening mammogram for malignant neoplasm of breast: Secondary | ICD-10-CM

## 2014-06-09 ENCOUNTER — Encounter: Payer: Self-pay | Admitting: Neurology

## 2014-06-09 ENCOUNTER — Ambulatory Visit (INDEPENDENT_AMBULATORY_CARE_PROVIDER_SITE_OTHER): Payer: Commercial Managed Care - HMO | Admitting: Neurology

## 2014-06-09 VITALS — BP 116/80 | HR 80 | Ht 63.0 in | Wt 193.0 lb

## 2014-06-09 DIAGNOSIS — R251 Tremor, unspecified: Secondary | ICD-10-CM | POA: Diagnosis not present

## 2014-06-09 DIAGNOSIS — R413 Other amnesia: Secondary | ICD-10-CM | POA: Diagnosis not present

## 2014-06-09 DIAGNOSIS — F331 Major depressive disorder, recurrent, moderate: Secondary | ICD-10-CM | POA: Diagnosis not present

## 2014-06-09 NOTE — Progress Notes (Signed)
Subjective:   [name] was seen in consultation in the movement disorder clinic at the request of Evlyn Courier.  Her PCP is MCKEOWN,WILLIAM DAVID, MD.  The evaluation is to see if pt is DBS candidate for her tremor.  The records that were made available to me were reviewed.    The patient is a 64 y.o. right handed female with a history of tremor.  Pt is accompanied by her husband and her best friend who supplements the history.  States that tremor started in 2007 after lap band surgery.  States that she had complications associated with this, including sepsis.  She states that she was told in [place] that she had "coded" and had anoxia and that there may have been a stroke but states that there has been some confusion about this diagnosis.  There was no tremor present before this incident.  She was in the hospital for 3 months and she noted the onset of tremor in the hospital.  She started treatment in 2008.  She was started on primidone and didn't notice a big difference.  There is a ? family hx of tremor in a brother of hers, but nothing like hers.  Pt is currently on primidone 250 mg daily and xanax 1 mg tid.  She has tried topamax in the past for tremor and she cannot remember her experience with that but thinks that it didn't help.  She has not tried beta blocker therapy as it was felt that her depression was too significant for this therapy.  Pt admits to long term depression "all my life" but "I haven't been able to shake the depression" recently.  She has been on prozac for a long time and wellbutrin was added without success.  She has never seen psychiatry but does have an appt with [facility] soon.  States that she does have total lack of focus and memory loss since her lap band surgery and cannot even boil water or will forget it on the stove.    Affected by caffeine:  No. (tea only) Affected by alcohol:  No. Affected by stress:  Yes.   Affected by fatigue:  No. Spills soup  if on spoon:  Yes.   Spills glass of liquid if full:  Yes.   Affects ADL's (tying shoes, brushing teeth, etc):  No. but does have difficulty putting on makeup  Current/Previously tried tremor medications: primidone, xanax (uses for tremor and anxiety), topamax  Current medications that may exacerbate tremor:  N/a (recently on ritalin, off now, but didn't think that it changed tremor)  Outside reports reviewed: historical medical records.  Allergies  Allergen Reactions  . Enalapril Cough  . Quinapril   . Trazodone And Nefazodone   . Ancef [Cefazolin]   . Ivp Dye [Iodinated Diagnostic Agents]   . Penicillins     Outpatient Encounter Prescriptions as of 06/09/2014  Medication Sig  . ALPRAZolam (XANAX) 1 MG tablet TAKE 1 TABLET THREE TIMES DAILY AS NEEDED  . aspirin 81 MG tablet Take 81 mg by mouth daily.  . Biotin 1 MG CAPS Take 1 capsule by mouth daily.  . Cholecalciferol (VITAMIN D3) 10000 UNITS capsule Take 10,000 Units by mouth daily.  [name] IBUPROFEN 200 MG CAPS Take 200 mg by mouth as needed. PRN  . esomeprazole (NEXIUM) 40 MG capsule Take 1 capsule (40 mg total) by mouth 2 (two) times daily before a meal. (Patient taking differently: Take 20 mg by mouth at bedtime. )  .  fexofenadine (ALLEGRA) 180 MG tablet Take 180 mg by mouth daily.  [name] Kitchen FLUoxetine (PROZAC) 40 MG capsule TAKE 1 CAPSULE EVERY DAY  FOR  MOOD  . levothyroxine (SYNTHROID, LEVOTHROID) 50 MCG tablet TAKE 1 TABLET DAILY BEFORE BREAKFAST.  [name] Kitchen potassium chloride SA (K-DUR,KLOR-CON) 20 MEQ tablet Take 20 mEq by mouth daily. 1/2 tablet daily  . pravastatin (PRAVACHOL) 40 MG tablet TAKE 1 TABLET AT BEDTIME FOR CHOLESTEROL  . primidone (MYSOLINE) 250 MG tablet TAKE ONE TABLET THREE TIMES DAILY (Patient taking differently: Take 250 mg by mouth at bedtime. )  . PROBIOTIC CAPS Take 2 capsules by mouth daily.   . vitamin B-12 (CYANOCOBALAMIN) 100 MCG tablet Take 50 mcg by mouth daily.  . [DISCONTINUED] bumetanide (BUMEX) 2 MG  tablet TAKE 1 TABLET EVERY DAY  - SUBSTITUTED FOR  BUMEX  . [DISCONTINUED] buPROPion (WELLBUTRIN SR) 150 MG 12 hr tablet Take 1 tablet (150 mg total) by mouth 2 (two) times daily.    Past Medical History  Diagnosis Date  . Tremor   . Cerebrovascular disease   . Depression with anxiety   . Diabetes   . Dyslipidemia   . Hypertension   . Obesity   . Hypothyroidism   . GERD (gastroesophageal reflux disease)   . Hyperlipidemia   . Pre-diabetes   . Anemia   . Vitamin D deficiency   . Obesity     Past Surgical History  Procedure Laterality Date  . Breast reduction surgery    . 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 Gallbladder surgery    . Appendectomy    . Abdominal hysterectomy    . Laparoscopic gastric banding    . Abdominal wall mesh  removal    . Abdominal wall mesh  removal      History   Social History  . Marital Status: Married    Spouse Name: Marcello Moores  . Number of Children: 1  . Years of Education: 1-College   Occupational History  . retired    Social History Main Topics  . Smoking status: Never Smoker   . Smokeless tobacco: Not on file  . Alcohol Use: 0.0 oz/week    0 Standard drinks or equivalent per week     Comment: rarely (1 every 2 months)  . Drug Use: No  . Sexual Activity: No   Other Topics Concern  . Not on file   Social History Narrative    Family Status  Relation Status Death Age  . Mother Deceased 38    heart disease  . Father Deceased 1    heart disease  . Brother Alive     heart transplant (1), HTN (1) prostate cancer (1)  . Son Alive     stroke    Review of Systems No neck pain.  A complete 10 system ROS was obtained and was negative apart from what is mentioned.   Objective:   VITALS:   Filed Vitals:   06/09/14 0807  BP: 116/80  Pulse: 80  Height: 5\' 3"  (1.6 m)  Weight: 193 lb (87.544 kg)   Gen:  Appears stated age and in NAD. HEENT:  Normocephalic, atraumatic. The mucous membranes are moist. The  superficial temporal arteries are without ropiness or tenderness. Cardiovascular: Regular rate and rhythm. Lungs: Clear to auscultation bilaterally. Neck: There are no carotid bruits noted bilaterally.  NEUROLOGICAL:  Orientation:  The patient is alert and oriented x 3.  Recent and remote memory are intact.  Attention span  and concentration are normal.  Able to name objects and repeat without trouble.  Fund of knowledge is appropriate Cranial nerves: There is good facial symmetry. The pupils are equal round and reactive to light bilaterally. Fundoscopic exam reveals clear disc margins bilaterally. Extraocular muscles are intact and visual fields are full to confrontational testing. Speech is fluent and clear. Soft palate rises symmetrically and there is no tongue deviation. Hearing is intact to conversational tone. Tone: Tone is good throughout. Sensation: Sensation is intact to light touch and pinprick throughout (facial, trunk, extremities). Vibration is intact at the bilateral big toe but slightly decreased. There is no extinction with double simultaneous stimulation. There is no sensory dermatomal level identified. Coordination:  The patient has no dysdiadichokinesia or dysmetria. Motor: Strength is 5/5 in the bilateral upper and lower extremities.  Shoulder shrug is equal bilaterally.  There is no pronator drift.  There are no fasciculations noted. DTR's: Deep tendon reflexes are 3/4 at the bilateral biceps, triceps, brachioradialis, patella and 2/4 at the bilateral achilles.  Plantar responses are downgoing bilaterally. Gait and Station: The patient is able to ambulate without difficulty. The patient is able to heel toe walk without any difficulty. The patient has minimal difficulty ambulating in a tandem fashion. The patient is able to stand in the Romberg position but sways with the eyes closed.   MOVEMENT EXAM: Tremor:  There is tremor in the UE, noted most significantly with action.  She  does have some degree of resting tremor, right more than left.  She has head tremor in the "yes" direction.  She has difficulty with Archimedes spirals, left greater than right.  She spills water when pouring from one glass to another when the water is in the right hand.  She does better when the water is in the left hand.       Assessment/Plan:   1.  Tremor.  -I had a long discussion with the patient and her family today.  Greater than 50% of the 45 minute visit in counseling with the patient and her family.  I do not think that she is a DBS candidate for several reasons.  First, this tremor started after a surgery for a lap band procedure in which she had multiple complications including sepsis.  The patient reported that there may have been a hypoxic event involved.  She is clear in stating that she had absolutely no tremor before the event and that she left the hospital 3 months later with significant tremor.  This would be a contraindication to DBS surgery.  In addition, she reports quite significant disabling depression, although she is not suicidal or homicidal.  Again, this is a contraindication to DBS surgery.  Finally, she has some question of whether or not she has cognitive impairment.  Because of all of these reasons, I recommended against DBS therapy.  -I did talk to the patient about the fact that she may want to talk to her primary neurologist about a possible MRI of the brain.  She was fairly hyperreflexic, although this may just be her baseline and may be physiologic hyperreflexia.  Given her complaints, however, about memory change and tremor that started after a possible hypoxic event, I think it might not be a bad idea.  She is going to talk this over with her primary neurologist.  -She and I talked about other medications that may be of value, including gabapentin, but I will leave this discussion to her primary neurologist and she will  continue to follow with Dr. Jannifer Franklin and Evlyn Courier for her treatment of tremor.  Thank you for allowing me to participate in the care of your patient.  Please feel free to contact me should there be any questions or concerns.

## 2014-06-12 ENCOUNTER — Encounter: Payer: Self-pay | Admitting: Neurology

## 2014-06-12 ENCOUNTER — Telehealth: Payer: Self-pay | Admitting: Neurology

## 2014-06-12 NOTE — Telephone Encounter (Signed)
I called the patient. Set appointment for 06/13/14 at 0800.

## 2014-06-12 NOTE — Telephone Encounter (Signed)
Patient saw Dr. Allyson Sabal on 06/09/14 and MD feels patient needs to be seen before August. Feels patient needs CT scan.  Patient's not a candidate for Deep Brain Stimulation Surgery.  Please call and advise.

## 2014-06-13 ENCOUNTER — Encounter: Payer: Self-pay | Admitting: Neurology

## 2014-06-13 ENCOUNTER — Ambulatory Visit (INDEPENDENT_AMBULATORY_CARE_PROVIDER_SITE_OTHER): Payer: Commercial Managed Care - HMO | Admitting: Neurology

## 2014-06-13 VITALS — BP 142/71 | HR 54 | Ht 63.0 in | Wt 193.6 lb

## 2014-06-13 DIAGNOSIS — G250 Essential tremor: Secondary | ICD-10-CM

## 2014-06-13 DIAGNOSIS — R251 Tremor, unspecified: Secondary | ICD-10-CM | POA: Diagnosis not present

## 2014-06-13 DIAGNOSIS — G252 Other specified forms of tremor: Principal | ICD-10-CM

## 2014-06-13 MED ORDER — GABAPENTIN 300 MG PO CAPS: 300.0000 mg | ORAL_CAPSULE | Freq: Two times a day (BID) | ORAL | Status: DC

## 2014-06-13 NOTE — Patient Instructions (Signed)

## 2014-06-13 NOTE — Telephone Encounter (Signed)
Patient was seen today and all questions were answered. I was unable to respond to the patient via MyChart.

## 2014-06-13 NOTE — Progress Notes (Signed)
Reason for visit: Tremor  [name] is an 64 y.o. female  History of present illness:  [name] is a 64 year old right-handed white female with a history of a chronic essential tremor. The patient has been seen by Dr. Carles Collet, but she was not felt to be an adequate candidate for a deep brain stimulator placement. The patient continues on Mysoline, she has been on Topamax in the past, and has not done well with this. The patient does have some mild balance issues, she denies any falls. The patient has some underlying depression, she has some issues with anxiety associated with going to grocery stores and other crowded areas. The patient indicates that her son recently had a stroke, and she is under stress related to this. The patient returns to this office for an evaluation.  Past Medical History  Diagnosis Date  . Tremor   . Cerebrovascular disease   . Depression with anxiety   . Diabetes   . Dyslipidemia   . Hypertension   . Obesity   . Hypothyroidism   . GERD (gastroesophageal reflux disease)   . Hyperlipidemia   . Pre-diabetes   . Anemia   . Vitamin D deficiency   . Obesity     Past Surgical History  Procedure Laterality Date  . Breast reduction surgery    . 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 Gallbladder surgery    . Appendectomy    . Abdominal hysterectomy    . Laparoscopic gastric banding    . Abdominal wall mesh  removal    . Abdominal wall mesh  removal      Family History  Problem Relation Age of Onset  . Heart disease Mother   . Heart disease Father   . Diabetes Maternal Aunt   . Stroke Son     Social history:  reports that she has never smoked. She does not have any smokeless tobacco history on file. She reports that she does not drink alcohol or use illicit drugs.    Allergies  Allergen Reactions  . Enalapril Cough  . Quinapril   . Trazodone And Nefazodone   . Ancef [Cefazolin]   . Ivp Dye [Iodinated Diagnostic Agents]   .  Penicillins     Medications:  Prior to Admission medications   Medication Sig Start Date End Date Taking? Authorizing Provider  ALPRAZolam [name]) 1 MG tablet TAKE 1 TABLET THREE TIMES DAILY AS NEEDED 03/23/14  Yes Vicie Mutters, PA-C  aspirin 81 MG tablet Take 81 mg by mouth daily.   Yes Historical Provider, MD  Biotin 1 MG CAPS Take 1 capsule by mouth daily.   Yes Historical Provider, MD  Cholecalciferol (VITAMIN D3) 10000 UNITS capsule Take 10,000 Units by mouth daily.   Yes Historical Provider, MD  EQ IBUPROFEN 200 MG CAPS Take 200 mg by mouth as needed. PRN 01/31/13  Yes Historical Provider, MD  esomeprazole (NEXIUM) 40 MG capsule Take 1 capsule (40 mg total) by mouth 2 (two) times daily before a meal. Patient taking differently: Take 40 mg by mouth at bedtime.  08/09/13  Yes Unk Pinto, MD  fexofenadine (ALLEGRA) 180 MG tablet Take 180 mg by mouth daily.   Yes Historical Provider, MD  FLUoxetine (PROZAC) 40 MG capsule TAKE 1 CAPSULE EVERY DAY  FOR  MOOD 04/06/14  Yes Unk Pinto, MD  levothyroxine (SYNTHROID, LEVOTHROID) 50 MCG tablet TAKE 1 TABLET DAILY BEFORE BREAKFAST. 02/24/14  Yes Unk Pinto, MD  potassium chloride SA (K-DUR,KLOR-CON) 20 MEQ tablet Take 20 mEq by mouth daily. 1/2 tablet daily   Yes Historical Provider, MD  pravastatin (PRAVACHOL) 40 MG tablet TAKE 1 TABLET AT BEDTIME FOR CHOLESTEROL 05/29/14  Yes Vicie Mutters, PA-C  primidone (MYSOLINE) 250 MG tablet TAKE ONE TABLET THREE TIMES DAILY Patient taking differently: Take 250 mg by mouth at bedtime.  10/10/13  Yes Kathrynn Ducking, MD  PROBIOTIC CAPS Take 2 capsules by mouth daily.    Yes Historical Provider, MD  vitamin B-12 (CYANOCOBALAMIN) 100 MCG tablet Take 50 mcg by mouth daily.   Yes Historical Provider, MD    ROS:  Out of a complete 14 system review of symptoms, the patient complains only of the following symptoms, and all other reviewed systems are negative.  Activity change, fatigue Runny  nose Eye itching, eye redness, blurred vision Frequent waking, daytime sleepiness Memory loss, dizziness, tremors Decreased concentration, anxiety  Blood pressure 142/71, pulse 54, height 5\' 3"  (1.6 m), weight 193 lb 9.6 oz (87.816 kg).  Physical Exam  General: The patient is alert and cooperative at the time of the examination. The patient is moderately obese.  Skin: No significant peripheral edema is noted.   Neurologic Exam  Mental status: The patient is alert and oriented x 3 at the time of the examination. The patient has apparent normal recent and remote memory, with an apparently normal attention span and concentration ability.   Cranial nerves: Facial symmetry is present. Speech is normal, no aphasia or dysarthria is noted. Extraocular movements are full. Visual fields are full.  Motor: The patient has good strength in all 4 extremities.  Sensory examination: Soft touch sensation is symmetric on the face, arms, and legs.  Coordination: The patient has good finger-nose-finger and heel-to-shin bilaterally. The patient has intention tremors with finger-nose-finger bilaterally.  Gait and station: The patient has a normal gait. Tandem gait is slightly unsteady. Romberg is negative. No drift is seen.  Reflexes: Deep tendon reflexes are symmetric.   Assessment/Plan:  1. Essential tremor  2. Anxiety and depression  3. Excessive daytime drowsiness  The patient is having side effects of the medications with drowsiness during the day. Gabapentin has been recommended by Dr. Carles Collet, and we will give the patient a prescription for this. The patient will follow-up otherwise in August 2016 for reevaluation. The patient indicates that she has been impacted with the tremor, she has had to give up cooking because of this. She cannot put on makeup around the eyes secondary to the tremor.  Jill Alexanders MD 06/13/2014 9:49 PM  [facility] [address] [place], [place] [address]  Phone [phone] Fax [phone]

## 2014-06-20 ENCOUNTER — Encounter: Payer: Self-pay | Admitting: Internal Medicine

## 2014-06-20 ENCOUNTER — Ambulatory Visit (INDEPENDENT_AMBULATORY_CARE_PROVIDER_SITE_OTHER): Payer: Commercial Managed Care - HMO | Admitting: Internal Medicine

## 2014-06-20 VITALS — BP 158/84 | HR 64 | Temp 97.3°F | Resp 16 | Ht 62.75 in | Wt 191.6 lb

## 2014-06-20 DIAGNOSIS — F32A Depression, unspecified: Secondary | ICD-10-CM

## 2014-06-20 DIAGNOSIS — F329 Major depressive disorder, single episode, unspecified: Secondary | ICD-10-CM

## 2014-06-20 DIAGNOSIS — Z1212 Encounter for screening for malignant neoplasm of rectum: Secondary | ICD-10-CM

## 2014-06-20 DIAGNOSIS — E559 Vitamin D deficiency, unspecified: Secondary | ICD-10-CM

## 2014-06-20 DIAGNOSIS — E782 Mixed hyperlipidemia: Secondary | ICD-10-CM | POA: Diagnosis not present

## 2014-06-20 DIAGNOSIS — Z79899 Other long term (current) drug therapy: Secondary | ICD-10-CM

## 2014-06-20 DIAGNOSIS — Z9181 History of falling: Secondary | ICD-10-CM

## 2014-06-20 DIAGNOSIS — R7303 Prediabetes: Secondary | ICD-10-CM

## 2014-06-20 DIAGNOSIS — E039 Hypothyroidism, unspecified: Secondary | ICD-10-CM

## 2014-06-20 DIAGNOSIS — R7309 Other abnormal glucose: Secondary | ICD-10-CM | POA: Diagnosis not present

## 2014-06-20 DIAGNOSIS — I10 Essential (primary) hypertension: Secondary | ICD-10-CM

## 2014-06-20 DIAGNOSIS — Z1331 Encounter for screening for depression: Secondary | ICD-10-CM

## 2014-06-20 DIAGNOSIS — K219 Gastro-esophageal reflux disease without esophagitis: Secondary | ICD-10-CM

## 2014-06-20 LAB — CBC WITH DIFFERENTIAL/PLATELET
BASOS ABS: 0 10*3/uL (ref 0.0–0.1)
BASOS PCT: 0 % (ref 0–1)
EOS PCT: 2 % (ref 0–5)
Eosinophils Absolute: 0.1 10*3/uL (ref 0.0–0.7)
HCT: 42.2 % (ref 36.0–46.0)
Hemoglobin: 14.7 g/dL (ref 12.0–15.0)
Lymphocytes Relative: 31 % (ref 12–46)
Lymphs Abs: 1.6 10*3/uL (ref 0.7–4.0)
MCH: 30.6 pg (ref 26.0–34.0)
MCHC: 34.8 g/dL (ref 30.0–36.0)
MCV: 87.9 fL (ref 78.0–100.0)
MONO ABS: 0.4 10*3/uL (ref 0.1–1.0)
MPV: 10.4 fL (ref 8.6–12.4)
Monocytes Relative: 8 % (ref 3–12)
Neutro Abs: 3.1 10*3/uL (ref 1.7–7.7)
Neutrophils Relative %: 59 % (ref 43–77)
Platelets: 173 10*3/uL (ref 150–400)
RBC: 4.8 MIL/uL (ref 3.87–5.11)
RDW: 13.5 % (ref 11.5–15.5)
WBC: 5.2 10*3/uL (ref 4.0–10.5)

## 2014-06-20 LAB — BASIC METABOLIC PANEL WITH GFR
BUN: 10 mg/dL (ref 6–23)
CO2: 26 mEq/L (ref 19–32)
Calcium: 9 mg/dL (ref 8.4–10.5)
Chloride: 101 mEq/L (ref 96–112)
Creat: 1.03 mg/dL (ref 0.50–1.10)
GFR, EST AFRICAN AMERICAN: 66 mL/min
GFR, Est Non African American: 58 mL/min — ABNORMAL LOW
Glucose, Bld: 87 mg/dL (ref 70–99)
POTASSIUM: 4.1 meq/L (ref 3.5–5.3)
SODIUM: 141 meq/L (ref 135–145)

## 2014-06-20 LAB — HEMOGLOBIN A1C
Hgb A1c MFr Bld: 5.8 % — ABNORMAL HIGH (ref <5.7)
Mean Plasma Glucose: 120 mg/dL — ABNORMAL HIGH (ref <117)

## 2014-06-20 LAB — HEPATIC FUNCTION PANEL
ALT: 12 U/L (ref 0–35)
AST: 15 U/L (ref 0–37)
Albumin: 4 g/dL (ref 3.5–5.2)
Alkaline Phosphatase: 116 U/L (ref 39–117)
BILIRUBIN INDIRECT: 0.4 mg/dL (ref 0.2–1.2)
Bilirubin, Direct: 0.1 mg/dL (ref 0.0–0.3)
Total Bilirubin: 0.5 mg/dL (ref 0.2–1.2)
Total Protein: 6.6 g/dL (ref 6.0–8.3)

## 2014-06-20 LAB — MAGNESIUM: Magnesium: 1.8 mg/dL (ref 1.5–2.5)

## 2014-06-20 LAB — LIPID PANEL
CHOL/HDL RATIO: 2.2 ratio
Cholesterol: 165 mg/dL (ref 0–200)
HDL: 76 mg/dL (ref >=46)
LDL Cholesterol: 73 mg/dL (ref 0–99)
TRIGLYCERIDES: 79 mg/dL (ref <150)
VLDL: 16 mg/dL (ref 0–40)

## 2014-06-20 LAB — TSH: TSH: 0.865 u[IU]/mL (ref 0.350–4.500)

## 2014-06-20 MED ORDER — RANITIDINE HCL 300 MG PO TABS: ORAL_TABLET | ORAL | Status: DC

## 2014-06-20 NOTE — Progress Notes (Addendum)
Patient ID: [name], female   DOB: [birth year], 64 y.o.   MRN: [id number]  Annual Comprehensive Examination  This very nice 64 y.o. MWF presents for complete physical.  Patient has been followed for HTN, Prediabetes, Hyperlipidemia, and Vitamin D Deficiency. Patient also has long hx/o Depression  and currently seems stable, but plans f/u at the [facility] center. Patient also has long standing GERD and is currently attempting weaning of her PPI- Nexium from 40 down to 20 mg and is made aware of the recommendations to d/c and transition to Ranitidine.    HTN predates since 77. Patient's BP has been controlled at home and patient denies any cardiac symptoms as chest pain, palpitations, shortness of breath, dizziness or ankle swelling. Today's BP is elevated at 158/84.     Patient's hyperlipidemia is controlled with diet and medications. Patient denies myalgias or other medication SE's. Last lipids were at goal -  Total Chol 165; HDL 76; LDL  73; Trig 79 on 06/20/2014.   Patient has  Morbid Obesity (BMI 34.20) and consequent prediabetes predating since Mar 2012 with A1c 5.8% and patient denies reactive hypoglycemic symptoms, visual blurring, diabetic polys, or paresthesias. Last A1c was 5.7% on 01/02/2014. 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    Patient had been on thyroid replacement for many years . Finally, patient has history of Vitamin D Deficiency of 24 in 2008 and last Vitamin D was  87 on 01/02/2014.  Medication Sig  . ALPRAZolam (XANAX) 1 MG tablet TAKE 1 TABLET THREE TIMES DAILY AS NEEDED  . aspirin 81 MG tablet Take 81 mg by mouth daily.  . Biotin 1 MG CAPS Take  1 capsule by mouth daily.  [name] Kitchen VITAMIN D3 10,000 UNITS  Take 10,000 Units by mouth daily.  [name] IBUPROFEN 200 MG CAPS Take 200 mg by mouth as needed. PRN  . fexofenadine (ALLEGRA) 180 MG tablet Take 180 mg by mouth daily.  [name] Kitchen FLUoxetine (PROZAC) 40 MG capsule TAKE 1 CAPSULE EVERY DAY  FOR  MOOD  . gabapentin  300 MG capsule Take 1 capsule (300 mg total) by mouth 2 (two) times daily.  [name] Kitchen levothyroxine  50 MCG tablet TAKE 1 TABLET DAILY BEFORE BREAKFAST.  [name] Kitchen potassium chloride / K-DUR 20 MEQ tab Take 20 mEq by mouth daily. 1/2 tablet daily  . pravastatin (PRAVACHOL) 40 MG tablet TAKE 1 TABLET AT BEDTIME FOR CHOLESTEROL  . primidone (MYSOLINE) 250 MG tablet TAKE ONE TABLET THREE TIMES DAILY (Patient taking differently: Take 250 mg by mouth at bedtime. )  . PROBIOTIC CAPS Take 2 capsules by mouth daily.   . vitamin B-12 (CYANOCOBALAMIN) 100 MCG tablet Take 50 mcg by mouth daily.  [name] Kitchen esomeprazole (NEXIUM) 40 MG capsule  Take 40 mg by mouth at bedtime. )   Allergies  Allergen Reactions  . Enalapril Cough  . Quinapril   . Trazodone And Nefazodone   . Ancef [Cefazolin]   . Ivp Dye [Iodinated Diagnostic Agents]   . Penicillins    Past Medical History  Diagnosis Date  . Tremor   . Cerebrovascular disease   . Depression with anxiety   . Diabetes   .  Dyslipidemia   . Hypertension   . Obesity   . Hypothyroidism   . GERD (gastroesophageal reflux disease)   . Hyperlipidemia   . Pre-diabetes   . Anemia   . Vitamin D deficiency   . Obesity    Health Maintenance  Topic Date Due  . TETANUS/TDAP  11/22/1968  . COLONOSCOPY  11/23/1999  . INFLUENZA VACCINE  10/09/2014  . MAMMOGRAM  06/22/2015  . ZOSTAVAX  Completed  . HIV Screening  Completed   Immunization History  Administered Date(s) Administered  . DTaP 06/09/2011  . Influenza Whole 12/05/2010, 11/09/2011  . Influenza, High Dose Seasonal PF 01/02/2014  . Zoster 06/09/2012   Past Surgical History  Procedure Laterality Date  . Breast  reduction surgery    . 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 Gallbladder surgery    . Appendectomy    . Abdominal hysterectomy    . Laparoscopic gastric banding    . Abdominal wall mesh  removal    . Abdominal wall mesh  removal     Family History  Problem Relation Age of Onset  . Heart disease Mother   . Heart disease Father   . Diabetes Maternal Aunt   . Stroke Son    History  Substance Use Topics  . Smoking status: Never Smoker   . Smokeless tobacco: Not on file  . Alcohol Use: No     Comment: rarely (1 every 2 months)    ROS Constitutional: Denies fever, chills, weight loss/gain, headaches, insomnia,  night sweats, and change in appetite. Does c/o fatigue. Eyes: Denies redness, blurred vision, diplopia, discharge, itchy, watery eyes.  ENT: Denies discharge, congestion, post nasal drip, epistaxis, sore throat, earache, hearing loss, dental pain, Tinnitus, Vertigo, Sinus pain, snoring.  Cardio: Denies chest pain, palpitations, irregular heartbeat, syncope, dyspnea, diaphoresis, orthopnea, PND, claudication, edema Respiratory: denies cough, dyspnea, DOE, pleurisy, hoarseness, laryngitis, wheezing.  Gastrointestinal: Denies dysphagia, heartburn, reflux, water brash, pain, cramps, nausea, vomiting, bloating, diarrhea, constipation, hematemesis, melena, hematochezia, jaundice, hemorrhoids Genitourinary: Denies dysuria, frequency, urgency, nocturia, hesitancy, discharge, hematuria, flank pain Breast: Breast lumps, nipple discharge, bleeding.  Musculoskeletal: Denies arthralgia, myalgia, stiffness, Jt. Swelling, pain, limp, and strain/sprain. Denies falls. Skin: Denies puritis, rash, hives, warts, acne, eczema, changing in skin lesion Neuro: No weakness, tremor, incoordination, spasms, paresthesia, pain Psychiatric: Denies confusion, memory loss, sensory loss. Denies Depression. Endocrine: Denies change in weight, skin, hair change, nocturia, and paresthesia, diabetic  polys, visual blurring, hyper / hypo glycemic episodes.  Heme/Lymph: No excessive bleeding, bruising, enlarged lymph nodes.  Physical Exam  BP 158/84   Pulse 64  Temp 97.3 F (  Resp 16  Ht 5' 2.75"   Wt 191 lb 9.6 oz     BMI 34.20   General Appearance: Well nourished and in no apparent distress. Eyes: PERRLA, EOMs, conjunctiva no swelling or erythema, normal fundi and vessels. Sinuses: No frontal/maxillary tenderness ENT/Mouth: EACs patent / TMs  nl. Nares clear without erythema, swelling, mucoid exudates. Oral hygiene is good. No erythema, swelling, or exudate. Tongue normal, non-obstructing. Tonsils not swollen or erythematous. Hearing normal.  Neck: Supple, thyroid normal. No bruits, nodes or JVD. Respiratory: Respiratory effort normal.  BS equal and clear bilateral without rales, rhonci, wheezing or stridor. Cardio: Heart sounds are normal with regular rate and rhythm and no murmurs, rubs or gallops. Peripheral pulses are normal and equal bilaterally without edema. No aortic or femoral bruits. Chest: symmetric with normal excursions and percussion. Breasts: Symmetric, without  lumps, nipple discharge, retractions, or fibrocystic changes.  Abdomen: Flat, soft, with bowl sounds. Nontender, no guarding, rebound, hernias, masses, or organomegaly.  Lymphatics: Non tender without lymphadenopathy.  Musculoskeletal: Full ROM all peripheral extremities, joint stability, 5/5 strength, and normal gait. Skin: Warm and dry without rashes, lesions, cyanosis, clubbing or  ecchymosis.  Neuro: Cranial nerves intact, reflexes equal bilaterally. Normal muscle tone, no cerebellar symptoms. Sensation intact.  Pysch: Awake and oriented X 3, normal affect, Insight and Judgment appropriate.   Assessment and Plan  1. Essential hypertension  - TSH  2. Prediabetes  - Hemoglobin A1c - Insulin, random  3. Mixed hyperlipidemia  - Lipid panel  4. Vitamin D deficiency  - Vit D  25 hydroxy (rtn  osteoporosis monitoring)  5. Obesity, morbid   6. Hypothyroidism, unspecified hypothyroidism type   7. Gastroesophageal reflux disease, esophagitis presence not specified  - ranitidine (ZANTAC) 300 MG tablet; Take 1 to 2 tablets daily to facilitate tapering off PPI's for heartburn/acid indigestion  Dispense: 90 tablet; Refill: 99  8. Depression screen   9. Depression, controlled   10. Morbid obesity   11. At low risk for fall   12. Medication management  - CBC with Differential/Platelet - BASIC METABOLIC PANEL WITH GFR - Hepatic function panel - Magnesium   Continue prudent diet as discussed, weight control, BP monitoring, regular exercise, and medications. Discussed med's effects and SE's. Screening labs and tests as requested with regular follow-up as recommended. Over 40 minutes of exam, counseling, chart review was performed.

## 2014-06-20 NOTE — Patient Instructions (Signed)
Recommend the book "The END of DIETING" by Dr Excell Seltzer   & the book "The END of DIABETES " by Dr Excell Seltzer  At [email or web address] - get book & Audio CD's      Being diabetic has a  300% increased risk for heart attack, stroke, cancer, and alzheimer- type vascular dementia. It is very important that you work harder with diet by avoiding all foods that are white. Avoid white rice (brown & wild rice is OK), white potatoes (sweetpotatoes in moderation is OK), White bread or wheat bread or anything made out of white flour like bagels, donuts, rolls, buns, biscuits, cakes, pastries, cookies, pizza crust, and pasta (made from white flour & egg whites) - vegetarian pasta or spinach or wheat pasta is OK. Multigrain breads like Arnold's or [employer], or multigrain sandwich thins or flatbreads.  Diet, exercise and weight loss can reverse and cure diabetes in the early stages.  Diet, exercise and weight loss is very important in the control and prevention of complications of diabetes which affects every system in your body, ie. Brain - dementia/stroke, eyes - glaucoma/blindness, heart - heart attack/heart failure, kidneys - dialysis, stomach - gastric paralysis, intestines - malabsorption, nerves - severe painful neuritis, circulation - gangrene & loss of a leg(s), and finally cancer and Alzheimers.    I recommend avoid fried & greasy foods,  sweets/candy, white rice (brown or wild rice or Quinoa is OK), white potatoes (sweet potatoes are OK) - anything made from white flour - bagels, doughnuts, rolls, buns, biscuits,white and wheat breads, pizza crust and traditional pasta made of white flour & egg white(vegetarian pasta or spinach or wheat pasta is OK).  Multi-grain bread is OK - like multi-grain flat bread or sandwich thins. Avoid alcohol in excess. Exercise is also important.    Eat all the vegetables you want - avoid meat, especially red meat and dairy - especially cheese.  Cheese is the most  concentrated form of trans-fats which is the worst thing to clog up our arteries. Veggie cheese is OK which can be found in the fresh produce section at [employer] or [employer] or [employer]  Preventive Care for Adults A healthy lifestyle and preventive care can promote health and wellness. Preventive health guidelines for women include the following key practices.  A routine yearly physical is a good way to check with your health care provider about your health and preventive screening. It is a chance to share any concerns and updates on your health and to receive a thorough exam.  Visit your dentist for a routine exam and preventive care every 6 months. Brush your teeth twice a day and floss once a day. Good oral hygiene prevents tooth decay and gum disease.  The frequency of eye exams is based on your age, health, family medical history, use of contact lenses, and other factors. Follow your health care provider's recommendations for frequency of eye exams.  Eat a healthy diet. Foods like vegetables, fruits, whole grains, low-fat dairy products, and lean protein foods contain the nutrients you need without too many calories. Decrease your intake of foods high in solid fats, added sugars, and salt. Eat the right amount of calories for you.Get information about a proper diet from your health care provider, if necessary.  Regular physical exercise is one of the most important things you can do for your health. Most adults should get at least 150 minutes of moderate-intensity exercise (any activity that increases your heart rate and  causes you to sweat) each week. In addition, most adults need muscle-strengthening exercises on 2 or more days a week.  Maintain a healthy weight. The body mass index (BMI) is a screening tool to identify possible weight problems. It provides an estimate of body fat based on height and weight. Your health care provider can find your BMI and can help you achieve or  maintain a healthy weight.For adults 20 years and older:  A BMI below 18.5 is considered underweight.  A BMI of 18.5 to 24.9 is normal.  A BMI of 25 to 29.9 is considered overweight.  A BMI of 30 and above is considered obese.  Maintain normal blood lipids and cholesterol levels by exercising and minimizing your intake of saturated fat. Eat a balanced diet with plenty of fruit and vegetables. Blood tests for lipids and cholesterol should begin at age 57 and be repeated every 5 years. If your lipid or cholesterol levels are high, you are over 50, or you are at high risk for heart disease, you may need your cholesterol levels checked more frequently.Ongoing high lipid and cholesterol levels should be treated with medicines if diet and exercise are not working.  If you smoke, find out from your health care provider how to quit. If you do not use tobacco, do not start.  Lung cancer screening is recommended for adults aged 52-80 years who are at high risk for developing lung cancer because of a history of smoking. A yearly low-dose CT scan of the lungs is recommended for people who have at least a 30-pack-year history of smoking and are a current smoker or have quit within the past 15 years. A pack year of smoking is smoking an average of 1 pack of cigarettes a day for 1 year (for example: 1 pack a day for 30 years or 2 packs a day for 15 years). Yearly screening should continue until the smoker has stopped smoking for at least 15 years. Yearly screening should be stopped for people who develop a health problem that would prevent them from having lung cancer treatment.  High blood pressure causes heart disease and increases the risk of stroke. Your blood pressure should be checked at least every 1 to 2 years. Ongoing high blood pressure should be treated with medicines if weight loss and exercise do not work.  If you are 43-25 years old, ask your health care provider if you should take aspirin to  prevent strokes.  Diabetes screening involves taking a blood sample to check your fasting blood sugar level. This should be done once every 3 years, after age 36, if you are within normal weight and without risk factors for diabetes. Testing should be considered at a younger age or be carried out more frequently if you are overweight and have at least 1 risk factor for diabetes.  Breast cancer screening is essential preventive care for women. You should practice "breast self-awareness." This means understanding the normal appearance and feel of your breasts and may include breast self-examination. Any changes detected, no matter how small, should be reported to a health care provider. Women in their 73s and 30s should have a clinical breast exam (CBE) by a health care provider as part of a regular health exam every 1 to 3 years. After age 12, women should have a CBE every year. Starting at age 75, women should consider having a mammogram (breast X-ray test) every year. Women who have a family history of breast cancer should talk to  their health care provider about genetic screening. Women at a high risk of breast cancer should talk to their health care providers about having an MRI and a mammogram every year.  Breast cancer gene (BRCA)-related cancer risk assessment is recommended for women who have family members with BRCA-related cancers. BRCA-related cancers include breast, ovarian, tubal, and peritoneal cancers. Having family members with these cancers may be associated with an increased risk for harmful changes (mutations) in the breast cancer genes BRCA1 and BRCA2. Results of the assessment will determine the need for genetic counseling and BRCA1 and BRCA2 testing.  Routine pelvic exams to screen for cancer are no longer recommended for nonpregnant women who are considered low risk for cancer of the pelvic organs (ovaries, uterus, and vagina) and who do not have symptoms. Ask your health care provider  if a screening pelvic exam is right for you.  If you have had past treatment for cervical cancer or a condition that could lead to cancer, you need Pap tests and screening for cancer for at least 20 years after your treatment. If Pap tests have been discontinued, your risk factors (such as having a new sexual partner) need to be reassessed to determine if screening should be resumed. Some women have medical problems that increase the chance of getting cervical cancer. In these cases, your health care provider may recommend more frequent screening and Pap tests.  Colorectal cancer can be detected and often prevented. Most routine colorectal cancer screening begins at the age of 63 years and continues through age 41 years. However, your health care provider may recommend screening at an earlier age if you have risk factors for colon cancer. On a yearly basis, your health care provider may provide home test kits to check for hidden blood in the stool. Use of a small camera at the end of a tube, to directly examine the colon (sigmoidoscopy or colonoscopy), can detect the earliest forms of colorectal cancer. Talk to your health care provider about this at age 57, when routine screening begins. Direct exam of the colon should be repeated every 5-10 years through age 65 years, unless early forms of pre-cancerous polyps or small growths are found.  Hepatitis C blood testing is recommended for all people born from [birth year] through 1965 and any individual with known risks for hepatitis C.  Pra  Osteoporosis is a disease in which the bones lose minerals and strength with aging. This can result in serious bone fractures or breaks. The risk of osteoporosis can be identified using a bone density scan. Women ages 78 years and over and women at risk for fractures or osteoporosis should discuss screening with their health care providers. Ask your health care provider whether you should take a calcium supplement or vitamin D  to reduce the rate of osteoporosis.  Menopause can be associated with physical symptoms and risks. Hormone replacement therapy is available to decrease symptoms and risks. You should talk to your health care provider about whether hormone replacement therapy is right for you.  Use sunscreen. Apply sunscreen liberally and repeatedly throughout the day. You should seek shade when your shadow is shorter than you. Protect yourself by wearing long sleeves, pants, a wide-brimmed hat, and sunglasses year round, whenever you are outdoors.  Once a month, do a whole body skin exam, using a mirror to look at the skin on your back. Tell your health care provider of new moles, moles that have irregular borders, moles that are larger than a pencil  eraser, or moles that have changed in shape or color.  Stay current with required vaccines (immunizations).  Influenza vaccine. All adults should be immunized every year.  Tetanus, diphtheria, and acellular pertussis (Td, Tdap) vaccine. Pregnant women should receive 1 dose of Tdap vaccine during each pregnancy. The dose should be obtained regardless of the length of time since the last dose. Immunization is preferred during the 27th-36th week of gestation. An adult who has not previously received Tdap or who does not know her vaccine status should receive 1 dose of Tdap. This initial dose should be followed by tetanus and diphtheria toxoids (Td) booster doses every 10 years. Adults with an unknown or incomplete history of completing a 3-dose immunization series with Td-containing vaccines should begin or complete a primary immunization series including a Tdap dose. Adults should receive a Td booster every 10 years.  Varicella vaccine. An adult without evidence of immunity to varicella should receive 2 doses or a second dose if she has previously received 1 dose. Pregnant females who do not have evidence of immunity should receive the first dose after pregnancy. This first  dose should be obtained before leaving the health care facility. The second dose should be obtained 4-8 weeks after the first dose.  Human papillomavirus (HPV) vaccine. Females aged 13-26 years who have not received the vaccine previously should obtain the 3-dose series. The vaccine is not recommended for use in pregnant females. However, pregnancy testing is not needed before receiving a dose. If a female is found to be pregnant after receiving a dose, no treatment is needed. In that case, the remaining doses should be delayed until after the pregnancy. Immunization is recommended for any person with an immunocompromised condition through the age of 24 years if she did not get any or all doses earlier. During the 3-dose series, the second dose should be obtained 4-8 weeks after the first dose. The third dose should be obtained 24 weeks after the first dose and 16 weeks after the second dose.  Zoster vaccine. One dose is recommended for adults aged 52 years or older unless certain conditions are present.  Measles, mumps, and rubella (MMR) vaccine. Adults born before [birth year] generally are considered immune to measles and mumps. Adults born in [birth year] or later should have 1 or more doses of MMR vaccine unless there is a contraindication to the vaccine or there is laboratory evidence of immunity to each of the three diseases. A routine second dose of MMR vaccine should be obtained at least 28 days after the first dose for students attending postsecondary schools, health care workers, or international travelers. People who received inactivated measles vaccine or an unknown type of measles vaccine during 1963-1967 should receive 2 doses of MMR vaccine. People who received inactivated mumps vaccine or an unknown type of mumps vaccine before 1979 and are at high risk for mumps infection should consider immunization with 2 doses of MMR vaccine. For females of childbearing age, rubella immunity should be determined. If there  is no evidence of immunity, females who are not pregnant should be vaccinated. If there is no evidence of immunity, females who are pregnant should delay immunization until after pregnancy. Unvaccinated health care workers born before [birth year] who lack laboratory evidence of measles, mumps, or rubella immunity or laboratory confirmation of disease should consider measles and mumps immunization with 2 doses of MMR vaccine or rubella immunization with 1 dose of MMR vaccine.  Pneumococcal 13-valent conjugate (PCV13) vaccine. When indicated, a person who  is uncertain of her immunization history and has no record of immunization should receive the PCV13 vaccine. An adult aged 68 years or older who has certain medical conditions and has not been previously immunized should receive 1 dose of PCV13 vaccine. This PCV13 should be followed with a dose of pneumococcal polysaccharide (PPSV23) vaccine. The PPSV23 vaccine dose should be obtained at least 8 weeks after the dose of PCV13 vaccine. An adult aged 60 years or older who has certain medical conditions and previously received 1 or more doses of PPSV23 vaccine should receive 1 dose of PCV13. The PCV13 vaccine dose should be obtained 1 or more years after the last PPSV23 vaccine dose.    Pneumococcal polysaccharide (PPSV23) vaccine. When PCV13 is also indicated, PCV13 should be obtained first. All adults aged 42 years and older should be immunized. An adult younger than age 80 years who has certain medical conditions should be immunized. Any person who resides in a nursing home or long-term care facility should be immunized. An adult smoker should be immunized. People with an immunocompromised condition and certain other conditions should receive both PCV13 and PPSV23 vaccines. People with human immunodeficiency virus (HIV) infection should be immunized as soon as possible after diagnosis. Immunization during chemotherapy or radiation therapy should be avoided. Routine use  of PPSV23 vaccine is not recommended for American Indians, [place] Natives, or people younger than 65 years unless there are medical conditions that require PPSV23 vaccine. When indicated, people who have unknown immunization and have no record of immunization should receive PPSV23 vaccine. One-time revaccination 5 years after the first dose of PPSV23 is recommended for people aged 19-64 years who have chronic kidney failure, nephrotic syndrome, asplenia, or immunocompromised conditions. People who received 1-2 doses of PPSV23 before age 70 years should receive another dose of PPSV23 vaccine at age 68 years or later if at least 5 years have passed since the previous dose. Doses of PPSV23 are not needed for people immunized with PPSV23 at or after age 38 years.  Preventive Services / Frequency   Ages 9 to 79 years  Blood pressure check.  Lipid and cholesterol check.  Lung cancer screening. / Every year if you are aged 51-80 years and have a 30-pack-year history of smoking and currently smoke or have quit within the past 15 years. Yearly screening is stopped once you have quit smoking for at least 15 years or develop a health problem that would prevent you from having lung cancer treatment.  Clinical breast exam.** / Every year after age 32 years.  BRCA-related cancer risk assessment.** / For women who have family members with a BRCA-related cancer (breast, ovarian, tubal, or peritoneal cancers).  Mammogram.** / Every year beginning at age 21 years and continuing for as long as you are in good health. Consult with your health care provider.  Pap test.** / Every 3 years starting at age 56 years through age 72 or 3 years with a history of 3 consecutive normal Pap tests.  HPV screening.** / Every 3 years from ages 39 years through ages 22 to 61 years with a history of 3 consecutive normal Pap tests.  Fecal occult blood test (FOBT) of stool. / Every year beginning at age 18 years and continuing  until age 70 years. You may not need to do this test if you get a colonoscopy every 10 years.  Flexible sigmoidoscopy or colonoscopy.** / Every 5 years for a flexible sigmoidoscopy or every 10 years for a colonoscopy beginning  at age 78 years and continuing until age 65 years.  Hepatitis C blood test.** / For all people born from [birth year] through 1966 and any individual with known risks for hepatitis C.  Skin self-exam. / Monthly.  Influenza vaccine. / Every year.  Tetanus, diphtheria, and acellular pertussis (Tdap/Td) vaccine.** / Consult your health care provider. Pregnant women should receive 1 dose of Tdap vaccine during each pregnancy. 1 dose of Td every 10 years.  Varicella vaccine.** / Consult your health care provider. Pregnant females who do not have evidence of immunity should receive the first dose after pregnancy.  Zoster vaccine.** / 1 dose for adults aged 64 years or older.  Pneumococcal 13-valent conjugate (PCV13) vaccine.** / Consult your health care provider.  Pneumococcal polysaccharide (PPSV23) vaccine.** / 1 to 2 doses if you smoke cigarettes or if you have certain conditions.  Meningococcal vaccine.** / Consult your health care provider.  Hepatitis A vaccine.** / Consult your health care provider.  Hepatitis B vaccine.** / Consult your health care provider. Screening for abdominal aortic aneurysm (AAA)  by ultrasound is recommended for people over 50 who have history of high blood pressure or who are current or former smokers.

## 2014-06-21 LAB — VITAMIN D 25 HYDROXY (VIT D DEFICIENCY, FRACTURES): Vit D, 25-Hydroxy: 64 ng/mL (ref 30–100)

## 2014-06-21 LAB — INSULIN, RANDOM: INSULIN: 6.4 u[IU]/mL (ref 2.0–19.6)

## 2014-06-26 ENCOUNTER — Ambulatory Visit
Admission: RE | Admit: 2014-06-26 | Discharge: 2014-06-26 | Disposition: A | Discharge status: Home / Self Care | Payer: Commercial Managed Care - HMO | Source: Ambulatory Visit

## 2014-06-26 DIAGNOSIS — Z1231 Encounter for screening mammogram for malignant neoplasm of breast: Secondary | ICD-10-CM

## 2014-07-02 NOTE — Addendum Note (Signed)
Addended by: Amarya Kuehl A on: 07/02/2014 02:51 PM   Modules accepted: Orders

## 2014-07-03 DIAGNOSIS — F4323 Adjustment disorder with mixed anxiety and depressed mood: Secondary | ICD-10-CM | POA: Diagnosis not present

## 2014-07-07 ENCOUNTER — Other Ambulatory Visit: Payer: Self-pay | Admitting: Internal Medicine

## 2014-07-17 ENCOUNTER — Telehealth: Payer: Self-pay | Admitting: Neurology

## 2014-07-17 MED ORDER — PRIMIDONE 250 MG PO TABS: 250.0000 mg | ORAL_TABLET | Freq: Two times a day (BID) | ORAL | Status: DC

## 2014-07-17 NOTE — Telephone Encounter (Signed)
OV note from 03/16 says: The patient is on Mysoline taking 250 mg twice daily, but also says patient takes 1 1/2 daily.   I called back to clarify the pharmacy and dose.  Patient said she would like Rx sent to [facility] and uses one tab twice daily.

## 2014-07-17 NOTE — Telephone Encounter (Signed)
Patient called requesting a refill on Rx.primidone (MYSOLINE) 250 MG tablet. Please call and advise.

## 2014-07-31 DIAGNOSIS — F4323 Adjustment disorder with mixed anxiety and depressed mood: Secondary | ICD-10-CM | POA: Diagnosis not present

## 2014-08-13 ENCOUNTER — Other Ambulatory Visit: Payer: Self-pay | Admitting: Internal Medicine

## 2014-08-14 ENCOUNTER — Encounter: Payer: Self-pay | Admitting: Internal Medicine

## 2014-08-14 ENCOUNTER — Encounter: Payer: Self-pay | Admitting: Physician Assistant

## 2014-08-14 MED ORDER — LEVOTHYROXINE SODIUM 50 MCG PO TABS: ORAL_TABLET | ORAL | Status: AC

## 2014-09-25 DIAGNOSIS — F4323 Adjustment disorder with mixed anxiety and depressed mood: Secondary | ICD-10-CM | POA: Diagnosis not present

## 2014-09-26 ENCOUNTER — Encounter: Payer: Self-pay | Admitting: Internal Medicine

## 2014-09-27 ENCOUNTER — Encounter: Payer: Self-pay | Admitting: Internal Medicine

## 2014-09-27 ENCOUNTER — Other Ambulatory Visit: Payer: Self-pay | Admitting: Internal Medicine

## 2014-09-27 DIAGNOSIS — K219 Gastro-esophageal reflux disease without esophagitis: Secondary | ICD-10-CM

## 2014-10-05 ENCOUNTER — Ambulatory Visit: Payer: Self-pay | Admitting: Internal Medicine

## 2014-10-10 ENCOUNTER — Encounter: Payer: Self-pay | Admitting: Internal Medicine

## 2014-10-10 ENCOUNTER — Ambulatory Visit: Payer: Self-pay | Admitting: Internal Medicine

## 2014-10-10 ENCOUNTER — Ambulatory Visit (INDEPENDENT_AMBULATORY_CARE_PROVIDER_SITE_OTHER): Payer: Commercial Managed Care - HMO | Admitting: Internal Medicine

## 2014-10-10 ENCOUNTER — Encounter: Payer: Self-pay | Admitting: Nurse Practitioner

## 2014-10-10 ENCOUNTER — Ambulatory Visit (INDEPENDENT_AMBULATORY_CARE_PROVIDER_SITE_OTHER): Payer: Commercial Managed Care - HMO | Admitting: Nurse Practitioner

## 2014-10-10 VITALS — BP 118/64 | HR 76 | Ht 63.0 in | Wt 192.1 lb

## 2014-10-10 VITALS — BP 108/60 | HR 56 | Temp 98.2°F | Resp 18 | Ht 62.75 in | Wt 193.0 lb

## 2014-10-10 DIAGNOSIS — E782 Mixed hyperlipidemia: Secondary | ICD-10-CM

## 2014-10-10 DIAGNOSIS — E039 Hypothyroidism, unspecified: Secondary | ICD-10-CM

## 2014-10-10 DIAGNOSIS — R7309 Other abnormal glucose: Secondary | ICD-10-CM

## 2014-10-10 DIAGNOSIS — K219 Gastro-esophageal reflux disease without esophagitis: Secondary | ICD-10-CM | POA: Diagnosis not present

## 2014-10-10 DIAGNOSIS — E559 Vitamin D deficiency, unspecified: Secondary | ICD-10-CM

## 2014-10-10 DIAGNOSIS — Z79899 Other long term (current) drug therapy: Secondary | ICD-10-CM | POA: Diagnosis not present

## 2014-10-10 DIAGNOSIS — Z9889 Other specified postprocedural states: Secondary | ICD-10-CM

## 2014-10-10 DIAGNOSIS — I10 Essential (primary) hypertension: Secondary | ICD-10-CM | POA: Diagnosis not present

## 2014-10-10 DIAGNOSIS — R7303 Prediabetes: Secondary | ICD-10-CM

## 2014-10-10 DIAGNOSIS — R1030 Lower abdominal pain, unspecified: Secondary | ICD-10-CM

## 2014-10-10 DIAGNOSIS — R109 Unspecified abdominal pain: Secondary | ICD-10-CM | POA: Diagnosis not present

## 2014-10-10 LAB — BASIC METABOLIC PANEL WITH GFR
BUN: 15 mg/dL (ref 7–25)
CALCIUM: 8.9 mg/dL (ref 8.6–10.4)
CO2: 31 mmol/L (ref 20–31)
Chloride: 99 mmol/L (ref 98–110)
Creat: 0.99 mg/dL (ref 0.50–0.99)
GFR, EST AFRICAN AMERICAN: 70 mL/min (ref >=60)
GFR, Est Non African American: 60 mL/min (ref >=60)
Glucose, Bld: 85 mg/dL (ref 65–99)
Potassium: 3.8 mmol/L (ref 3.5–5.3)
SODIUM: 140 mmol/L (ref 135–146)

## 2014-10-10 LAB — LIPID PANEL
CHOL/HDL RATIO: 2.7 ratio (ref <=5.0)
Cholesterol: 161 mg/dL (ref 125–200)
HDL: 59 mg/dL (ref >=46)
LDL CALC: 83 mg/dL (ref <130)
TRIGLYCERIDES: 93 mg/dL (ref <150)
VLDL: 19 mg/dL (ref <30)

## 2014-10-10 LAB — CBC WITH DIFFERENTIAL/PLATELET
BASOS ABS: 0.1 10*3/uL (ref 0.0–0.1)
BASOS PCT: 1 % (ref 0–1)
Eosinophils Absolute: 0.1 10*3/uL (ref 0.0–0.7)
Eosinophils Relative: 2 % (ref 0–5)
HEMATOCRIT: 41.9 % (ref 36.0–46.0)
Hemoglobin: 13.8 g/dL (ref 12.0–15.0)
LYMPHS ABS: 1.7 10*3/uL (ref 0.7–4.0)
LYMPHS PCT: 33 % (ref 12–46)
MCH: 29.6 pg (ref 26.0–34.0)
MCHC: 32.9 g/dL (ref 30.0–36.0)
MCV: 89.9 fL (ref 78.0–100.0)
MONO ABS: 0.4 10*3/uL (ref 0.1–1.0)
MONOS PCT: 7 % (ref 3–12)
MPV: 10.4 fL (ref 8.6–12.4)
NEUTROS ABS: 3 10*3/uL (ref 1.7–7.7)
Neutrophils Relative %: 57 % (ref 43–77)
Platelets: 216 10*3/uL (ref 150–400)
RBC: 4.66 MIL/uL (ref 3.87–5.11)
RDW: 13.4 % (ref 11.5–15.5)
WBC: 5.3 10*3/uL (ref 4.0–10.5)

## 2014-10-10 LAB — HEPATIC FUNCTION PANEL
ALT: 12 U/L (ref 6–29)
AST: 15 U/L (ref 10–35)
Albumin: 3.7 g/dL (ref 3.6–5.1)
Alkaline Phosphatase: 78 U/L (ref 33–130)
BILIRUBIN DIRECT: 0.1 mg/dL (ref <=0.2)
BILIRUBIN INDIRECT: 0.2 mg/dL (ref 0.2–1.2)
BILIRUBIN TOTAL: 0.3 mg/dL (ref 0.2–1.2)
Total Protein: 6.3 g/dL (ref 6.1–8.1)

## 2014-10-10 LAB — HEMOGLOBIN A1C
Hgb A1c MFr Bld: 5.8 % — ABNORMAL HIGH (ref <5.7)
Mean Plasma Glucose: 120 mg/dL — ABNORMAL HIGH (ref <117)

## 2014-10-10 LAB — MAGNESIUM: Magnesium: 1.9 mg/dL (ref 1.5–2.5)

## 2014-10-10 LAB — TSH: TSH: 0.818 u[IU]/mL (ref 0.350–4.500)

## 2014-10-10 NOTE — Patient Instructions (Signed)
Abdominal Pain, Women °Abdominal (stomach, pelvic, or belly) pain can be caused by many things. It is important to tell your doctor: °· The location of the pain. °· Does it come and go or is it present all the time? °· Are there things that start the pain (eating certain foods, exercise)? °· Are there other symptoms associated with the pain (fever, nausea, vomiting, diarrhea)? °All of this is helpful to know when trying to find the cause of the pain. °CAUSES  °· Stomach: virus or bacteria infection, or ulcer. °· Intestine: appendicitis (inflamed appendix), regional ileitis (Crohn's disease), ulcerative colitis (inflamed colon), irritable bowel syndrome, diverticulitis (inflamed diverticulum of the colon), or cancer of the stomach or intestine. °· Gallbladder disease or stones in the gallbladder. °· Kidney disease, kidney stones, or infection. °· Pancreas infection or cancer. °· Fibromyalgia (pain disorder). °· Diseases of the female organs: °¨ Uterus: fibroid (non-cancerous) tumors or infection. °¨ Fallopian tubes: infection or tubal pregnancy. °¨ Ovary: cysts or tumors. °¨ Pelvic adhesions (scar tissue). °¨ Endometriosis (uterus lining tissue growing in the pelvis and on the pelvic organs). °¨ Pelvic congestion syndrome (female organs filling up with blood just before the menstrual period). °¨ Pain with the menstrual period. °¨ Pain with ovulation (producing an egg). °¨ Pain with an IUD (intrauterine device, birth control) in the uterus. °¨ Cancer of the female organs. °· Functional pain (pain not caused by a disease, may improve without treatment). °· Psychological pain. °· Depression. °DIAGNOSIS  °Your doctor will decide the seriousness of your pain by doing an examination. °· Blood tests. °· X-rays. °· Ultrasound. °· CT scan (computed tomography, special type of X-ray). °· MRI (magnetic resonance imaging). °· Cultures, for infection. °· Barium enema (dye inserted in the large intestine, to better view it with  X-rays). °· Colonoscopy (looking in intestine with a lighted tube). °· Laparoscopy (minor surgery, looking in abdomen with a lighted tube). °· Major abdominal exploratory surgery (looking in abdomen with a large incision). °TREATMENT  °The treatment will depend on the cause of the pain.  °· Many cases can be observed and treated at home. °· Over-the-counter medicines recommended by your caregiver. °· Prescription medicine. °· Antibiotics, for infection. °· Birth control pills, for painful periods or for ovulation pain. °· Hormone treatment, for endometriosis. °· Nerve blocking injections. °· Physical therapy. °· Antidepressants. °· Counseling with a psychologist or psychiatrist. °· Minor or major surgery. °HOME CARE INSTRUCTIONS  °· Do not take laxatives, unless directed by your caregiver. °· Take over-the-counter pain medicine only if ordered by your caregiver. Do not take aspirin because it can cause an upset stomach or bleeding. °· Try a clear liquid diet (broth or water) as ordered by your caregiver. Slowly move to a bland diet, as tolerated, if the pain is related to the stomach or intestine. °· Have a thermometer and take your temperature several times a day, and record it. °· Bed rest and sleep, if it helps the pain. °· Avoid sexual intercourse, if it causes pain. °· Avoid stressful situations. °· Keep your follow-up appointments and tests, as your caregiver orders. °· If the pain does not go away with medicine or surgery, you may try: °¨ Acupuncture. °¨ Relaxation exercises (yoga, meditation). °¨ Group therapy. °¨ Counseling. °SEEK MEDICAL CARE IF:  °· You notice certain foods cause stomach pain. °· Your home care treatment is not helping your pain. °· You need stronger pain medicine. °· You want your IUD removed. °· You feel faint or   lightheaded. °· You develop nausea and vomiting. °· You develop a rash. °· You are having side effects or an allergy to your medicine. °SEEK IMMEDIATE MEDICAL CARE IF:  °· Your  pain does not go away or gets worse. °· You have a fever. °· Your pain is felt only in portions of the abdomen. The right side could possibly be appendicitis. The left lower portion of the abdomen could be colitis or diverticulitis. °· You are passing blood in your stools (bright red or black tarry stools, with or without vomiting). °· You have blood in your urine. °· You develop chills, with or without a fever. °· You pass out. °MAKE SURE YOU:  °· Understand these instructions. °· Will watch your condition. °· Will get help right away if you are not doing well or get worse. °Document Released: 12/22/2006 Document Revised: 07/11/2013 Document Reviewed: 01/11/2009 °ExitCare® Patient Information ©2015 [employer]. This information is not intended to replace advice given to you by your health care provider. Make sure you discuss any questions you have with your health care provider. ° °

## 2014-10-10 NOTE — Patient Instructions (Signed)
We have scheduled for an upper GI test at [facility], [name]. Go to the [facility], [address]. They do have [place] parking.   Have nothing by mouth past midnight. Arrive at 8:15 am.

## 2014-10-10 NOTE — Progress Notes (Addendum)
HPI : Patient is a 64 year old female referred by PCP for GERD. She has a very complex surgical history. Patient is status post [employer] fundoplication approximately 25 years ago. In 2007 patient went for gastric band placement but during surgery was found to have a giant upper abdominal midline incisional hernia which couldn't be repaired at time of lap band. Patient subsequently developed a SBO,  underwent laparoscopy with repair of multiple ventral abdominal hernias and a hiatal hernia. Post-op course was complicated by an infected hematoma requiring laparotomy and a wound vac.  At some point patient developed cardiopulmonary arrest. She required several additional wound surgeries but these were done in [place].   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   Patient is here for evaluation of GERD. She did great for about 10 years after fundoplication but then began needing acid blockers again. Patient was evaluated by [facility] a couple of years ago. Told EGD was okay. She was unhappy with care there. Until recently patient was recently taking 60mg  of Nexium daily. She discontinued it two months ago because of potential side effects of PPI. She began Zantac and Tums but because of breakthrough symptoms had to restart PPI last week.  She has regurgitation of gastric contents, bending over especially problematic. Already feeling better on PPI.  Past Medical History  Diagnosis Date  . Tremor   . Cerebrovascular disease   . Depression with anxiety   . Diabetes   . Dyslipidemia   . Hypertension   . Obesity   . Hypothyroidism   . GERD (gastroesophageal reflux disease)   . Hyperlipidemia   . Pre-diabetes   . Anemia   . Vitamin D deficiency   . Obesity     Family History  Problem Relation Age of Onset  . Heart disease Mother   . Heart disease Father   . Diabetes Maternal Aunt   . Stroke Son    History    Substance Use Topics  . Smoking status: Never Smoker   . Smokeless tobacco: Not on file  . Alcohol Use: No     Comment: rarely (1 every 2 months)   Current Outpatient Prescriptions  Medication Sig Dispense Refill  . ALPRAZolam (XANAX) 1 MG tablet TAKE 1 TABLET THREE TIMES DAILY AS NEEDED 270 tablet 1  . aspirin 81 MG tablet Take 81 mg by mouth daily.    . Biotin 1 MG CAPS Take 1 capsule by mouth daily.    . Cholecalciferol (VITAMIN D3) 10000 UNITS capsule Take 10,000 Units by mouth daily.    [name] IBUPROFEN 200 MG CAPS Take 200 mg by mouth as needed. PRN    . fexofenadine (ALLEGRA) 180 MG tablet Take 180 mg by mouth daily.    [name] Kitchen FLUoxetine (PROZAC) 20 MG capsule Take 20 mg by mouth daily.    [name] Kitchen FLUoxetine (PROZAC) 40 MG capsule TAKE 1 CAPSULE EVERY DAY FOR MOOD 90 capsule 1  . gabapentin (NEURONTIN) 300 MG capsule Take 1 capsule (300 mg total) by mouth 2 (two) times daily. 60 capsule 1  . levothyroxine (SYNTHROID, LEVOTHROID) 50 MCG tablet TAKE 1 TABLET DAILY BEFORE BREAKFAST. 90 tablet 1  . NEXIUM 20 MG capsule     . potassium chloride SA (K-DUR,KLOR-CON) 20 MEQ tablet Take 20 mEq by mouth daily. 1/2 tablet daily    . pravastatin (PRAVACHOL) 40 MG tablet TAKE 1 TABLET AT BEDTIME FOR CHOLESTEROL 90 tablet 1  .  primidone (MYSOLINE) 250 MG tablet Take 1 tablet (250 mg total) by mouth 2 (two) times daily. 180 tablet 1  . PROBIOTIC CAPS Take 2 capsules by mouth daily.     . ranitidine (ZANTAC) 300 MG tablet Take 1 to 2 tablets daily to facilitate tapering off PPI's for heartburn/acid indigestion 90 tablet 99  . vitamin B-12 (CYANOCOBALAMIN) 100 MCG tablet Take 50 mcg by mouth daily.     No current facility-administered medications for this visit.   Allergies  Allergen Reactions  . Enalapril Cough  . Quinapril   . Trazodone And Nefazodone   . Ancef [Cefazolin]   . Ivp Dye [Iodinated Diagnostic Agents]   . Penicillins     Review of Systems: All systems reviewed and negative except  where noted in HPI.   Physical Exam: BP 118/64 mmHg  Pulse 76  Ht 5\' 3"  (1.6 m)  Wt 192 lb 2 oz (87.147 kg)  BMI 34.04 kg/m2 Constitutional: Pleasant,well-developed, white female in no acute distress. HEENT: Normocephalic and atraumatic. Conjunctivae are normal. No scleral icterus. Neck supple.  Cardiovascular: Normal rate, regular rhythm.  Pulmonary/chest: Effort normal and breath sounds normal. No wheezing, rales or rhonchi. Abdominal: Soft, nondistended, nontender. Large old surgical scar. Bowel sounds active throughout. There are no masses palpable. No hepatomegaly. Extremities: no edema Lymphadenopathy: No cervical adenopathy noted. Neurological: Alert and oriented to person place and time. Skin: Skin is warm and dry. No rashes noted. Psychiatric: Normal mood and affect. Behavior is normal.   ASSESSMENT AND PLAN:  62. 64 year old female with longstanding GERD. She is s/p fundoplication approximately 20 years ago. Did well for 10 years after then needed PPIs again. She had a hiatal hernia repair in 2007 ( at time of unrelated abdominal surgery) but has still required high dose PPIs. Patient has a complex surgical history, see HPI.   Will obtain UGI with tablet to assess anatomy, presence of a hiatal hernia and status of wrap. After attempting to discontinue PPI patient had recurrent GERD symptoms. Feeling better again on daily PPI. I will call her with test results.   2. Colon cancer screening. Never had a colonoscopy, doesn't want one given her complicated surgical history and history of cardiopulmonary arrest.  Recent cologuard was negative. No Glendo of colon cancer.   3. Tremors, to see Neurology soon  Cc: Unk Pinto, MD  Addendum: Reviewed and agree with initial management. Jerene Bears, MD

## 2014-10-10 NOTE — Progress Notes (Signed)
Patient ID: [name], female   DOB: 12-01-1950, 64 y.o.   MRN: [id number]  Assessment and Plan:  Hypertension:  -Continue medication,  -monitor blood pressure at home.  -Continue DASH diet.   -Reminder to go to the ER if any CP, SOB, nausea, dizziness, severe HA, changes vision/speech, left arm numbness and tingling, and jaw pain.  Cholesterol: -Continue diet and exercise.  -Check cholesterol.   Pre-diabetes: -Continue diet and exercise.  -Check A1C  Vitamin D Def: -check level -continue medications.   Suprapubic abdominal pain -exam benign with no peritoneal signs or tenderness to palpation -UA -Urine Culture -hold on abx until UA returns -patient is following up with GI after this  Continue diet and meds as discussed. Further disposition pending results of labs.  HPI 64 y.o. female  presents for 3 month follow up with hypertension, hyperlipidemia, prediabetes and vitamin D.   Her blood pressure has been controlled at home, today their BP is BP: 108/60 mmHg.   She does not workout. She denies chest pain, shortness of breath, dizziness.  She reports that her stomach issues with reflux are getting a lot worse.     She is on cholesterol medication and denies myalgias. Her cholesterol is at goal. The cholesterol last visit was:   Lab Results  Component Value Date   CHOL 165 06/20/2014   HDL 76 06/20/2014   LDLCALC 73 06/20/2014   TRIG 79 06/20/2014   CHOLHDL 2.2 06/20/2014     She has been working on diet and exercise for prediabetes, and denies foot ulcerations, hyperglycemia, hypoglycemia , increased appetite, nausea, paresthesia of the feet, polydipsia, polyuria, visual disturbances, vomiting and weight loss. Last A1C in the office was:  Lab Results  Component Value Date   HGBA1C 5.8* 06/20/2014    Patient is on Vitamin D supplement.  Lab Results  Component Value Date   VD25OH 64 06/20/2014     Patient reports that she has been having intermittent lower  abdominal pain which has been going on suprapubically.  She reports that she does have a couple year remote history of ovarian cysts and also has a history of a hernia repair with mesh.  She reports that it is sore when she pushes on it.  She does report that she has been taking ibuprofen and felt that that was really helpful with her pain.  She reports that the pain was severe.  She reports that this has been going on a week.  She reports that it ended on Sunday and it has not gotten worse since.    Current Medications:  Current Outpatient Prescriptions on File Prior to Visit  Medication Sig Dispense Refill  . ALPRAZolam (XANAX) 1 MG tablet TAKE 1 TABLET THREE TIMES DAILY AS NEEDED 270 tablet 1  . aspirin 81 MG tablet Take 81 mg by mouth daily.    . Biotin 1 MG CAPS Take 1 capsule by mouth daily.    . Cholecalciferol (VITAMIN D3) 10000 UNITS capsule Take 10,000 Units by mouth daily.    [name] IBUPROFEN 200 MG CAPS Take 200 mg by mouth as needed. PRN    . fexofenadine (ALLEGRA) 180 MG tablet Take 180 mg by mouth daily.    [name] Kitchen FLUoxetine (PROZAC) 40 MG capsule TAKE 1 CAPSULE EVERY DAY FOR MOOD 90 capsule 1  . gabapentin (NEURONTIN) 300 MG capsule Take 1 capsule (300 mg total) by mouth 2 (two) times daily. 60 capsule 1  . levothyroxine (SYNTHROID, LEVOTHROID) 50 MCG  tablet TAKE 1 TABLET DAILY BEFORE BREAKFAST. 90 tablet 1  . NEXIUM 20 MG capsule     . OVER THE COUNTER MEDICATION Takes OTC Nexium 20 mg daily    . potassium chloride SA (K-DUR,KLOR-CON) 20 MEQ tablet Take 20 mEq by mouth daily. 1/2 tablet daily    . pravastatin (PRAVACHOL) 40 MG tablet TAKE 1 TABLET AT BEDTIME FOR CHOLESTEROL 90 tablet 1  . primidone (MYSOLINE) 250 MG tablet Take 1 tablet (250 mg total) by mouth 2 (two) times daily. 180 tablet 1  . PROBIOTIC CAPS Take 2 capsules by mouth daily.     . ranitidine (ZANTAC) 300 MG tablet Take 1 to 2 tablets daily to facilitate tapering off PPI's for heartburn/acid indigestion 90 tablet 99  .  vitamin B-12 (CYANOCOBALAMIN) 100 MCG tablet Take 50 mcg by mouth daily.     No current facility-administered medications on file prior to visit.    Medical History:  Past Medical History  Diagnosis Date  . Tremor   . Cerebrovascular disease   . Depression with anxiety   . Diabetes   . Dyslipidemia   . Hypertension   . Obesity   . Hypothyroidism   . GERD (gastroesophageal reflux disease)   . Hyperlipidemia   . Pre-diabetes   . Anemia   . Vitamin D deficiency   . Obesity     Allergies:  Allergies  Allergen Reactions  . Enalapril Cough  . Quinapril   . Trazodone And Nefazodone   . Ancef [Cefazolin]   . Ivp Dye [Iodinated Diagnostic Agents]   . Penicillins      Review of Systems:  Review of Systems  Constitutional: Positive for malaise/fatigue and diaphoresis. Negative for fever and chills.  HENT: Negative for congestion, ear pain and sore throat.   Eyes: Negative.   Respiratory: Negative for cough, shortness of breath and wheezing.   Cardiovascular: Negative for chest pain, palpitations and leg swelling.  Gastrointestinal: Positive for heartburn and nausea. Negative for vomiting, diarrhea, constipation, blood in stool and melena.  Genitourinary: Negative for dysuria, urgency, frequency and hematuria.  Skin: Negative.   Neurological: Positive for headaches. Negative for dizziness, sensory change and loss of consciousness.  Psychiatric/Behavioral: Negative for depression. The patient is not nervous/anxious and does not have insomnia.     Family history- Review and unchanged  Social history- Review and unchanged  Physical Exam: BP 108/60 mmHg  Pulse 56  Temp(Src) 98.2 F (36.8 C) (Temporal)  Resp 18  Ht 5' 2.75" (1.594 m)  Wt 193 lb (87.544 kg)  BMI 34.45 kg/m2 Wt Readings from Last 3 Encounters:  10/10/14 193 lb (87.544 kg)  06/20/14 191 lb 9.6 oz (86.909 kg)  06/13/14 193 lb 9.6 oz (87.816 kg)    General Appearance: Well nourished well developed, in no  apparent distress. Eyes: PERRLA, EOMs, conjunctiva no swelling or erythema ENT/Mouth: Ear canals normal without obstruction, swelling, erythma, discharge.  TMs normal bilaterally.  Oropharynx moist, clear, without exudate, or postoropharyngeal swelling. Neck: Supple, thyroid normal,no cervical adenopathy  Respiratory: Respiratory effort normal, Breath sounds clear A&P without rhonchi, wheeze, or rale.  No retractions, no accessory usage. Cardio: RRR with no MRGs. Brisk peripheral pulses without edema.  Abdomen: Soft, + BS,  Non tender, no guarding, rebound, hernias, masses.  NO peritoneal signs Musculoskeletal: Full ROM, 5/5 strength, Normal gait Skin: Warm, dry without rashes, lesions, ecchymosis.  Neuro: Awake and oriented X 3, Cranial nerves intact. Normal muscle tone, no cerebellar symptoms. Psych: Normal affect, Insight  and Judgment appropriate.    Starlyn Skeans, PA-C 9:08 AM [facility]

## 2014-10-11 ENCOUNTER — Ambulatory Visit (INDEPENDENT_AMBULATORY_CARE_PROVIDER_SITE_OTHER): Payer: Commercial Managed Care - HMO | Admitting: Neurology

## 2014-10-11 ENCOUNTER — Encounter: Payer: Self-pay | Admitting: Neurology

## 2014-10-11 VITALS — BP 125/70 | HR 58 | Ht 63.0 in | Wt 193.4 lb

## 2014-10-11 DIAGNOSIS — G250 Essential tremor: Secondary | ICD-10-CM

## 2014-10-11 DIAGNOSIS — R251 Tremor, unspecified: Secondary | ICD-10-CM

## 2014-10-11 DIAGNOSIS — G252 Other specified forms of tremor: Principal | ICD-10-CM

## 2014-10-11 LAB — URINALYSIS, ROUTINE W REFLEX MICROSCOPIC
Bilirubin Urine: NEGATIVE
Glucose, UA: NEGATIVE
Hgb urine dipstick: NEGATIVE
Ketones, ur: NEGATIVE
Leukocytes, UA: NEGATIVE
Nitrite: NEGATIVE
Protein, ur: NEGATIVE
Specific Gravity, Urine: 1.015 (ref 1.001–1.035)
pH: 5.5 (ref 5.0–8.0)

## 2014-10-11 LAB — INSULIN, RANDOM: INSULIN: 4.2 u[IU]/mL (ref 2.0–19.6)

## 2014-10-11 LAB — VITAMIN D 25 HYDROXY (VIT D DEFICIENCY, FRACTURES): Vit D, 25-Hydroxy: 68 ng/mL (ref 30–100)

## 2014-10-11 MED ORDER — GABAPENTIN 100 MG PO CAPS: 100.0000 mg | ORAL_CAPSULE | Freq: Three times a day (TID) | ORAL | Status: DC

## 2014-10-11 NOTE — Progress Notes (Signed)
Reason for visit: Tremor  RAMSHA LONIGRO is an 64 y.o. female  History of present illness:  Ms. Miltner is a 64 year old right-handed white female with a history of an essential tremor. The patient has tremor that affects the right arm more so than the left. She was placed on gabapentin taking 300 mg twice daily, but she could not tolerate the medication during the day, she is only taking gabapentin at nighttime before going to bed. She believes there has been some minimal benefit with the tremor. The patient recently has had some problems with gastroesophageal reflux disease off of Nexium, she noted that when she went back on the Nexium, her tremor significantly worsened. The patient has had some mild gait instability on gabapentin. She has not had any overt falls. She returns to this office for an evaluation. She has been under some stress as her son has had a stroke event recently at age 65.  Past Medical History  Diagnosis Date  . Tremor   . Cerebrovascular disease   . Depression with anxiety   . Diabetes   . Dyslipidemia   . Hypertension   . Obesity   . Hypothyroidism   . GERD (gastroesophageal reflux disease)   . Hyperlipidemia   . Pre-diabetes   . Anemia   . Vitamin D deficiency   . Obesity     Past Surgical History  Procedure Laterality Date  . Breast reduction surgery    . 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 Gallbladder surgery    . Appendectomy    . Abdominal hysterectomy    . Laparoscopic gastric banding    . Abdominal wall mesh  removal    . Abdominal wall mesh  removal      Family History  Problem Relation Age of Onset  . Heart disease Mother   . Heart disease Father   . Diabetes Maternal Aunt   . Stroke Son     Social history:  reports that she has never smoked. She has never used smokeless tobacco. She reports that she does not drink alcohol or use illicit drugs.    Allergies  Allergen Reactions  . Enalapril Cough  . Quinapril   .  Trazodone And Nefazodone   . Ancef [Cefazolin]   . Ivp Dye [Iodinated Diagnostic Agents]   . Penicillins     Medications:  Prior to Admission medications   Medication Sig Start Date End Date Taking? Authorizing Provider  ALPRAZolam Duanne Moron) 1 MG tablet TAKE 1 TABLET THREE TIMES DAILY AS NEEDED 03/23/14  Yes Vicie Mutters, PA-C  aspirin 81 MG tablet Take 81 mg by mouth daily.   Yes Historical Provider, MD  Biotin 1 MG CAPS Take 1 capsule by mouth daily.   Yes Historical Provider, MD  Cholecalciferol (VITAMIN D3) 10000 UNITS capsule Take 10,000 Units by mouth daily.   Yes Historical Provider, MD  EQ IBUPROFEN 200 MG CAPS Take 200 mg by mouth as needed. PRN 01/31/13  Yes Historical Provider, MD  fexofenadine (ALLEGRA) 180 MG tablet Take 180 mg by mouth daily.   Yes Historical Provider, MD  FLUoxetine (PROZAC) 20 MG capsule Take 20 mg by mouth daily.   Yes Historical Provider, MD  FLUoxetine (PROZAC) 40 MG capsule TAKE 1 CAPSULE EVERY DAY FOR MOOD 08/14/14  Yes Unk Pinto, MD  gabapentin (NEURONTIN) 300 MG capsule Take 1 capsule (300 mg total) by mouth 2 (two) times daily. 06/13/14  Yes Kathrynn Ducking, MD  levothyroxine (SYNTHROID, LEVOTHROID) 50 MCG tablet TAKE 1 TABLET DAILY BEFORE BREAKFAST. 08/14/14  Yes Vicie Mutters, PA-C  NEXIUM 20 MG capsule  06/12/14  Yes Historical Provider, MD  potassium chloride SA (K-DUR,KLOR-CON) 20 MEQ tablet Take 20 mEq by mouth daily. 1/2 tablet daily   Yes Historical Provider, MD  pravastatin (PRAVACHOL) 40 MG tablet TAKE 1 TABLET AT BEDTIME FOR CHOLESTEROL 05/29/14  Yes Vicie Mutters, PA-C  primidone (MYSOLINE) 250 MG tablet Take 1 tablet (250 mg total) by mouth 2 (two) times daily. 07/17/14  Yes Kathrynn Ducking, MD  PROBIOTIC CAPS Take 2 capsules by mouth daily.    Yes Historical Provider, MD  ranitidine (ZANTAC) 300 MG tablet Take 1 to 2 tablets daily to facilitate tapering off PPI's for heartburn/acid indigestion 06/20/14  Yes Unk Pinto, MD  vitamin B-12  (CYANOCOBALAMIN) 100 MCG tablet Take 50 mcg by mouth daily.   Yes Historical Provider, MD    ROS:  Out of a complete 14 system review of symptoms, the patient complains only of the following symptoms, and all other reviewed systems are negative.  Tremor Gait disturbance  Blood pressure 125/70, pulse 58, height 5\' 3"  (1.6 m), weight 193 lb 6.4 oz (87.726 kg).  Physical Exam  General: The patient is alert and cooperative at the time of the examination. The patient is moderately obese.  Skin: No significant peripheral edema is noted.   Neurologic Exam  Mental status: The patient is alert and oriented x 3 at the time of the examination. The patient has apparent normal recent and remote memory, with an apparently normal attention span and concentration ability.   Cranial nerves: Facial symmetry is present. Speech is normal, no aphasia or dysarthria is noted. Extraocular movements are full. Visual fields are full. At times, a head and neck tremor is noted.  Motor: The patient has good strength in all 4 extremities.  Sensory examination: Soft touch sensation is symmetric on the face, arms, and legs.  Coordination: The patient has good finger-nose-finger and heel-to-shin bilaterally. The patient does have an intention tremor with finger-nose-finger, the tremor slightly prominent on the right arm than the left.  Gait and station: The patient has a normal gait. Tandem gait is normal. Romberg is positive, the patient goes backwards. No drift is seen.  Reflexes: Deep tendon reflexes are symmetric.   Assessment/Plan:  1. Benign essential tremor  2. Gait instability  The patient is getting some benefit with gabapentin, but she cannot tolerate the drug in the daytime, we will switch her to the 100 mg capsules taking 1 capsule 3 times daily. If she believes that she can tolerate the medication in a higher dose, she is to contact our office. The patient will follow-up in 6 months. She has  had recent blood work that included a CBC and a liver profile, the patient is on Mysoline. These blood work studies were unremarkable. She will continue her current dose of Mysoline.  Jill Alexanders MD 10/11/2014 8:24 AM  [facility] [address] [place], [place] [address]  Phone [phone] Fax [phone]

## 2014-10-11 NOTE — Patient Instructions (Signed)
   We will change over to 100 mg capsules of gabapentin taking this 3 times a day. Look out for drowsiness or walking instability. You may stop the 300 mg gabapentin at night.   Tremor Tremor is a rhythmic, involuntary muscular contraction characterized by oscillations (to-and-fro movements) of a part of the body. The most common of all involuntary movements, tremor can affect various body parts such as the hands, head, facial structures, vocal cords, trunk, and legs; most tremors, however, occur in the hands. Tremor often accompanies neurological disorders associated with aging. Although the disorder is not life-threatening, it can be responsible for functional disability and social embarrassment. TREATMENT  There are many types of tremor and several ways in which tremor is classified. The most common classification is by behavioral context or position. There are five categories of tremor within this classification: resting, postural, kinetic, task-specific, and psychogenic. Resting or static tremor occurs when the muscle is at rest, for example when the hands are lying on the lap. This type of tremor is often seen in patients with Parkinson's disease. Postural tremor occurs when a patient attempts to maintain posture, such as holding the hands outstretched. Postural tremors include physiological tremor, essential tremor, tremor with basal ganglia disease (also seen in patients with Parkinson's disease), cerebellar postural tremor, tremor with peripheral neuropathy, post-traumatic tremor, and alcoholic tremor. Kinetic or intention (action) tremor occurs during purposeful movement, for example during finger-to-nose testing. Task-specific tremor appears when performing goal-oriented tasks such as handwriting, speaking, or standing. This group consists of primary writing tremor, vocal tremor, and orthostatic tremor. Psychogenic tremor occurs in both older and younger patients. The key feature of this tremor is  that it dramatically lessens or disappears when the patient is distracted. PROGNOSIS There are some treatment options available for tremor; the appropriate treatment depends on accurate diagnosis of the cause. Some tremors respond to treatment of the underlying condition, for example in some cases of psychogenic tremor treating the patient's underlying mental problem may cause the tremor to disappear. Also, patients with tremor due to Parkinson's disease may be treated with Levodopa drug therapy. Symptomatic drug therapy is available for several other tremors as well. For those cases of tremor in which there is no effective drug treatment, physical measures such as teaching the patient to brace the affected limb during the tremor are sometimes useful. Surgical intervention such as thalamotomy or deep brain stimulation may be useful in certain cases. Document Released: 02/14/2002 Document Revised: 05/19/2011 Document Reviewed: 02/24/2005 [facility] Patient Information 2015 [place], [place]. This information is not intended to replace advice given to you by your health care provider. Make sure you discuss any questions you have with your health care provider.

## 2014-10-12 ENCOUNTER — Ambulatory Visit (HOSPITAL_COMMUNITY)
Admission: RE | Admit: 2014-10-12 | Discharge: 2014-10-12 | Disposition: A | Discharge status: Home / Self Care | Payer: Commercial Managed Care - HMO | Source: Ambulatory Visit | Attending: Nurse Practitioner | Admitting: Nurse Practitioner

## 2014-10-12 ENCOUNTER — Encounter: Payer: Self-pay | Admitting: Internal Medicine

## 2014-10-12 DIAGNOSIS — K219 Gastro-esophageal reflux disease without esophagitis: Secondary | ICD-10-CM | POA: Diagnosis not present

## 2014-10-12 DIAGNOSIS — K449 Diaphragmatic hernia without obstruction or gangrene: Secondary | ICD-10-CM | POA: Diagnosis not present

## 2014-10-12 DIAGNOSIS — Z9889 Other specified postprocedural states: Secondary | ICD-10-CM

## 2014-10-12 LAB — URINE CULTURE
Colony Count: NO GROWTH
Organism ID, Bacteria: NO GROWTH

## 2014-10-26 ENCOUNTER — Telehealth: Payer: Self-pay | Admitting: *Deleted

## 2014-10-26 NOTE — Telephone Encounter (Signed)
Faxed Release of Information, signed by patient on 10-10-2014.  Looking for EGD with pathology, done by Dr. Wilford Corner at [facility].  Patient thought it was about 2014, not sure.  We have an EGD report in [facility] dated 03/31/2013. Done by Dr. Michail Sermon EGD with pathology filed under labs.  Spoke to [employer] at [place] today, 10-26-2014.  She said this patient only had one procedure and this was the EGD dated 03-31-2013.

## 2014-11-07 ENCOUNTER — Encounter: Payer: Self-pay | Admitting: Internal Medicine

## 2014-11-09 ENCOUNTER — Encounter: Payer: Self-pay | Admitting: *Deleted

## 2014-11-10 ENCOUNTER — Encounter: Payer: Self-pay | Admitting: *Deleted

## 2014-11-20 DIAGNOSIS — F4323 Adjustment disorder with mixed anxiety and depressed mood: Secondary | ICD-10-CM | POA: Diagnosis not present

## 2014-11-26 ENCOUNTER — Other Ambulatory Visit: Payer: Self-pay | Admitting: Internal Medicine

## 2014-11-26 ENCOUNTER — Encounter: Payer: Self-pay | Admitting: Internal Medicine

## 2014-11-26 DIAGNOSIS — I10 Essential (primary) hypertension: Secondary | ICD-10-CM

## 2014-11-26 DIAGNOSIS — I872 Venous insufficiency (chronic) (peripheral): Secondary | ICD-10-CM

## 2014-11-26 MED ORDER — ALPRAZOLAM 1 MG PO TABS: 1.0000 mg | ORAL_TABLET | Freq: Three times a day (TID) | ORAL | Status: DC | PRN

## 2014-11-26 MED ORDER — PRAVASTATIN SODIUM 40 MG PO TABS: ORAL_TABLET | ORAL | Status: DC

## 2014-11-26 MED ORDER — BUMETANIDE 2 MG PO TABS: ORAL_TABLET | ORAL | Status: AC

## 2014-12-05 DIAGNOSIS — H5203 Hypermetropia, bilateral: Secondary | ICD-10-CM | POA: Diagnosis not present

## 2014-12-05 DIAGNOSIS — H5210 Myopia, unspecified eye: Secondary | ICD-10-CM | POA: Diagnosis not present

## 2014-12-18 ENCOUNTER — Ambulatory Visit: Payer: Self-pay | Admitting: Internal Medicine

## 2014-12-21 ENCOUNTER — Encounter: Payer: Self-pay | Admitting: Internal Medicine

## 2014-12-22 MED ORDER — POTASSIUM CHLORIDE CRYS ER 20 MEQ PO TBCR: 20.0000 meq | EXTENDED_RELEASE_TABLET | Freq: Every day | ORAL | Status: DC

## 2015-01-10 DIAGNOSIS — L821 Other seborrheic keratosis: Secondary | ICD-10-CM | POA: Diagnosis not present

## 2015-01-10 DIAGNOSIS — D2362 Other benign neoplasm of skin of left upper limb, including shoulder: Secondary | ICD-10-CM | POA: Diagnosis not present

## 2015-01-10 DIAGNOSIS — D2261 Melanocytic nevi of right upper limb, including shoulder: Secondary | ICD-10-CM | POA: Diagnosis not present

## 2015-01-10 DIAGNOSIS — D692 Other nonthrombocytopenic purpura: Secondary | ICD-10-CM | POA: Diagnosis not present

## 2015-01-16 ENCOUNTER — Ambulatory Visit (INDEPENDENT_AMBULATORY_CARE_PROVIDER_SITE_OTHER): Payer: Commercial Managed Care - HMO | Admitting: Internal Medicine

## 2015-01-16 ENCOUNTER — Encounter: Payer: Self-pay | Admitting: Internal Medicine

## 2015-01-16 VITALS — BP 144/78 | HR 54 | Temp 98.2°F | Resp 18 | Ht 62.75 in | Wt 198.0 lb

## 2015-01-16 DIAGNOSIS — K219 Gastro-esophageal reflux disease without esophagitis: Secondary | ICD-10-CM | POA: Diagnosis not present

## 2015-01-16 DIAGNOSIS — Z79899 Other long term (current) drug therapy: Secondary | ICD-10-CM | POA: Diagnosis not present

## 2015-01-16 DIAGNOSIS — E782 Mixed hyperlipidemia: Secondary | ICD-10-CM | POA: Diagnosis not present

## 2015-01-16 DIAGNOSIS — R7309 Other abnormal glucose: Secondary | ICD-10-CM | POA: Diagnosis not present

## 2015-01-16 DIAGNOSIS — E039 Hypothyroidism, unspecified: Secondary | ICD-10-CM

## 2015-01-16 DIAGNOSIS — R7303 Prediabetes: Secondary | ICD-10-CM | POA: Diagnosis not present

## 2015-01-16 DIAGNOSIS — I10 Essential (primary) hypertension: Secondary | ICD-10-CM

## 2015-01-16 DIAGNOSIS — Z6835 Body mass index (BMI) 35.0-35.9, adult: Secondary | ICD-10-CM

## 2015-01-16 DIAGNOSIS — E559 Vitamin D deficiency, unspecified: Secondary | ICD-10-CM | POA: Diagnosis not present

## 2015-01-16 DIAGNOSIS — Z23 Encounter for immunization: Secondary | ICD-10-CM

## 2015-01-16 LAB — CBC WITH DIFFERENTIAL/PLATELET
BASOS ABS: 0 10*3/uL (ref 0.0–0.1)
Basophils Relative: 0 % (ref 0–1)
EOS PCT: 3 % (ref 0–5)
Eosinophils Absolute: 0.1 10*3/uL (ref 0.0–0.7)
HEMATOCRIT: 45 % (ref 36.0–46.0)
Hemoglobin: 14.8 g/dL (ref 12.0–15.0)
LYMPHS PCT: 38 % (ref 12–46)
Lymphs Abs: 1.7 10*3/uL (ref 0.7–4.0)
MCH: 29.9 pg (ref 26.0–34.0)
MCHC: 32.9 g/dL (ref 30.0–36.0)
MCV: 90.9 fL (ref 78.0–100.0)
MPV: 10.6 fL (ref 8.6–12.4)
Monocytes Absolute: 0.4 10*3/uL (ref 0.1–1.0)
Monocytes Relative: 8 % (ref 3–12)
NEUTROS ABS: 2.3 10*3/uL (ref 1.7–7.7)
Neutrophils Relative %: 51 % (ref 43–77)
PLATELETS: 167 10*3/uL (ref 150–400)
RBC: 4.95 MIL/uL (ref 3.87–5.11)
RDW: 13.5 % (ref 11.5–15.5)
WBC: 4.5 10*3/uL (ref 4.0–10.5)

## 2015-01-16 LAB — BASIC METABOLIC PANEL WITH GFR
BUN: 13 mg/dL (ref 7–25)
CALCIUM: 9.6 mg/dL (ref 8.6–10.4)
CO2: 30 mmol/L (ref 20–31)
Chloride: 101 mmol/L (ref 98–110)
Creat: 0.88 mg/dL (ref 0.50–0.99)
GFR, EST AFRICAN AMERICAN: 80 mL/min (ref >=60)
GFR, Est Non African American: 69 mL/min (ref >=60)
GLUCOSE: 88 mg/dL (ref 65–99)
POTASSIUM: 4.4 mmol/L (ref 3.5–5.3)
Sodium: 142 mmol/L (ref 135–146)

## 2015-01-16 LAB — HEPATIC FUNCTION PANEL
ALK PHOS: 121 U/L (ref 33–130)
ALT: 13 U/L (ref 6–29)
AST: 17 U/L (ref 10–35)
Albumin: 4.2 g/dL (ref 3.6–5.1)
BILIRUBIN DIRECT: 0.1 mg/dL (ref <=0.2)
BILIRUBIN INDIRECT: 0.3 mg/dL (ref 0.2–1.2)
Total Bilirubin: 0.4 mg/dL (ref 0.2–1.2)
Total Protein: 6.6 g/dL (ref 6.1–8.1)

## 2015-01-16 LAB — LIPID PANEL
CHOL/HDL RATIO: 2.2 ratio (ref <=5.0)
CHOLESTEROL: 163 mg/dL (ref 125–200)
HDL: 73 mg/dL (ref >=46)
LDL CALC: 72 mg/dL (ref <130)
TRIGLYCERIDES: 91 mg/dL (ref <150)
VLDL: 18 mg/dL (ref <30)

## 2015-01-16 LAB — MAGNESIUM: Magnesium: 2 mg/dL (ref 1.5–2.5)

## 2015-01-16 LAB — HEMOGLOBIN A1C
Hgb A1c MFr Bld: 5.5 % (ref <5.7)
Mean Plasma Glucose: 111 mg/dL (ref <117)

## 2015-01-16 LAB — TSH: TSH: 1.002 u[IU]/mL (ref 0.350–4.500)

## 2015-01-16 NOTE — Progress Notes (Addendum)
Patient ID: [name], female   DOB: 1950-11-16, 64 y.o.   MRN: [id number]   This very nice 64 y.o. MWF presents for 6 month follow up with Hypertension, Hyperlipidemia, Pre-Diabetes, Hypothyroidism  and Vitamin D Deficiency. Patient has seen Dr Jannifer Franklin recently for her tremor. She also relates anxiety relating to [place] [facility] Patient also reports her GERD is not controlled with her Nexium alone and continues to supplement with Ranitidine.    Patient is treated for HTN & BP has been controlled at home. Today's  BP is sl elevated at 144/78. Patient has had no complaints of any cardiac type chest pain, palpitations, dyspnea/orthopnea/PND, dizziness, claudication, or dependent edema. Patient does not exercise. Patient has CKD 3 with GFR 84 ml/min.    Hyperlipidemia is controlled with diet & meds. Patient denies myalgias or other med SE's. Last Lipids were at goal with Cholesterol 161; HDL 59; LDL 83; Triglycerides 93 on 10/10/2014.   Also, the patient has history of Morbid Obesity (BMI 35+) and consequent PreDiabetes since 2004 and she has had no symptoms of reactive hypoglycemia, diabetic polys, paresthesias or visual blurring.  Last A1c was  5.8% on 10/10/2014.   Patient has been on thyroid replacement since 2010and maintained euthyroid. Further, the patient also has history of Vitamin D Deficiency of 24 in 2008 and supplements vitamin D without any suspected side-effects. Last vitamin D was 68 on 10/10/2014.  Medication Sig  . ALPRAZolam (XANAX) 1 MG tablet Take 1 tablet (1 mg total) by mouth 3 (three) times daily as needed.  [name] Kitchen aspirin 81 MG tablet Take 81 mg by mouth daily.  . bumetanide (BUMEX) 2 MG tablet Take 1/2 to 1 tablet daily for BP and fluid retention  . VITAMIN D 41324 UNITS capsule Take 10,000 Units by mouth daily.  [name] IBUPROFEN 200 MG CAPS Take 200 mg by mouth as needed. PRN  . FLUoxetine (PROZAC) 20 MG capsule Take 20 mg by mouth daily.   [name] Kitchen FLUoxetine (PROZAC) 40 MG capsule TAKE 1 CAPSULE EVERY DAY FOR MOOD  . levothyroxine  50 MCG tablet TAKE 1 TABLET DAILY BEFORE BREAKFAST.  [name] Kitchen NEXIUM 20 MG capsule   . potassium chloride SA  20 MEQ tablet Take 1 tablet (20 mEq total) by mouth daily. 1/2 tablet daily  . pravastatin (PRAVACHOL) 40 MG tablet TAKE 1 TABLET AT BEDTIME FOR CHOLESTEROL  . primidone (MYSOLINE) 250 MG tablet Take 1 tablet (250 mg total) by mouth 2 (two) times daily.  [name] Kitchen PROBIOTIC CAPS Take 2 capsules by mouth daily.   . ranitidine (ZANTAC) 300 MG tablet Take 1 to 2 tablets daily to facilitate tapering off PPI's for heartburn/acid indigestion  . vitamin B-12  100 MCG tablet Take 50 mcg by mouth daily.  . Biotin 1 MG CAPS Take 1 capsule by mouth daily.  . fexofenadine (ALLEGRA) 180 MG tablet Take 180 mg by mouth daily.  [name] Kitchen gabapentin (NEURONTIN) 100 MG  Take 1 capsule (100 mg total) by mouth 3 (three) times daily.   Allergies  Allergen Reactions  . Enalapril Cough  . Quinapril   . Trazodone And Nefazodone   . Ancef [Cefazolin]   . Ivp Dye [Iodinated Diagnostic Agents]   . Penicillins    PMHx:   Past Medical History  Diagnosis Date  . Tremor   . Cerebrovascular disease   . Depression with anxiety   . Diabetes ([place])   . Dyslipidemia   . Hypertension   .  Obesity   . Hypothyroidism   . GERD (gastroesophageal reflux disease)   . Hyperlipidemia   . Pre-diabetes   . Anemia   . Vitamin D deficiency   . Obesity   . Ventral hernia   . Hepatomegaly   . Diverticulosis   . Hiatal hernia   . Fundic gland polyps of stomach, benign    Immunization History  Administered Date(s) Administered  . DTaP 06/09/2011  . Influenza Whole 12/05/2010, 11/09/2011  . Influenza, High Dose Seasonal PF 01/02/2014, 01/16/2015  . Zoster 06/09/2012   Past Surgical History  Procedure Laterality Date  . Breast reduction surgery    . 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 Gallbladder surgery    . Appendectomy    .  Abdominal hysterectomy    . Laparoscopic gastric banding    . Abdominal wall mesh  removal    . Abdominal wall mesh  removal     FHx:    Reviewed / unchanged  SHx:    Reviewed / unchanged  Systems Review:  Constitutional: Denies fever, chills, wt changes, headaches, insomnia, fatigue, night sweats, change in appetite. Eyes: Denies redness, blurred vision, diplopia, discharge, itchy, watery eyes.  ENT: Denies discharge, congestion, post nasal drip, epistaxis, sore throat, earache, hearing loss, dental pain, tinnitus, vertigo, sinus pain, snoring.  CV: Denies chest pain, palpitations, irregular heartbeat, syncope, dyspnea, diaphoresis, orthopnea, PND, claudication or edema. Respiratory: denies cough, dyspnea, DOE, pleurisy, hoarseness, laryngitis, wheezing.  Gastrointestinal: Denies dysphagia, odynophagia, heartburn, reflux, water brash, abdominal pain or cramps, nausea, vomiting, bloating, diarrhea, constipation, hematemesis, melena, hematochezia  or hemorrhoids. Genitourinary: Denies dysuria, frequency, urgency, nocturia, hesitancy, discharge, hematuria or flank pain. Musculoskeletal: Denies arthralgias, myalgias, stiffness, jt. swelling, pain, limping or strain/sprain.  Skin: Denies pruritus, rash, hives, warts, acne, eczema or change in skin lesion(s). Neuro: No weakness, tremor, incoordination, spasms, paresthesia or pain. Psychiatric: Denies confusion, memory loss or sensory loss. Endo: Denies change in weight, skin or hair change.  Heme/Lymph: No excessive bleeding, bruising or enlarged lymph nodes.  Physical Exam  BP 144/78 mmHg  Pulse 54  Temp(Src) 98.2 F (36.8 C) (Temporal)  Resp 18  Ht 5' 2.75" (1.594 m)  Wt 198 lb (89.812 kg)  BMI 35.35 kg/m2  Appears well nourished and in no distress. Eyes: PERRLA, EOMs, conjunctiva no swelling or erythema. Sinuses: No frontal/maxillary tenderness ENT/Mouth: EAC's clear, TM's nl w/o erythema, bulging. Nares clear w/o erythema,  swelling, exudates. Oropharynx clear without erythema or exudates. Oral hygiene is good. Tongue normal, non obstructing. Hearing intact.  Neck: Supple. Thyroid nl. Car 2+/2+ without bruits, nodes or JVD. Chest: Respirations nl with BS clear & equal w/o rales, rhonchi, wheezing or stridor.  Cor: Heart sounds normal w/ regular rate and rhythm without sig. murmurs, gallops, clicks, or rubs. Peripheral pulses normal and equal  without edema.  Abdomen: Soft & bowel sounds normal. Non-tender w/o guarding, rebound, hernias, masses, or organomegaly.  Lymphatics: Unremarkable.  Musculoskeletal: Full ROM all peripheral extremities, joint stability, 5/5 strength, and normal gait.  Skin: Warm, dry without exposed rashes, lesions or ecchymosis apparent.  Neuro: Cranial nerves intact, reflexes equal bilaterally. Sensory-motor testing grossly intact. Tendon reflexes grossly intact. Slight action tremor - high frequency/low amplitude.  Pysch: Alert & oriented x 3.  Insight and judgement nl & appropriate. No ideations.  Assessment and Plan:  1. Essential hypertension  - TSH  2. Hyperlipidemia  - Lipid panel  3. Prediabetes  - Hemoglobin A1c - Insulin, random  4. Vitamin D deficiency  - Vit D  25 hydroxy   5. Hypothyroidism,  - TSH  6. Gastroesophageal reflux disease   7. Morbid obesity  ([place])   8. Need for prophylactic vaccination and inoculation against influenza  - Flu vaccine HIGH DOSE PF (Fluzone High dose)  9. BMI 35.0-35.9,adult   10. Medication management  - CBC with Differential/Platelet - BASIC METABOLIC PANEL WITH GFR - Hepatic function panel - Magnesium   Recommended regular exercise, BP monitoring, weight control, and discussed med and SE's. Recommended labs to assess and monitor clinical status. Further disposition pending results of labs. Over 30 minutes of exam, counseling, chart review was performed

## 2015-01-16 NOTE — Patient Instructions (Signed)

## 2015-01-17 LAB — VITAMIN D 25 HYDROXY (VIT D DEFICIENCY, FRACTURES): Vit D, 25-Hydroxy: 76 ng/mL (ref 30–100)

## 2015-01-17 LAB — INSULIN, RANDOM: Insulin: 5.5 u[IU]/mL (ref 2.0–19.6)

## 2015-02-02 ENCOUNTER — Other Ambulatory Visit: Payer: Self-pay | Admitting: Internal Medicine

## 2015-02-02 ENCOUNTER — Other Ambulatory Visit: Payer: Self-pay | Admitting: Physician Assistant

## 2015-02-02 ENCOUNTER — Other Ambulatory Visit: Payer: Self-pay | Admitting: Neurology

## 2015-03-29 ENCOUNTER — Other Ambulatory Visit: Payer: Self-pay | Admitting: Internal Medicine

## 2015-04-05 ENCOUNTER — Other Ambulatory Visit: Payer: Self-pay | Admitting: Neurology

## 2015-04-06 ENCOUNTER — Ambulatory Visit: Payer: Self-pay | Admitting: Neurology

## 2015-04-30 ENCOUNTER — Ambulatory Visit: Payer: Self-pay | Admitting: Physician Assistant

## 2015-05-17 ENCOUNTER — Ambulatory Visit: Payer: Self-pay | Admitting: Physician Assistant

## 2015-07-17 ENCOUNTER — Encounter: Payer: Self-pay | Admitting: Internal Medicine

## 2015-08-23 ENCOUNTER — Ambulatory Visit: Payer: Self-pay | Admitting: Internal Medicine

## 2015-09-25 ENCOUNTER — Other Ambulatory Visit: Payer: Self-pay | Admitting: Internal Medicine

## 2015-10-05 ENCOUNTER — Other Ambulatory Visit: Payer: Self-pay | Admitting: Internal Medicine

## 2018-04-22 DIAGNOSIS — F329 Major depressive disorder, single episode, unspecified: Secondary | ICD-10-CM | POA: Diagnosis not present

## 2018-04-22 DIAGNOSIS — R251 Tremor, unspecified: Secondary | ICD-10-CM | POA: Diagnosis not present

## 2018-04-22 DIAGNOSIS — I10 Essential (primary) hypertension: Secondary | ICD-10-CM | POA: Diagnosis not present

## 2018-04-22 DIAGNOSIS — E7800 Pure hypercholesterolemia, unspecified: Secondary | ICD-10-CM | POA: Diagnosis not present

## 2018-04-22 DIAGNOSIS — Z789 Other specified health status: Secondary | ICD-10-CM | POA: Diagnosis not present

## 2018-04-22 DIAGNOSIS — Z299 Encounter for prophylactic measures, unspecified: Secondary | ICD-10-CM | POA: Diagnosis not present

## 2018-04-22 DIAGNOSIS — Z6841 Body Mass Index (BMI) 40.0 and over, adult: Secondary | ICD-10-CM | POA: Diagnosis not present

## 2018-05-19 DIAGNOSIS — E785 Hyperlipidemia, unspecified: Secondary | ICD-10-CM | POA: Diagnosis not present

## 2018-05-19 DIAGNOSIS — Z7189 Other specified counseling: Secondary | ICD-10-CM | POA: Diagnosis not present

## 2018-05-19 DIAGNOSIS — Z1211 Encounter for screening for malignant neoplasm of colon: Secondary | ICD-10-CM | POA: Diagnosis not present

## 2018-05-19 DIAGNOSIS — Z0000 Encounter for general adult medical examination without abnormal findings: Secondary | ICD-10-CM | POA: Diagnosis not present

## 2018-05-19 DIAGNOSIS — E039 Hypothyroidism, unspecified: Secondary | ICD-10-CM | POA: Diagnosis not present

## 2018-05-19 DIAGNOSIS — Z1331 Encounter for screening for depression: Secondary | ICD-10-CM | POA: Diagnosis not present

## 2018-05-19 DIAGNOSIS — Z6841 Body Mass Index (BMI) 40.0 and over, adult: Secondary | ICD-10-CM | POA: Diagnosis not present

## 2018-05-19 DIAGNOSIS — I10 Essential (primary) hypertension: Secondary | ICD-10-CM | POA: Diagnosis not present

## 2018-05-19 DIAGNOSIS — Z79899 Other long term (current) drug therapy: Secondary | ICD-10-CM | POA: Diagnosis not present

## 2018-05-19 DIAGNOSIS — Z1339 Encounter for screening examination for other mental health and behavioral disorders: Secondary | ICD-10-CM | POA: Diagnosis not present

## 2018-05-19 DIAGNOSIS — Z299 Encounter for prophylactic measures, unspecified: Secondary | ICD-10-CM | POA: Diagnosis not present

## 2018-05-19 DIAGNOSIS — R739 Hyperglycemia, unspecified: Secondary | ICD-10-CM | POA: Diagnosis not present

## 2018-05-25 DIAGNOSIS — Z299 Encounter for prophylactic measures, unspecified: Secondary | ICD-10-CM | POA: Diagnosis not present

## 2018-05-25 DIAGNOSIS — I10 Essential (primary) hypertension: Secondary | ICD-10-CM | POA: Diagnosis not present

## 2018-05-25 DIAGNOSIS — R739 Hyperglycemia, unspecified: Secondary | ICD-10-CM | POA: Diagnosis not present

## 2018-05-25 DIAGNOSIS — Z6841 Body Mass Index (BMI) 40.0 and over, adult: Secondary | ICD-10-CM | POA: Diagnosis not present

## 2018-05-25 DIAGNOSIS — E785 Hyperlipidemia, unspecified: Secondary | ICD-10-CM | POA: Diagnosis not present

## 2018-05-25 DIAGNOSIS — E039 Hypothyroidism, unspecified: Secondary | ICD-10-CM | POA: Diagnosis not present

## 2018-06-16 DIAGNOSIS — E039 Hypothyroidism, unspecified: Secondary | ICD-10-CM | POA: Diagnosis not present

## 2018-06-16 DIAGNOSIS — I10 Essential (primary) hypertension: Secondary | ICD-10-CM | POA: Diagnosis not present

## 2018-06-16 DIAGNOSIS — Z713 Dietary counseling and surveillance: Secondary | ICD-10-CM | POA: Diagnosis not present

## 2018-06-16 DIAGNOSIS — L0202 Furuncle of face: Secondary | ICD-10-CM | POA: Diagnosis not present

## 2018-06-16 DIAGNOSIS — Z299 Encounter for prophylactic measures, unspecified: Secondary | ICD-10-CM | POA: Diagnosis not present

## 2018-08-19 DIAGNOSIS — Z299 Encounter for prophylactic measures, unspecified: Secondary | ICD-10-CM | POA: Diagnosis not present

## 2018-08-19 DIAGNOSIS — Z6841 Body Mass Index (BMI) 40.0 and over, adult: Secondary | ICD-10-CM | POA: Diagnosis not present

## 2018-08-19 DIAGNOSIS — K219 Gastro-esophageal reflux disease without esophagitis: Secondary | ICD-10-CM | POA: Diagnosis not present

## 2018-08-19 DIAGNOSIS — Z713 Dietary counseling and surveillance: Secondary | ICD-10-CM | POA: Diagnosis not present

## 2018-08-19 DIAGNOSIS — I10 Essential (primary) hypertension: Secondary | ICD-10-CM | POA: Diagnosis not present

## 2018-09-08 ENCOUNTER — Encounter: Payer: Self-pay | Admitting: Gastroenterology

## 2018-09-22 ENCOUNTER — Telehealth: Payer: Self-pay

## 2018-09-22 NOTE — Telephone Encounter (Signed)
Covid-19 screening questions   Do you now or have you had a fever in the last 14 days? No  Do you have any respiratory symptoms of shortness of breath or cough now or in the last 14 days?  Said she has allergies, has had productive cough. For several months per patient and thinks it is due to reflux with the phlegm and acid  Do you have any family members or close contacts with diagnosed or suspected Covid-19 in the past 14 days? No  Have you been tested for Covid-19 and found to be positive? No

## 2018-09-23 ENCOUNTER — Ambulatory Visit (INDEPENDENT_AMBULATORY_CARE_PROVIDER_SITE_OTHER): Payer: Medicare HMO | Admitting: Gastroenterology

## 2018-09-23 ENCOUNTER — Encounter: Payer: Self-pay | Admitting: Gastroenterology

## 2018-09-23 VITALS — BP 134/92 | HR 81 | Temp 98.7°F | Ht 63.0 in | Wt 230.0 lb

## 2018-09-23 DIAGNOSIS — Z1211 Encounter for screening for malignant neoplasm of colon: Secondary | ICD-10-CM

## 2018-09-23 DIAGNOSIS — K219 Gastro-esophageal reflux disease without esophagitis: Secondary | ICD-10-CM

## 2018-09-23 NOTE — Patient Instructions (Signed)
You have been scheduled for an endoscopy. Please follow written instructions given to you at your visit today. If you use inhalers (even only as needed), please bring them with you on the day of your procedure. Your physician has requested that you go to www.[email or web address] and enter the access code given to you at your visit today. This web site gives a general overview about your procedure. However, you should still follow specific instructions given to you by our office regarding your preparation for the procedure.  Your provider has ordered Cologuard testing as an option for colon cancer screening. This is performed by [employer] and may be out of network with your insurance. PRIOR to completing the test, it is YOUR responsibility to contact your insurance about covered benefits for this test. Your out of pocket expense could be anywhere from $0.00 to $649.00.   When you call to check coverage with your insurer, please provide the following information:   -The ONLY provider of Cologuard is [place] code for Cologuard is [phone].  [employer] NPI # [id number]  -[employer] Tax ID # [id number]   We have already sent your demographic and insurance information to [employer] (phone number [phone]) and they should contact you within the next week regarding your test. If you have not heard from them within the next week, please call our office at [phone].   Please consider taking Miralax daily

## 2018-09-23 NOTE — Progress Notes (Addendum)
09/23/2018 [name] [id number] 1950/11/08   HISTORY OF PRESENT ILLNESS:  This is a 68 year old female who is a patient of Dr. Vena Rua.  She has a very complex surgical history. Patient is status post [employer] fundoplication approximately 25 years ago. In 2007 patient went for gastric band placement but during surgery was found to have a giant upper abdominal midline incisional hernia which couldn't be repaired at time of lap band. Patient subsequently developed a SBO,  underwent laparoscopy with repair of multiple ventral abdominal hernias and a hiatal hernia. Post-op course was complicated by an infected hematoma requiring laparotomy and a wound vac.  At some point patient developed cardiopulmonary arrest. She required several additional wound surgeries but these were done in [place].   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   Patient is here again for evaluation of GERD.  She is still having significant reflux symptoms despite Nexium 40 mg daily.  She says that she gets a lot of acid burning up into her mouth.  She feels like it is blistering her lips.  She sleeps sitting up sometimes and still wakes up choking on acid.  Gets nauseous after most meals.  She says "I want to rule out cancer".  Knows that she has adhesions from all of her previous surgeries.  If she is constipated and has hard stools then she will experience some pain on the left side as she can feel the stool moving through that part of her colon.    Past Medical History:  Diagnosis Date  . Anemia   . Cerebrovascular disease   . Depression with anxiety   . Diabetes ([place])   . Diverticulosis   . Dyslipidemia   . Fundic gland polyps of stomach, benign   . GERD (gastroesophageal reflux disease)   . Hepatomegaly   . Hiatal hernia   . Hyperlipidemia   . Hypertension   . Hypothyroidism   . Obesity   . Obesity   . Osteoarthritis    . Pre-diabetes   . Tremor   . Urinary incontinence   . Ventral hernia   . Vitamin D deficiency    Past Surgical History:  Procedure Laterality Date  . ABDOMINAL HYSTERECTOMY    . ABDOMINAL WALL MESH  REMOVAL    . ABDOMINAL WALL MESH  REMOVAL    . APPENDECTOMY    . BREAST REDUCTION SURGERY    . COLONOSCOPY     At a hospital. Can't remember  . ESOPHAGOGASTRODUODENOSCOPY     serveral times. been years  . GALLBLADDER SURGERY    . LAPAROSCOPIC GASTRIC BANDING    . TRACHEAL STENOSIS REPAIR W/ PERFUSION AND [employer]      reports that she has never smoked. She has never used smokeless tobacco. She reports current alcohol use. She reports that she does not use drugs. family history includes Diabetes in her maternal aunt; Heart disease in her father and mother; Stroke in her son. Allergies  Allergen Reactions  . Enalapril Cough  . Quinapril   . Trazodone And Nefazodone   . Ancef [Cefazolin]   . Ivp Dye [Iodinated Diagnostic Agents]   . Penicillins       Outpatient Encounter Medications as of 09/23/2018  Medication Sig  . aspirin 81 MG tablet Take 81 mg by mouth daily.  . bumetanide (BUMEX) 1 MG tablet 1 tablet daily.  . diphenhydrAMINE HCl (ALLERGY MED PO) Take 1 tablet  by mouth daily as needed.  . DULoxetine (CYMBALTA) 60 MG capsule 1 capsule daily.  [name] Kitchen esomeprazole (NEXIUM) 40 MG capsule Take 40 mg by mouth 2 (two) times daily.  [name] Kitchen levothyroxine (SYNTHROID, LEVOTHROID) 50 MCG tablet TAKE 1 TABLET DAILY BEFORE BREAKFAST.  [name] Kitchen Methylsulfonylmethane (MSM) 1500 MG TABS Take 1 tablet by mouth 2 (two) times daily.  . modafinil (PROVIGIL) 200 MG tablet 1 tablet daily.  . pravastatin (PRAVACHOL) 40 MG tablet TAKE 1 TABLET AT BEDTIME FOR CHOLESTEROL (Patient taking differently: Take 20 mg by mouth daily. )  . PROBIOTIC CAPS Take 2 capsules by mouth daily.   [name] Kitchen PUMPKIN SEED PO Take 1 tablet by mouth daily.  . Vitamin D, Cholecalciferol, 50 MCG (2000 UT) CAPS Take 1 capsule by mouth daily.  [name] Kitchen  ALPRAZolam (XANAX) 1 MG tablet Take 1 tablet (1 mg total) by mouth 3 (three) times daily as needed. (Patient not taking: Reported on 09/23/2018)  . diclofenac (VOLTAREN) 75 MG EC tablet 1 tablet as needed.  [name] IBUPROFEN 200 MG CAPS Take 200 mg by mouth as needed. PRN  . [DISCONTINUED] Cholecalciferol (VITAMIN D3) 10000 UNITS capsule Take 10,000 Units by mouth daily.  . [DISCONTINUED] FLUoxetine (PROZAC) 40 MG capsule TAKE 1 CAPSULE EVERY DAY FOR MOOD  . [DISCONTINUED] gabapentin (NEURONTIN) 100 MG capsule   . [DISCONTINUED] NEXIUM 20 MG capsule   . [DISCONTINUED] potassium chloride SA (K-DUR,KLOR-CON) 20 MEQ tablet TAKE 1 TABLET EVERY DAY  . [DISCONTINUED] primidone (MYSOLINE) 250 MG tablet TAKE 1 TABLET TWICE DAILY  . [DISCONTINUED] ranitidine (ZANTAC) 300 MG tablet Take 1 to 2 tablets daily to facilitate tapering off PPI's for heartburn/acid indigestion  . [DISCONTINUED] vitamin B-12 (CYANOCOBALAMIN) 100 MCG tablet Take 50 mcg by mouth daily.   No facility-administered encounter medications on file as of 09/23/2018.      REVIEW OF SYSTEMS  : All other systems reviewed and negative except where noted in the History of Present Illness.   PHYSICAL EXAM: BP (!) 134/92   Pulse 81   Temp 98.7 F (37.1 C)   Ht 5\' 3"  (1.6 m)   Wt 230 lb (104.3 kg)   BMI 40.74 kg/m  General: Well developed white female in no acute distress Head: Normocephalic and atraumatic Eyes:  Sclerae anicteric, conjunctiva pink. Ears: Normal auditory acuity Lungs: Clear throughout to auscultation; no increased WOB. Heart: Regular rate and rhythm; no M/R/G. Abdomen: Soft, non-distended.  BS present.  Multiple scars noted on abdomen. Musculoskeletal: Symmetrical with no gross deformities  Skin: No lesions on visible extremities Extremities: No edema  Neurological: Alert oriented x 4, grossly non-focal Psychological:  Alert and cooperative. Normal mood and affect  ASSESSMENT AND PLAN: *Severe GERD with dysphagia:   She is s/p fundoplication approximately 24 years ago. Did well for 10 years after then needed PPIs again. She had a hiatal hernia repair in 2007 (at time of unrelated abdominal surgery) but has still required high dose PPIs. Still with severe symptoms despite twice daily PPI.  Patient has a complex surgical history, see HPI.  Would like an EGD to "rule out cancer".  Unwilling to have another surgery, understandably.  Will schedule for EGD with Dr. Hilarie Fredrickson. *Screening for CRC:  Will do another Cologuard.  Does not want colonoscopy but would be willing to re-consider if Cologuard is positive.  Had a negative Cologuard in 2015. *Constipation:  Will try Miralax daily.  **The risks, benefits, and alternatives to EGD were discussed with the patient and she consents to  proceed.   CC:  Glenda Chroman, MD   Addendum: Reviewed and agree with assessment and management plan. Pyrtle, Lajuan Lines, MD

## 2018-09-24 ENCOUNTER — Telehealth: Payer: Self-pay | Admitting: Internal Medicine

## 2018-09-24 DIAGNOSIS — R1310 Dysphagia, unspecified: Secondary | ICD-10-CM | POA: Insufficient documentation

## 2018-09-24 NOTE — Telephone Encounter (Signed)

## 2018-09-27 ENCOUNTER — Encounter: Payer: Self-pay | Admitting: Internal Medicine

## 2018-09-27 ENCOUNTER — Other Ambulatory Visit: Payer: Self-pay

## 2018-09-27 ENCOUNTER — Ambulatory Visit (AMBULATORY_SURGERY_CENTER): Payer: Medicare HMO | Admitting: Internal Medicine

## 2018-09-27 VITALS — BP 174/81 | HR 66 | Temp 98.4°F | Resp 28 | Ht 63.0 in | Wt 230.0 lb

## 2018-09-27 DIAGNOSIS — K219 Gastro-esophageal reflux disease without esophagitis: Secondary | ICD-10-CM | POA: Diagnosis not present

## 2018-09-27 DIAGNOSIS — K317 Polyp of stomach and duodenum: Secondary | ICD-10-CM | POA: Diagnosis not present

## 2018-09-27 DIAGNOSIS — K449 Diaphragmatic hernia without obstruction or gangrene: Secondary | ICD-10-CM | POA: Diagnosis not present

## 2018-09-27 DIAGNOSIS — K2270 Barrett's esophagus without dysplasia: Secondary | ICD-10-CM | POA: Diagnosis not present

## 2018-09-27 DIAGNOSIS — I10 Essential (primary) hypertension: Secondary | ICD-10-CM | POA: Diagnosis not present

## 2018-09-27 MED ORDER — SODIUM CHLORIDE 0.9 % IV SOLN: 500.0000 mL | Freq: Once | INTRAVENOUS | Status: DC

## 2018-09-27 MED ORDER — SUCRALFATE 1 GM/10ML PO SUSP: 1.0000 g | Freq: Every day | ORAL | 0 refills | Status: DC

## 2018-09-27 NOTE — Progress Notes (Signed)
Report to PACU, RN, vss, BBS= Clear.  

## 2018-09-27 NOTE — Progress Notes (Signed)
Called to room to assist during endoscopic procedure.  Patient ID and intended procedure confirmed with present staff. Received instructions for my participation in the procedure from the performing physician.  

## 2018-09-27 NOTE — Progress Notes (Signed)
Pt's states no medical or surgical changes since previsit or office visit. 

## 2018-09-27 NOTE — Op Note (Signed)
[facility] Patient Name: [name] Procedure Date: 09/27/2018 10:07 AM MRN: [id number] Endoscopist: Jerene Bears , MD Age: 68 Referring MD:  Date of Birth: Jul 11, 1950 Gender: Female Account #: [id number] Procedure:                Upper GI endoscopy Indications:              Gastro-esophageal reflux disease despite BID PPI,                            personal history of multiple abdominal surgeries                            including hiatal hernia repair/Nissen fundoplication Medicines:                Monitored Anesthesia Care Procedure:                Pre-Anesthesia Assessment:                           - Prior to the procedure, a History and Physical                            was performed, and patient medications and                            allergies were reviewed. The patient's tolerance of                            previous anesthesia was also reviewed. The risks                            and benefits of the procedure and the sedation                            options and risks were discussed with the patient.                            All questions were answered, and informed consent                            was obtained. Prior Anticoagulants: The patient has                            taken no previous anticoagulant or antiplatelet                            agents. ASA Grade Assessment: III - A patient with                            severe systemic disease. After reviewing the risks                            and benefits, the patient was deemed in  satisfactory condition to undergo the procedure.                           After obtaining informed consent, the endoscope was                            passed under direct vision. Throughout the                            procedure, the patient's blood pressure, pulse, and                            oxygen saturations were monitored continuously. The   Endoscope was introduced through the mouth, and                            advanced to the second part of duodenum. The upper                            GI endoscopy was accomplished without difficulty.                            The patient tolerated the procedure well. Scope In: Scope Out: Findings:                 The Z-line was irregular and was found 33 cm from                            the incisors. There was an isolated island of                            salmon-colored mucosa above the irregular Z-line                            This area was biopsied with a cold forceps for                            evaluation to rule out Barrett's Esophagus.                           A 3 cm hiatal hernia was present. This is located                            33-36 cm from the incisors. The GE junction is                            patulous.                           Multiple small sessile polyps were found in the                            gastric fundus and in the gastric body. These are  most consistent with fundic gland polyps. Several                            were biopsied with a cold forceps for histology.                           A medium amount of retained food (residue) was                            found in the gastric body. This makes complete                            visualization of the gastric mucosa difficult,                            particularly in the fundus and gastric body. This                            is very suggestive of gastroparesis (likely related                            to prior surgical intervention in the upper                            abdomen/stomach).                           The examined duodenum was normal. Complications:            No immediate complications. Estimated Blood Loss:     Estimated blood loss was minimal. Impression:               - Z-line irregular, 33 cm from the incisors.                             Biopsied.                           - Recurrent 3 cm hiatal hernia with patulous GE                            junction.                           - Multiple gastric polyps. Biopsied.                           - A medium amount of food (residue) in the stomach                            suggestive of gastroparesis.                           - Normal examined duodenum. Recommendation:           - Patient has a contact number available for  emergencies. The signs and symptoms of potential                            delayed complications were discussed with the                            patient. Return to normal activities tomorrow.                            Written discharge instructions were provided to the                            patient.                           - Resume previous diet. Reflux precautions (avoid                            eating and drinking without 2 hours of bedtime,                            remain upright as much as possible after meals).                           - Continue present medications. Can try sucralfate                            liquid 1 g at bedtime for heartburn.                           - Given recurrent hiatal hernia surgical repair can                            be considered as anatomy is greatest contributor to                            acid reflux and regurgitation.                           - Await pathology results. Jerene Bears, MD 09/27/2018 10:42:06 AM This report has been signed electronically.

## 2018-09-27 NOTE — Progress Notes (Signed)
Fayrene Fearing took temp and Rica Mote took vitals.

## 2018-09-27 NOTE — Patient Instructions (Signed)
Handout given for Hiatal Hernia/GERD  Start using your prescription of Famotidine 40 mg in the morning and 40 mg at night.  Stop using Nexium while you try this.  Start taking Carafate liquid 1gm prior to bedtime.  Follow GERD protocol and don't lie down within 2 hrs of your last meal.  YOU HAD AN ENDOSCOPIC PROCEDURE TODAY AT [facility]:   Refer to the procedure report that was given to you for any specific questions about what was found during the examination.  If the procedure report does not answer your questions, please call your gastroenterologist to clarify.  If you requested that your care partner not be given the details of your procedure findings, then the procedure report has been included in a sealed envelope for you to review at your convenience later.  YOU SHOULD EXPECT: Some feelings of bloating in the abdomen. Passage of more gas than usual.  Walking can help get rid of the air that was put into your GI tract during the procedure and reduce the bloating. If you had a lower endoscopy (such as a colonoscopy or flexible sigmoidoscopy) you may notice spotting of blood in your stool or on the toilet paper. If you underwent a bowel prep for your procedure, you may not have a normal bowel movement for a few days.  Please Note:  You might notice some irritation and congestion in your nose or some drainage.  This is from the oxygen used during your procedure.  There is no need for concern and it should clear up in a day or so.  SYMPTOMS TO REPORT IMMEDIATELY:   Following upper endoscopy (EGD)  Vomiting of blood or coffee ground material  New chest pain or pain under the shoulder blades  Painful or persistently difficult swallowing  New shortness of breath  Fever of 100F or higher  Black, tarry-looking stools  For urgent or emergent issues, a gastroenterologist can be reached at any hour by calling [phone].   DIET:  We do recommend a small meal at first,  but then you may proceed to your regular diet.  Drink plenty of fluids but you should avoid alcoholic beverages for 24 hours.  ACTIVITY:  You should plan to take it easy for the rest of today and you should NOT DRIVE or use heavy machinery until tomorrow (because of the sedation medicines used during the test).    FOLLOW UP: Our staff will call the number listed on your records 48-72 hours following your procedure to check on you and address any questions or concerns that you may have regarding the information given to you following your procedure. If we do not reach you, we will leave a message.  We will attempt to reach you two times.  During this call, we will ask if you have developed any symptoms of COVID 19. If you develop any symptoms (ie: fever, flu-like symptoms, shortness of breath, cough etc.) before then, please call [phone].  If you test positive for Covid 19 in the 2 weeks post procedure, please call and report this information to [place].    If any biopsies were taken you will be contacted by phone or by letter within the next 1-3 weeks.  Please call us at [phone] if you have not heard about the biopsies in 3 weeks.    SIGNATURES/CONFIDENTIALITY: You and/or your care partner have signed paperwork which will be entered into your electronic medical record.  These signatures attest to the fact  that that the information above on your After Visit Summary has been reviewed and is understood.  Full responsibility of the confidentiality of this discharge information lies with you and/or your care-partner.

## 2018-09-29 ENCOUNTER — Telehealth: Payer: Self-pay

## 2018-09-29 MED ORDER — FAMOTIDINE 20 MG PO TABS: 40.0000 mg | ORAL_TABLET | Freq: Two times a day (BID) | ORAL | 3 refills | Status: DC

## 2018-09-29 NOTE — Telephone Encounter (Signed)
  Follow up Call-  Call back number 09/27/2018  Post procedure Call Back phone  # [phone]  Permission to leave phone message Yes  Some recent data might be hidden     Patient questions:  Do you have a fever, pain , or abdominal swelling? No. Pain Score  0 *  Have you tolerated food without any problems? Yes.    Have you been able to return to your normal activities? Yes.    Do you have any questions about your discharge instructions: Diet   No. Medications  Yes.   Follow up visit  Yes.    Do you have questions or concerns about your Care? Yes.     Patient states that her Sucralfate prescription was $113 after insurance and would like to know if there is a less expensive alternative. If not, she's OK with it as it works for her. Also, she says Dr. Hilarie Fredrickson had discussed taking Famotidine 40mg  po bid. Patient told him she had some at home so it wasn't prescribed at discharge. She would now like a prescription of Famotodine called in to her [facility]. She also is unclear as to when she should follow-up with Dr. Hilarie Fredrickson. She thinks maybe after her cologuard results come in? Please clarify. Thank you.  Actions: * If pain score is 4 or above: No action needed, pain <4. 1. Have you developed a fever since your procedure? no  2.   Have you had an respiratory symptoms (SOB or cough) since your procedure? no  3.   Have you tested positive for COVID 19 since your procedure no  4.   Have you had any family members/close contacts diagnosed with the COVID 19 since your procedure?  no   If yes to any of these questions please route to Joylene John, RN and Alphonsa Gin, Therapist, sports.

## 2018-09-29 NOTE — Telephone Encounter (Signed)
[place] for Rx famotidine 40 mg every 12 hours

## 2018-09-29 NOTE — Telephone Encounter (Signed)
Returned patient's call after speaking with Dr. Hilarie Fredrickson. Sent RX for Famotidine 40 mg po bid per v.o.r.b. Dr. Hilarie Fredrickson to patient's [facility]. Dr. Hilarie Fredrickson would like patient to f/u in 2-3 months, advised this to patient and she verbalized understanding.

## 2018-09-29 NOTE — Telephone Encounter (Signed)
Called in error.  Patient already spoke with someone this morning for follow up call.

## 2018-09-30 ENCOUNTER — Encounter: Payer: Self-pay | Admitting: Internal Medicine

## 2018-09-30 DIAGNOSIS — K2270 Barrett's esophagus without dysplasia: Secondary | ICD-10-CM

## 2018-09-30 HISTORY — DX: Barrett's esophagus without dysplasia: K22.70

## 2018-10-04 ENCOUNTER — Telehealth: Payer: Self-pay | Admitting: Internal Medicine

## 2018-10-04 NOTE — Telephone Encounter (Signed)
Patient said that she has been feeling nausea and had a endo on 7/20

## 2018-10-04 NOTE — Telephone Encounter (Signed)
Pt states that for the past week she has had nausea in the am when she takes her meds. States she is usually able to eat some crackers and do ok. Reports today at lunch she ate a scoop of chicken salad and she feels very nauseated. She states she feels so full. She wants to know what she can do/take, she knows the chicken salad is not going to stay down. Please advise.

## 2018-10-04 NOTE — Telephone Encounter (Signed)
Patient with very probable gastroparesis based on recent endoscopy Will try low-dose metoclopramide 5 mg 3 times daily before meals and at bedtime only as needed for nausea and early fullness Please make her aware that in rare cases this medication can cause muscle twitching and if she experiences this she should stop this immediately and let me know Office follow-up next available if not already scheduled

## 2018-10-05 ENCOUNTER — Other Ambulatory Visit: Payer: Self-pay

## 2018-10-05 MED ORDER — METOCLOPRAMIDE HCL 5 MG PO TABS: 5.0000 mg | ORAL_TABLET | Freq: Three times a day (TID) | ORAL | 2 refills | Status: DC

## 2018-10-05 NOTE — Telephone Encounter (Signed)
Pt aware, script sent to pharmacy. 

## 2018-10-07 ENCOUNTER — Telehealth: Payer: Self-pay | Admitting: *Deleted

## 2018-10-07 NOTE — Telephone Encounter (Signed)
Called patient to get her scheduled for follow up after endoscopy as requested by Dr Hilarie Fredrickson. She indicates that she is still having constipation even on miralax 17 g daily. States that she still has to strain to have bm and then has pellet like stools. I advised that she should make sure to get plenty of fluids, specifically water. She should get in plenty of fruits and vegetables. She can also titrate miralax to 2 capfuls daily to see if this helps her constipation until she sees Dr Hilarie Fredrickson. She is to call back if these measures do not help.

## 2018-10-18 ENCOUNTER — Telehealth: Payer: Self-pay | Admitting: Internal Medicine

## 2018-10-18 MED ORDER — SUCRALFATE 1 G PO TABS: 1.0000 g | ORAL_TABLET | Freq: Every day | ORAL | 0 refills | Status: DC

## 2018-10-18 NOTE — Telephone Encounter (Signed)
Rx sent to mail service for tablet form of medication for patient to make into slurry.

## 2018-10-18 NOTE — Telephone Encounter (Signed)
Pt called requesting to switch from suspension to pill form of generic for carafate. Pt states that copay is more affordable in pill form, she will smash pill so she is able to take it. She needs rf sent to [facility] mail order for 90-day supply.

## 2018-11-23 ENCOUNTER — Ambulatory Visit: Payer: Medicare HMO | Admitting: Internal Medicine

## 2018-11-23 ENCOUNTER — Encounter: Payer: Self-pay | Admitting: Internal Medicine

## 2018-11-23 VITALS — BP 144/88 | HR 85 | Temp 98.1°F | Ht 63.0 in | Wt 218.0 lb

## 2018-11-23 DIAGNOSIS — K2270 Barrett's esophagus without dysplasia: Secondary | ICD-10-CM

## 2018-11-23 DIAGNOSIS — K5904 Chronic idiopathic constipation: Secondary | ICD-10-CM | POA: Diagnosis not present

## 2018-11-23 DIAGNOSIS — K219 Gastro-esophageal reflux disease without esophagitis: Secondary | ICD-10-CM

## 2018-11-23 DIAGNOSIS — K3184 Gastroparesis: Secondary | ICD-10-CM

## 2018-11-23 MED ORDER — FAMOTIDINE 40 MG PO TABS: 40.0000 mg | ORAL_TABLET | Freq: Two times a day (BID) | ORAL | 3 refills | Status: DC

## 2018-11-23 MED ORDER — FAMOTIDINE 40 MG PO TABS: 40.0000 mg | ORAL_TABLET | Freq: Two times a day (BID) | ORAL | 1 refills | Status: DC

## 2018-11-23 NOTE — Patient Instructions (Signed)
We have sent the following medications to your pharmacy for you to pick up at your convenience: Famotidine 40 mg every 12 hours   Continue carafate slurry as needed  Please take miralax every day on a consistent basis.  Follow a gastroparesis diet.  Follow up with Dr Hilarie Fredrickson in 3-4 months.  You will be due for a repeat cologuard vs colonoscopy in 01/2020.  If you are age 69 or older, your body mass index should be between 23-30. Your Body mass index is 38.62 kg/m. If this is out of the aforementioned range listed, please consider follow up with your Primary Care Provider.  If you are age 19 or younger, your body mass index should be between 19-25. Your Body mass index is 38.62 kg/m. If this is out of the aformentioned range listed, please consider follow up with your Primary Care Provider.

## 2018-11-24 ENCOUNTER — Encounter: Payer: Self-pay | Admitting: Internal Medicine

## 2018-11-24 NOTE — Progress Notes (Signed)
Subjective:    Patient ID: [name], female    DOB: May 08, 1950, 68 y.o.   MRN: [id number]  HPI [name] is a 68 year old female with a past medical history of GERD, dysphagia, Barrett's esophagus, constipation, hypertension, hyperlipidemia, hypothyroidism, obesity who is seen for follow-up.  She was last seen at the time of her upper endoscopy which I performed on 09/27/2018.  She presents alone today.  EGD on 09/27/2018 revealed an irregular Z line with an isolated island of salmon-colored mucosa above the GE junction.  This was biopsied.  A 3 cm hiatal hernia with a patulous GE junction was found.  There were multiple sessile polyps in the gastric body and fundus.  There was a medium amount of retained gastric body food interfering with visualization.  The examined duodenum was normal.  Biopsies from the distal esophagus confirm Barrett's.  Gastric polyp biopsies were fundic gland and hyperplastic without H. pylori metaplasia or malignancy.  After her procedure we changed therapy therapy and she has been taking Pepcid 40 mg every 12 hours.  She is also used some intermittent sucralfate slurry.  For her gastroparesis we tried metoclopramide 5 mg 3 times daily before meals and at bedtime.  She is also read much about gastroparesis and been following a gastroparesis diet.  She started papaya enzyme which she is found helpful.  She is taking the Pepcid 40 mg every 12 hours.  If she is late on the dose she will experience heartburn.  She is not had any further regurgitation including no waking up at night with regurgitation.  She is eating 5-6 small meals per day.  She is only using metoclopramide very sparingly possibly 2-3 times per week.  It does seem to help but due to possible side effects she is avoiding it.  The papaya enzyme also seems to be helping.  She is having some nausea after meals.  The symptoms have been present for years.  She is thankful to better understand what is going on with  her stomach, specifically related to the gastroparesis.  Her bowel movements have been irregular with still some constipation.  She is tried prune juice MiraLAX and Colace.  Even Ex-Lax from time to time.  No blood in her stool or melena.  She did complete a Cologuard for screening on 02/03/2017 and this was negative   Review of Systems As per HPI, otherwise negative  Current Medications, Allergies, Past Medical History, Past Surgical History, Family History and Social History were reviewed in [employer] record.     Objective:   Physical Exam BP (!) 144/88 (BP Location: Left Arm, Patient Position: Sitting, Cuff Size: Normal) Comment (Patient Position): forearm  Pulse 85   Temp 98.1 F (36.7 C) (Oral)   Ht 5\' 3"  (1.6 m)   Wt 218 lb (98.9 kg)   BMI 38.62 kg/m  Gen: awake, alert, NAD HEENT: anicteric, op clear CV: RRR, no mrg Pulm: CTA b/l Abd: soft, NT/ND, +BS throughout Ext: no c/c/e Neuro: nonfocal  CBC    Component Value Date/Time   WBC 4.5 01/16/2015 1008   RBC 4.95 01/16/2015 1008   HGB 14.8 01/16/2015 1008   HCT 45.0 01/16/2015 1008   PLT 167 01/16/2015 1008   MCV 90.9 01/16/2015 1008   MCH 29.9 01/16/2015 1008   MCHC 32.9 01/16/2015 1008   RDW 13.5 01/16/2015 1008   LYMPHSABS 1.7 01/16/2015 1008   MONOABS 0.4 01/16/2015 1008   EOSABS 0.1 01/16/2015 1008  BASOSABS 0.0 01/16/2015 1008   CMP     Component Value Date/Time   NA 142 01/16/2015 1008   K 4.4 01/16/2015 1008   CL 101 01/16/2015 1008   CO2 30 01/16/2015 1008   GLUCOSE 88 01/16/2015 1008   BUN 13 01/16/2015 1008   CREATININE 0.88 01/16/2015 1008   CALCIUM 9.6 01/16/2015 1008   PROT 6.6 01/16/2015 1008   ALBUMIN 4.2 01/16/2015 1008   AST 17 01/16/2015 1008   ALT 13 01/16/2015 1008   ALKPHOS 121 01/16/2015 1008   BILITOT 0.4 01/16/2015 1008   GFRNONAA 69 01/16/2015 1008   GFRAA 80 01/16/2015 1008        Assessment & Plan:  68 year old female with a past medical  history of GERD, dysphagia, Barrett's esophagus, constipation, hypertension, hyperlipidemia, hypothyroidism, obesity who is seen for follow-up.  1.  GERD/hiatal hernia/gastroparesis//Barrett's esophagus without dysplasia--we spent time today discussing her GERD and gastroparesis symptoms.  Symptoms have improved with medical therapy along with dietary modification.  We will have her continue famotidine 40 mg every 12 hours.  Continue following a gastroparesis diet.  She is using metoclopramide very sparingly and we discussed the possible risk of neurologic side effect.  At a low very sporadic dose a serious long-term complication is very unlikely.  2.  Constipation --she will continue with prune juice MiraLAX and Colace as needed.  3.  CRC screening --Cologuard -November 2018, repeat November 2021  66-month follow-up, sooner if needed  25 minutes spent with the patient today. Greater than 50% was spent in counseling and coordination of care with the patient

## 2018-12-02 DIAGNOSIS — H5203 Hypermetropia, bilateral: Secondary | ICD-10-CM | POA: Diagnosis not present

## 2018-12-03 ENCOUNTER — Telehealth: Payer: Self-pay | Admitting: Internal Medicine

## 2018-12-03 NOTE — Telephone Encounter (Signed)
Pt reported that insurance denied Cologuard order because "medical records needed."

## 2018-12-07 NOTE — Telephone Encounter (Signed)
Patient is advised that she is not due for cologuard until 2021 (as per our discussion at her recent office visit). She states she got another notice from cologuard. Seems she is still getting reminders from when we sent an order back in July. At that time, we were unaware another provider had ordered cologuard in 2018 and she was found to be negative. Therefore, I have contacted cologuard and have cancelled out the order for 09/2018 so she will stop getting reminders.

## 2019-01-19 ENCOUNTER — Other Ambulatory Visit: Payer: Self-pay | Admitting: Internal Medicine

## 2019-01-31 ENCOUNTER — Other Ambulatory Visit: Payer: Self-pay | Admitting: Internal Medicine

## 2019-02-17 DIAGNOSIS — Z299 Encounter for prophylactic measures, unspecified: Secondary | ICD-10-CM | POA: Diagnosis not present

## 2019-02-17 DIAGNOSIS — M4802 Spinal stenosis, cervical region: Secondary | ICD-10-CM | POA: Diagnosis not present

## 2019-02-17 DIAGNOSIS — I10 Essential (primary) hypertension: Secondary | ICD-10-CM | POA: Diagnosis not present

## 2019-02-17 DIAGNOSIS — E039 Hypothyroidism, unspecified: Secondary | ICD-10-CM | POA: Diagnosis not present

## 2019-03-06 ENCOUNTER — Other Ambulatory Visit: Payer: Self-pay | Admitting: Internal Medicine

## 2019-03-23 DIAGNOSIS — K3184 Gastroparesis: Secondary | ICD-10-CM | POA: Diagnosis not present

## 2019-03-23 DIAGNOSIS — Z6839 Body mass index (BMI) 39.0-39.9, adult: Secondary | ICD-10-CM | POA: Diagnosis not present

## 2019-03-23 DIAGNOSIS — I10 Essential (primary) hypertension: Secondary | ICD-10-CM | POA: Diagnosis not present

## 2019-03-23 DIAGNOSIS — Z299 Encounter for prophylactic measures, unspecified: Secondary | ICD-10-CM | POA: Diagnosis not present

## 2019-03-23 DIAGNOSIS — Z789 Other specified health status: Secondary | ICD-10-CM | POA: Diagnosis not present

## 2019-04-18 DIAGNOSIS — Z789 Other specified health status: Secondary | ICD-10-CM | POA: Diagnosis not present

## 2019-04-18 DIAGNOSIS — M7070 Other bursitis of hip, unspecified hip: Secondary | ICD-10-CM | POA: Diagnosis not present

## 2019-04-18 DIAGNOSIS — Z299 Encounter for prophylactic measures, unspecified: Secondary | ICD-10-CM | POA: Diagnosis not present

## 2019-05-08 DIAGNOSIS — F329 Major depressive disorder, single episode, unspecified: Secondary | ICD-10-CM | POA: Diagnosis not present

## 2019-05-08 DIAGNOSIS — E785 Hyperlipidemia, unspecified: Secondary | ICD-10-CM | POA: Diagnosis not present

## 2019-05-24 DIAGNOSIS — Z1339 Encounter for screening examination for other mental health and behavioral disorders: Secondary | ICD-10-CM | POA: Diagnosis not present

## 2019-05-24 DIAGNOSIS — E7800 Pure hypercholesterolemia, unspecified: Secondary | ICD-10-CM | POA: Diagnosis not present

## 2019-05-24 DIAGNOSIS — Z299 Encounter for prophylactic measures, unspecified: Secondary | ICD-10-CM | POA: Diagnosis not present

## 2019-05-24 DIAGNOSIS — R5383 Other fatigue: Secondary | ICD-10-CM | POA: Diagnosis not present

## 2019-05-24 DIAGNOSIS — Z0000 Encounter for general adult medical examination without abnormal findings: Secondary | ICD-10-CM | POA: Diagnosis not present

## 2019-05-24 DIAGNOSIS — Z1331 Encounter for screening for depression: Secondary | ICD-10-CM | POA: Diagnosis not present

## 2019-05-24 DIAGNOSIS — Z79899 Other long term (current) drug therapy: Secondary | ICD-10-CM | POA: Diagnosis not present

## 2019-05-24 DIAGNOSIS — E039 Hypothyroidism, unspecified: Secondary | ICD-10-CM | POA: Diagnosis not present

## 2019-05-24 DIAGNOSIS — Z6839 Body mass index (BMI) 39.0-39.9, adult: Secondary | ICD-10-CM | POA: Diagnosis not present

## 2019-05-24 DIAGNOSIS — Z7189 Other specified counseling: Secondary | ICD-10-CM | POA: Diagnosis not present

## 2019-05-24 DIAGNOSIS — Z1211 Encounter for screening for malignant neoplasm of colon: Secondary | ICD-10-CM | POA: Diagnosis not present

## 2019-06-23 ENCOUNTER — Other Ambulatory Visit: Payer: Self-pay | Admitting: Internal Medicine

## 2019-07-06 ENCOUNTER — Telehealth: Payer: Self-pay | Admitting: Internal Medicine

## 2019-07-06 MED ORDER — SUCRALFATE 1 G PO TABS: ORAL_TABLET | ORAL | 0 refills | Status: DC

## 2019-07-06 NOTE — Telephone Encounter (Signed)
Rx sent to [facility]  

## 2019-07-06 NOTE — Telephone Encounter (Signed)
Patient needs appointment as indicated at last refill request. Once appointment has been made, glad to refill at local pharmacy until appointment. At that time, will be glad to refill to mail in pharmacy. Can follow with extender if needed as well.

## 2019-07-13 ENCOUNTER — Other Ambulatory Visit: Payer: Self-pay | Admitting: *Deleted

## 2019-07-13 MED ORDER — FAMOTIDINE 40 MG PO TABS: 40.0000 mg | ORAL_TABLET | Freq: Two times a day (BID) | ORAL | 0 refills | Status: DC

## 2019-08-08 DIAGNOSIS — F329 Major depressive disorder, single episode, unspecified: Secondary | ICD-10-CM | POA: Diagnosis not present

## 2019-08-08 DIAGNOSIS — K219 Gastro-esophageal reflux disease without esophagitis: Secondary | ICD-10-CM | POA: Diagnosis not present

## 2019-08-15 DIAGNOSIS — Z1231 Encounter for screening mammogram for malignant neoplasm of breast: Secondary | ICD-10-CM | POA: Diagnosis not present

## 2019-09-14 ENCOUNTER — Encounter: Payer: Self-pay | Admitting: Internal Medicine

## 2019-09-14 ENCOUNTER — Ambulatory Visit (INDEPENDENT_AMBULATORY_CARE_PROVIDER_SITE_OTHER): Payer: Medicare HMO | Admitting: Internal Medicine

## 2019-09-14 VITALS — BP 124/65 | HR 78 | Ht 63.0 in | Wt 205.6 lb

## 2019-09-14 DIAGNOSIS — K3184 Gastroparesis: Secondary | ICD-10-CM

## 2019-09-14 DIAGNOSIS — K5904 Chronic idiopathic constipation: Secondary | ICD-10-CM

## 2019-09-14 DIAGNOSIS — Z1211 Encounter for screening for malignant neoplasm of colon: Secondary | ICD-10-CM

## 2019-09-14 DIAGNOSIS — K2270 Barrett's esophagus without dysplasia: Secondary | ICD-10-CM

## 2019-09-14 DIAGNOSIS — K219 Gastro-esophageal reflux disease without esophagitis: Secondary | ICD-10-CM

## 2019-09-14 MED ORDER — SUCRALFATE 1 G PO TABS: ORAL_TABLET | ORAL | 2 refills | Status: DC

## 2019-09-14 MED ORDER — FAMOTIDINE 40 MG PO TABS: 40.0000 mg | ORAL_TABLET | Freq: Two times a day (BID) | ORAL | 2 refills | Status: DC

## 2019-09-14 NOTE — Progress Notes (Signed)
   Subjective:    Patient ID: [name], female    DOB: [birth year], 69 y.o.   MRN: [id number]  HPI [name] is a 69 year old female with a history of GERD, dysphagia, Barrett's esophagus without dysplasia, constipation, hypertension, hyperlipidemia, hypothyroidism, obesity who is here for follow-up.  I saw her last in September 2020.  She is here alone today.  She reports that she has been feeling fairly well from a GI perspective.  She is using famotidine 40 mg twice daily.  She will use Carafate slurry 1 g most nights.  Without this she will have nocturnal burning reflux type pain.  She is not using metoclopramide.  Her bowel movements have been mostly regular though at times she will feel constipated.  She is using Metamucil approximately every other day which has helped.  She is avoiding cruciferous vegetables and foods such as beans which seem to make her abdominal discomfort and bloating worse.  She is following a more gastroparesis type/style diet.  No blood in her stool or melena.   Review of Systems As per HPI, otherwise negative  Current Medications, Allergies, Past Medical History, Past Surgical History, Family History and Social History were reviewed in [employer] record.      Objective:   Physical Exam BP 124/65   Pulse 78   Ht 5\' 3"  (1.6 m)   Wt 205 lb 9.6 oz (93.3 kg)   SpO2 98%   BMI 36.42 kg/m  Gen: awake, alert, NAD HEENT: anicteric CV: RRR, no mrg Pulm: CTA b/l Abd: soft, NT/ND, +BS throughout Ext: no c/c/e Neuro: nonfocal     Assessment & Plan:  69 year old female with a history of GERD, dysphagia, Barrett's esophagus without dysplasia, constipation, hypertension, hyperlipidemia, hypothyroidism, obesity who is here for follow-up.   1.  GERD with small hiatal hernia/gastroparesis/Barrett's esophagus without dysplasia --symptoms are currently well controlled with high-dose H2 blocker intermittent Carafate at night and dietary  modification.  We will continue current therapy --Continue famotidine 40 mg every 12 hours --Okay for sucralfate slurry 1 g nightly as needed --She is not using metoclopramide which overall is favorable given rare risk for side effects --Surveillance EGD is recommended July 2023  2.  Constipation --we will continue with Metamucil.  Can also continue prune lax, MiraLAX and/or Colace if needed  3.  CRC screening --Cologuard was negative in November 2018, I recommended we repeat Cologuard in November 2021.  She understands and would be agreeable to colonoscopy should Cologuard be positive  Annual follow-up, sooner if needed

## 2019-09-14 NOTE — Patient Instructions (Signed)
We have sent the following medications to your pharmacy for you to pick up at your convenience: Famotidine carafate  You will be due for a recall cologuard in 01/2020. We will send you a reminder in the mail when it gets closer to that time.  Continue metamucil.  Follow gastroparesis diet.  If you are age 70 or older, your body mass index should be between 23-30. Your Body mass index is 36.42 kg/m. If this is out of the aforementioned range listed, please consider follow up with your Primary Care Provider.  If you are age 68 or younger, your body mass index should be between 19-25. Your Body mass index is 36.42 kg/m. If this is out of the aformentioned range listed, please consider follow up with your Primary Care Provider.

## 2019-10-04 DIAGNOSIS — E079 Disorder of thyroid, unspecified: Secondary | ICD-10-CM | POA: Diagnosis not present

## 2019-10-04 DIAGNOSIS — K219 Gastro-esophageal reflux disease without esophagitis: Secondary | ICD-10-CM | POA: Diagnosis not present

## 2019-10-04 DIAGNOSIS — I10 Essential (primary) hypertension: Secondary | ICD-10-CM | POA: Diagnosis not present

## 2019-10-04 DIAGNOSIS — Z90710 Acquired absence of both cervix and uterus: Secondary | ICD-10-CM | POA: Diagnosis not present

## 2019-10-04 DIAGNOSIS — Z203 Contact with and (suspected) exposure to rabies: Secondary | ICD-10-CM | POA: Diagnosis not present

## 2019-10-04 DIAGNOSIS — Z23 Encounter for immunization: Secondary | ICD-10-CM | POA: Diagnosis not present

## 2019-10-04 DIAGNOSIS — Z9049 Acquired absence of other specified parts of digestive tract: Secondary | ICD-10-CM | POA: Diagnosis not present

## 2019-10-05 DIAGNOSIS — Z203 Contact with and (suspected) exposure to rabies: Secondary | ICD-10-CM | POA: Insufficient documentation

## 2019-10-07 DIAGNOSIS — E039 Hypothyroidism, unspecified: Secondary | ICD-10-CM | POA: Diagnosis not present

## 2019-10-07 DIAGNOSIS — I10 Essential (primary) hypertension: Secondary | ICD-10-CM | POA: Diagnosis not present

## 2019-10-07 DIAGNOSIS — F329 Major depressive disorder, single episode, unspecified: Secondary | ICD-10-CM | POA: Diagnosis not present

## 2019-10-07 DIAGNOSIS — E785 Hyperlipidemia, unspecified: Secondary | ICD-10-CM | POA: Diagnosis not present

## 2019-11-29 DIAGNOSIS — I10 Essential (primary) hypertension: Secondary | ICD-10-CM | POA: Diagnosis not present

## 2019-11-29 DIAGNOSIS — Z6838 Body mass index (BMI) 38.0-38.9, adult: Secondary | ICD-10-CM | POA: Diagnosis not present

## 2019-11-29 DIAGNOSIS — Z299 Encounter for prophylactic measures, unspecified: Secondary | ICD-10-CM | POA: Diagnosis not present

## 2019-11-29 DIAGNOSIS — Z713 Dietary counseling and surveillance: Secondary | ICD-10-CM | POA: Diagnosis not present

## 2019-11-29 DIAGNOSIS — E039 Hypothyroidism, unspecified: Secondary | ICD-10-CM | POA: Diagnosis not present

## 2019-12-08 DIAGNOSIS — F329 Major depressive disorder, single episode, unspecified: Secondary | ICD-10-CM | POA: Diagnosis not present

## 2019-12-08 DIAGNOSIS — I10 Essential (primary) hypertension: Secondary | ICD-10-CM | POA: Diagnosis not present

## 2019-12-08 DIAGNOSIS — E039 Hypothyroidism, unspecified: Secondary | ICD-10-CM | POA: Diagnosis not present

## 2019-12-08 DIAGNOSIS — E785 Hyperlipidemia, unspecified: Secondary | ICD-10-CM | POA: Diagnosis not present

## 2019-12-14 ENCOUNTER — Ambulatory Visit (INDEPENDENT_AMBULATORY_CARE_PROVIDER_SITE_OTHER): Payer: Medicare HMO

## 2019-12-14 ENCOUNTER — Other Ambulatory Visit: Payer: Self-pay

## 2019-12-14 ENCOUNTER — Ambulatory Visit (INDEPENDENT_AMBULATORY_CARE_PROVIDER_SITE_OTHER): Payer: Medicare HMO | Admitting: Podiatry

## 2019-12-14 DIAGNOSIS — M2042 Other hammer toe(s) (acquired), left foot: Secondary | ICD-10-CM | POA: Diagnosis not present

## 2019-12-14 DIAGNOSIS — M069 Rheumatoid arthritis, unspecified: Secondary | ICD-10-CM | POA: Diagnosis not present

## 2019-12-14 DIAGNOSIS — M2012 Hallux valgus (acquired), left foot: Secondary | ICD-10-CM | POA: Diagnosis not present

## 2019-12-14 DIAGNOSIS — M2011 Hallux valgus (acquired), right foot: Secondary | ICD-10-CM | POA: Diagnosis not present

## 2019-12-14 DIAGNOSIS — G250 Essential tremor: Secondary | ICD-10-CM | POA: Insufficient documentation

## 2019-12-14 DIAGNOSIS — M4802 Spinal stenosis, cervical region: Secondary | ICD-10-CM | POA: Insufficient documentation

## 2019-12-14 DIAGNOSIS — M2041 Other hammer toe(s) (acquired), right foot: Secondary | ICD-10-CM | POA: Diagnosis not present

## 2019-12-14 NOTE — Progress Notes (Signed)
   Subjective: 69 y.o. female presenting today as a new patient for evaluation of bilateral forefoot pain in the evenings.  Patient states during the day she has no symptoms.  She was recently diagnosed with rheumatoid arthritis.  She is concerned for her toes to ensure that they do not develop any sores or wounds.  She is looking for good conservative treatment.  Past Medical History:  Diagnosis Date  . Anemia   . Barrett's esophagus 09/30/2018   EGD July 2020 - short segment, no dysplasia (repeat EGD July 2023)  . Cerebrovascular disease   . Depression with anxiety   . Diabetes ([place])   . Diverticulosis   . Dyslipidemia   . Fundic gland polyps of stomach, benign   . GERD (gastroesophageal reflux disease)   . Hepatomegaly   . Hiatal hernia   . Hyperlipidemia   . Hypertension   . Hypothyroidism   . Obesity   . Obesity   . Osteoarthritis   . Pre-diabetes   . Tremor   . Urinary incontinence   . Ventral hernia   . Vitamin D deficiency    Objective: Physical Exam General: The patient is alert and oriented x3 in no acute distress.  Dermatology: Skin is cool, dry and supple bilateral lower extremities. Negative for open lesions or macerations.  Vascular: Palpable pedal pulses bilaterally. No edema or erythema noted. Capillary refill within normal limits.  Neurological: Epicritic and protective threshold grossly intact bilaterally.   Musculoskeletal Exam: Clinical evidence of bunion deformity noted to the respective foot. There is moderate pain on palpation range of motion of the first MPJ. Lateral deviation of the hallux noted consistent with hallux abductovalgus. Hammertoe contracture also noted on clinical exam to digits 2-5 of the bilateral foot. Symptomatic pain on palpation and range of motion also noted to the metatarsal phalangeal joints of the respective hammertoe digits.    Radiographic Exam: Increased intermetatarsal angle greater than 15 with a hallux abductus angle  greater than 30 noted on AP view. Moderate degenerative changes noted within the first MPJ. Contracture deformity also noted to the interphalangeal joints and MPJs of the digits of the respective hammertoes.  Diffuse degenerative changes with arthritic changes noted to the pedal joints of the foot bilateral    Assessment: 1. HAV w/ bunion deformity bilateral 2. Hammertoe deformity bilateral 3.  Nocturnal foot pain bilateral   Plan of Care:  1. Patient was evaluated. X-Rays reviewed. 2.  Patient is looking for very conservative treatment options.  During the day she has no symptoms and is not painful. 3.  Recommend OTC Voltaren 1% gel nightly 4.  Recommend good supportive sneakers during the day 5.  Return to clinic as needed    Edrick Kins, DPM [facility]  Dr. Edrick Kins, [address]                                        [place], [place] [address]                Office [phone]  Fax [phone]

## 2019-12-29 DIAGNOSIS — F32A Depression, unspecified: Secondary | ICD-10-CM | POA: Diagnosis not present

## 2019-12-29 DIAGNOSIS — Z789 Other specified health status: Secondary | ICD-10-CM | POA: Diagnosis not present

## 2019-12-29 DIAGNOSIS — Z6838 Body mass index (BMI) 38.0-38.9, adult: Secondary | ICD-10-CM | POA: Diagnosis not present

## 2019-12-29 DIAGNOSIS — Z299 Encounter for prophylactic measures, unspecified: Secondary | ICD-10-CM | POA: Diagnosis not present

## 2019-12-29 DIAGNOSIS — M1712 Unilateral primary osteoarthritis, left knee: Secondary | ICD-10-CM | POA: Diagnosis not present

## 2019-12-29 DIAGNOSIS — I10 Essential (primary) hypertension: Secondary | ICD-10-CM | POA: Diagnosis not present

## 2019-12-29 DIAGNOSIS — M545 Low back pain, unspecified: Secondary | ICD-10-CM | POA: Diagnosis not present

## 2019-12-29 DIAGNOSIS — Z23 Encounter for immunization: Secondary | ICD-10-CM | POA: Diagnosis not present

## 2020-01-06 DIAGNOSIS — E039 Hypothyroidism, unspecified: Secondary | ICD-10-CM | POA: Diagnosis not present

## 2020-01-06 DIAGNOSIS — I10 Essential (primary) hypertension: Secondary | ICD-10-CM | POA: Diagnosis not present

## 2020-01-06 DIAGNOSIS — F329 Major depressive disorder, single episode, unspecified: Secondary | ICD-10-CM | POA: Diagnosis not present

## 2020-01-06 DIAGNOSIS — E785 Hyperlipidemia, unspecified: Secondary | ICD-10-CM | POA: Diagnosis not present

## 2020-01-09 DIAGNOSIS — M545 Low back pain, unspecified: Secondary | ICD-10-CM | POA: Diagnosis not present

## 2020-01-09 DIAGNOSIS — M25562 Pain in left knee: Secondary | ICD-10-CM | POA: Diagnosis not present

## 2020-01-09 DIAGNOSIS — M79605 Pain in left leg: Secondary | ICD-10-CM | POA: Diagnosis not present

## 2020-01-09 DIAGNOSIS — Z299 Encounter for prophylactic measures, unspecified: Secondary | ICD-10-CM | POA: Diagnosis not present

## 2020-01-09 DIAGNOSIS — M4316 Spondylolisthesis, lumbar region: Secondary | ICD-10-CM | POA: Diagnosis not present

## 2020-01-09 DIAGNOSIS — I10 Essential (primary) hypertension: Secondary | ICD-10-CM | POA: Diagnosis not present

## 2020-01-09 DIAGNOSIS — M1712 Unilateral primary osteoarthritis, left knee: Secondary | ICD-10-CM | POA: Diagnosis not present

## 2020-02-07 DIAGNOSIS — K219 Gastro-esophageal reflux disease without esophagitis: Secondary | ICD-10-CM | POA: Diagnosis not present

## 2020-02-07 DIAGNOSIS — E7849 Other hyperlipidemia: Secondary | ICD-10-CM | POA: Diagnosis not present

## 2020-02-07 DIAGNOSIS — E039 Hypothyroidism, unspecified: Secondary | ICD-10-CM | POA: Diagnosis not present

## 2020-03-13 ENCOUNTER — Telehealth: Payer: Self-pay | Admitting: *Deleted

## 2020-03-13 NOTE — Telephone Encounter (Signed)
Patient is due for repeat cologuard testing. Dr Rhea Belton indicates that patient should have repeat testing and will need colonoscopy if positive. Patient verbalizes understanding of this and has agreed to go forward with test. I have sent demographic information and order over to [facility].

## 2020-03-21 DIAGNOSIS — Z1211 Encounter for screening for malignant neoplasm of colon: Secondary | ICD-10-CM | POA: Diagnosis not present

## 2020-03-21 DIAGNOSIS — Z1212 Encounter for screening for malignant neoplasm of rectum: Secondary | ICD-10-CM | POA: Diagnosis not present

## 2020-04-02 ENCOUNTER — Other Ambulatory Visit: Payer: Self-pay

## 2020-04-02 LAB — COLOGUARD
COLOGUARD: NEGATIVE
Cologuard: NEGATIVE

## 2020-04-21 ENCOUNTER — Other Ambulatory Visit: Payer: Self-pay | Admitting: Internal Medicine

## 2020-04-27 ENCOUNTER — Other Ambulatory Visit: Payer: Self-pay | Admitting: Internal Medicine

## 2020-05-24 DIAGNOSIS — I10 Essential (primary) hypertension: Secondary | ICD-10-CM | POA: Diagnosis not present

## 2020-05-24 DIAGNOSIS — W230XXA Caught, crushed, jammed, or pinched between moving objects, initial encounter: Secondary | ICD-10-CM | POA: Diagnosis not present

## 2020-05-24 DIAGNOSIS — Z90710 Acquired absence of both cervix and uterus: Secondary | ICD-10-CM | POA: Diagnosis not present

## 2020-05-24 DIAGNOSIS — S61217A Laceration without foreign body of left little finger without damage to nail, initial encounter: Secondary | ICD-10-CM | POA: Diagnosis not present

## 2020-05-24 DIAGNOSIS — Z9049 Acquired absence of other specified parts of digestive tract: Secondary | ICD-10-CM | POA: Diagnosis not present

## 2020-05-24 DIAGNOSIS — E079 Disorder of thyroid, unspecified: Secondary | ICD-10-CM | POA: Diagnosis not present

## 2020-05-24 DIAGNOSIS — Z880 Allergy status to penicillin: Secondary | ICD-10-CM | POA: Diagnosis not present

## 2020-05-24 DIAGNOSIS — Z79890 Hormone replacement therapy (postmenopausal): Secondary | ICD-10-CM | POA: Diagnosis not present

## 2020-05-24 DIAGNOSIS — K219 Gastro-esophageal reflux disease without esophagitis: Secondary | ICD-10-CM | POA: Diagnosis not present

## 2020-05-24 DIAGNOSIS — Z79899 Other long term (current) drug therapy: Secondary | ICD-10-CM | POA: Diagnosis not present

## 2020-05-29 DIAGNOSIS — Z7189 Other specified counseling: Secondary | ICD-10-CM | POA: Diagnosis not present

## 2020-05-29 DIAGNOSIS — Z0000 Encounter for general adult medical examination without abnormal findings: Secondary | ICD-10-CM | POA: Diagnosis not present

## 2020-05-29 DIAGNOSIS — Z1339 Encounter for screening examination for other mental health and behavioral disorders: Secondary | ICD-10-CM | POA: Diagnosis not present

## 2020-05-29 DIAGNOSIS — Z6838 Body mass index (BMI) 38.0-38.9, adult: Secondary | ICD-10-CM | POA: Diagnosis not present

## 2020-05-29 DIAGNOSIS — Z79899 Other long term (current) drug therapy: Secondary | ICD-10-CM | POA: Diagnosis not present

## 2020-05-29 DIAGNOSIS — E785 Hyperlipidemia, unspecified: Secondary | ICD-10-CM | POA: Diagnosis not present

## 2020-05-29 DIAGNOSIS — E039 Hypothyroidism, unspecified: Secondary | ICD-10-CM | POA: Diagnosis not present

## 2020-05-29 DIAGNOSIS — I10 Essential (primary) hypertension: Secondary | ICD-10-CM | POA: Diagnosis not present

## 2020-05-29 DIAGNOSIS — Z1331 Encounter for screening for depression: Secondary | ICD-10-CM | POA: Diagnosis not present

## 2020-05-29 DIAGNOSIS — B372 Candidiasis of skin and nail: Secondary | ICD-10-CM | POA: Diagnosis not present

## 2020-05-29 DIAGNOSIS — R739 Hyperglycemia, unspecified: Secondary | ICD-10-CM | POA: Diagnosis not present

## 2020-05-29 DIAGNOSIS — Z1211 Encounter for screening for malignant neoplasm of colon: Secondary | ICD-10-CM | POA: Diagnosis not present

## 2020-05-29 DIAGNOSIS — S61219A Laceration without foreign body of unspecified finger without damage to nail, initial encounter: Secondary | ICD-10-CM | POA: Diagnosis not present

## 2020-06-04 DIAGNOSIS — J9811 Atelectasis: Secondary | ICD-10-CM | POA: Diagnosis not present

## 2020-06-04 DIAGNOSIS — G250 Essential tremor: Secondary | ICD-10-CM | POA: Diagnosis not present

## 2020-06-04 DIAGNOSIS — J90 Pleural effusion, not elsewhere classified: Secondary | ICD-10-CM | POA: Diagnosis not present

## 2020-06-04 DIAGNOSIS — I10 Essential (primary) hypertension: Secondary | ICD-10-CM | POA: Diagnosis not present

## 2020-06-04 DIAGNOSIS — R0602 Shortness of breath: Secondary | ICD-10-CM | POA: Diagnosis not present

## 2020-06-04 DIAGNOSIS — Z299 Encounter for prophylactic measures, unspecified: Secondary | ICD-10-CM | POA: Diagnosis not present

## 2020-06-04 DIAGNOSIS — R0600 Dyspnea, unspecified: Secondary | ICD-10-CM | POA: Diagnosis not present

## 2020-06-05 DIAGNOSIS — I700 Atherosclerosis of aorta: Secondary | ICD-10-CM | POA: Diagnosis not present

## 2020-06-05 DIAGNOSIS — M899 Disorder of bone, unspecified: Secondary | ICD-10-CM | POA: Diagnosis not present

## 2020-06-05 DIAGNOSIS — J986 Disorders of diaphragm: Secondary | ICD-10-CM | POA: Diagnosis not present

## 2020-06-05 DIAGNOSIS — R0602 Shortness of breath: Secondary | ICD-10-CM | POA: Diagnosis not present

## 2020-06-05 DIAGNOSIS — K2289 Other specified disease of esophagus: Secondary | ICD-10-CM | POA: Diagnosis not present

## 2020-06-11 DIAGNOSIS — Z6837 Body mass index (BMI) 37.0-37.9, adult: Secondary | ICD-10-CM | POA: Diagnosis not present

## 2020-06-11 DIAGNOSIS — E039 Hypothyroidism, unspecified: Secondary | ICD-10-CM | POA: Diagnosis not present

## 2020-06-11 DIAGNOSIS — E785 Hyperlipidemia, unspecified: Secondary | ICD-10-CM | POA: Diagnosis not present

## 2020-06-11 DIAGNOSIS — L98499 Non-pressure chronic ulcer of skin of other sites with unspecified severity: Secondary | ICD-10-CM | POA: Diagnosis not present

## 2020-06-11 DIAGNOSIS — Z299 Encounter for prophylactic measures, unspecified: Secondary | ICD-10-CM | POA: Diagnosis not present

## 2020-06-11 DIAGNOSIS — G250 Essential tremor: Secondary | ICD-10-CM | POA: Diagnosis not present

## 2020-06-14 DIAGNOSIS — R9389 Abnormal findings on diagnostic imaging of other specified body structures: Secondary | ICD-10-CM | POA: Diagnosis not present

## 2020-06-14 DIAGNOSIS — M19032 Primary osteoarthritis, left wrist: Secondary | ICD-10-CM | POA: Diagnosis not present

## 2020-06-14 DIAGNOSIS — M799 Soft tissue disorder, unspecified: Secondary | ICD-10-CM | POA: Diagnosis not present

## 2020-06-14 DIAGNOSIS — M19011 Primary osteoarthritis, right shoulder: Secondary | ICD-10-CM | POA: Diagnosis not present

## 2020-06-14 DIAGNOSIS — M899 Disorder of bone, unspecified: Secondary | ICD-10-CM | POA: Diagnosis not present

## 2020-06-14 DIAGNOSIS — M19021 Primary osteoarthritis, right elbow: Secondary | ICD-10-CM | POA: Diagnosis not present

## 2020-06-14 DIAGNOSIS — M19022 Primary osteoarthritis, left elbow: Secondary | ICD-10-CM | POA: Diagnosis not present

## 2020-06-14 DIAGNOSIS — M19012 Primary osteoarthritis, left shoulder: Secondary | ICD-10-CM | POA: Diagnosis not present

## 2020-06-14 DIAGNOSIS — M47815 Spondylosis without myelopathy or radiculopathy, thoracolumbar region: Secondary | ICD-10-CM | POA: Diagnosis not present

## 2020-06-14 DIAGNOSIS — M19031 Primary osteoarthritis, right wrist: Secondary | ICD-10-CM | POA: Diagnosis not present

## 2020-07-05 ENCOUNTER — Other Ambulatory Visit: Payer: Self-pay | Admitting: Internal Medicine

## 2020-07-12 ENCOUNTER — Other Ambulatory Visit: Payer: Self-pay | Admitting: Internal Medicine

## 2020-07-31 DIAGNOSIS — J069 Acute upper respiratory infection, unspecified: Secondary | ICD-10-CM | POA: Diagnosis not present

## 2020-07-31 DIAGNOSIS — I10 Essential (primary) hypertension: Secondary | ICD-10-CM | POA: Diagnosis not present

## 2020-07-31 DIAGNOSIS — Z6837 Body mass index (BMI) 37.0-37.9, adult: Secondary | ICD-10-CM | POA: Diagnosis not present

## 2020-07-31 DIAGNOSIS — Z299 Encounter for prophylactic measures, unspecified: Secondary | ICD-10-CM | POA: Diagnosis not present

## 2020-07-31 DIAGNOSIS — Z789 Other specified health status: Secondary | ICD-10-CM | POA: Diagnosis not present

## 2020-07-31 DIAGNOSIS — M549 Dorsalgia, unspecified: Secondary | ICD-10-CM | POA: Diagnosis not present

## 2020-07-31 DIAGNOSIS — Z20822 Contact with and (suspected) exposure to covid-19: Secondary | ICD-10-CM | POA: Diagnosis not present

## 2020-08-15 ENCOUNTER — Encounter: Payer: Self-pay | Admitting: *Deleted

## 2020-08-16 ENCOUNTER — Ambulatory Visit: Payer: Medicare HMO | Admitting: Neurology

## 2020-08-16 ENCOUNTER — Encounter: Payer: Self-pay | Admitting: Neurology

## 2020-08-16 VITALS — BP 184/90 | HR 89 | Ht 62.0 in | Wt 201.6 lb

## 2020-08-16 DIAGNOSIS — G250 Essential tremor: Secondary | ICD-10-CM

## 2020-08-16 MED ORDER — ALPRAZOLAM 0.5 MG PO TABS: 0.5000 mg | ORAL_TABLET | Freq: Three times a day (TID) | ORAL | 0 refills | Status: DC | PRN

## 2020-08-16 NOTE — Progress Notes (Signed)
Reason for visit: Tremor  Referring physician: Dr. Grier Mitts is a 70 y.o. female  History of present illness:  [name] is a 70 year old right-handed white female with a history of essential tremor.  The patient indicates that she was about 70 years old when she first started noticing a tremor.  The tremor has gradually worsened as she has gotten older.  Her brother now has a tremor, and there are several first cousins on her father side of the family with tremor.  The tremor has affected head and neck and both arms, she has noted difficulty with handwriting and with using utensils such as knives.  She does not cook for fear of spilling something and burning herself.  The patient has been on gabapentin and primidone in the past, she has been evaluated for possible deep brain stimulator but was not felt to be an adequate candidate secondary to depression.  The patient has done fairly well with low-dose alprazolam, she will take 0.5 mg intermittently for the tremor when she needs it.  She does have good days and bad days with tremor.  The patient reports some problems with gastroparesis, she has some fecal and urinary incontinence issues and a history of a right hemidiaphragm problem.  She denies any falls or any significant gait instability.  She has no weakness of the extremities but she does note some numbness in the toes at times.  She returns to this office for further evaluation.  Past Medical History:  Diagnosis Date   Anemia    Barrett's esophagus 09/30/2018   EGD July 2020 - short segment, no dysplasia (repeat EGD July 2023)   Cerebrovascular disease    Depression with anxiety    Diabetes ([place])    Diverticulosis    Dyslipidemia    Fundic gland polyps of stomach, benign    GERD (gastroesophageal reflux disease)    Hepatomegaly    Hiatal hernia    Hyperlipidemia    Hypertension    Hypothyroidism    Obesity    Obesity    Osteoarthritis    Pre-diabetes     Tremor    Urinary incontinence    Ventral hernia    Vitamin D deficiency     Past Surgical History:  Procedure Laterality Date   ABDOMINAL HYSTERECTOMY     ABDOMINAL WALL MESH  REMOVAL     ABDOMINAL WALL MESH  REMOVAL     APPENDECTOMY     BREAST REDUCTION SURGERY     COLONOSCOPY     At a hospital. Can't remember   ESOPHAGOGASTRODUODENOSCOPY     serveral times. been years   GALLBLADDER SURGERY     LAPAROSCOPIC GASTRIC BANDING     TRACHEAL STENOSIS REPAIR W/ PERFUSION AND MLB      Family History  Problem Relation Age of Onset   Heart disease Mother    Heart disease Father    Stroke Son    Diabetes Maternal Aunt    Colon cancer Neg Hx    Esophageal cancer Neg Hx    Breast cancer Neg Hx    Rectal cancer Neg Hx    Stomach cancer Neg Hx     Social history:  reports that she has never smoked. She has never used smokeless tobacco. She reports current alcohol use. She reports that she does not use drugs.  Medications:  Prior to Admission medications   Medication Sig Start Date End Date Taking? Authorizing Provider  ALPRAZolam Duanne Moron) 1  MG tablet Take 1 tablet (1 mg total) by mouth 3 (three) times daily as needed. 11/26/14   Unk Pinto, MD  aspirin 81 MG tablet Take 81 mg by mouth daily.    [provider]  Aspirin Buf,CaCarb-MgCarb-MgO, 81 MG TABS Take by mouth.    [provider]  Biotin 10000 MCG TABS Take by mouth daily.    [provider]  bumetanide (BUMEX) 1 MG tablet 1 tablet daily. 04/22/18   [provider]  diphenhydrAMINE HCl (ALLERGY MED PO) Take 1 tablet by mouth daily as needed.    [provider]  DULoxetine (CYMBALTA) 60 MG capsule 1 capsule daily. 08/09/18   [provider]  EQ IBUPROFEN 200 MG CAPS Take 200 mg by mouth as needed. PRN 01/31/13   [provider]  famotidine (PEPCID) 40 MG tablet Take 1 tablet (40 mg total) by mouth every 12 (twelve) hours. NEEDS OFFICE VISIT FOR FURTHER REFILLS  07/12/20   Pyrtle, Lajuan Lines, MD  levothyroxine (SYNTHROID, LEVOTHROID) 50 MCG tablet TAKE 1 TABLET DAILY BEFORE BREAKFAST. 08/14/14   Vladimir Crofts, PA-C  Meclizine HCl (BONINE) 25 MG CHEW Chew by mouth.    [provider]  melatonin 3 MG TABS tablet Take by mouth.    [provider]  Methylsulfonylmethane (MSM) 1500 MG TABS Take 1 tablet by mouth 2 (two) times daily.    [provider]  modafinil (PROVIGIL) 200 MG tablet 1 tablet daily. 05/19/18   [provider]  PAPAYA ENZYME PO Take by mouth. After meals    [provider]  pravastatin (PRAVACHOL) 40 MG tablet TAKE 1 TABLET AT BEDTIME FOR CHOLESTEROL Patient taking differently: Take 20 mg by mouth daily.  03/29/15   Unk Pinto, MD  PROBIOTIC CAPS Take 2 capsules by mouth daily.     [provider]  sucralfate (CARAFATE) 1 g tablet CRUSH 1 TABLET AND DISSOLVE IN 10 ML WARM WATER, MIX WELL TO CREATE SLURRY AND DRINK ONCE EVERY NIGHT AT BEDTIME AS NEEDED 07/05/20   Pyrtle, Lajuan Lines, MD  Vitamin D, Cholecalciferol, 50 MCG (2000 UT) CAPS Take 1 capsule by mouth daily.    [provider]      Allergies  Allergen Reactions   Cefazolin Anaphylaxis   Ace Inhibitors Cough   Enalapril Cough   Quinapril    Trazodone And Nefazodone    Ivp Dye [Iodinated Diagnostic Agents]    Penicillin G Rash   Penicillins     ROS:  Out of a complete 14 system review of symptoms, the patient complains only of the following symptoms, and all other reviewed systems are negative.  Tremor Depression, anxiety Nausea  Blood pressure (!) 184/90, pulse 89, height 5\' 2"  (1.575 m), weight 201 lb 9.6 oz (91.4 kg).  Physical Exam  General: The patient is alert and cooperative at the time of the examination.  The patient is markedly obese.  Eyes: Pupils are equal, round, and reactive to light. Discs are flat bilaterally.  Neck: The neck is supple, no carotid bruits are noted.  Respiratory: The  respiratory examination is clear.  Cardiovascular: The cardiovascular examination reveals a regular rate and rhythm, no obvious murmurs or rubs are noted.  Skin: Extremities are without significant edema.  Neurologic Exam  Mental status: The patient is alert and oriented x 3 at the time of the examination. The patient has apparent normal recent and remote memory, with an apparently normal attention span and concentration ability.  Cranial nerves:  Facial symmetry is present. There is good sensation of the face to pinprick and soft touch bilaterally. The strength of the facial muscles and the muscles to head turning and shoulder shrug are normal bilaterally. Speech is well enunciated, no aphasia or dysarthria is noted. Extraocular movements are full. Visual fields are full. The tongue is midline, and the patient has symmetric elevation of the soft palate. No obvious hearing deficits are noted.  A side to side head and neck tremors noted.  Motor: The motor testing reveals 5 over 5 strength of all 4 extremities. Good symmetric motor tone is noted throughout.  Sensory: Sensory testing is intact to pinprick, soft touch, vibration sensation, and position sense on all 4 extremities. No evidence of extinction is noted.  Coordination: Cerebellar testing reveals good finger-nose-finger and heel-to-shin bilaterally.  The patient has intention tremors with finger-nose-finger bilaterally.  When drawing a spiral, tremor is translated into the handwriting.  Gait and station: Gait is normal. Tandem gait is unsteady.  Romberg is negative. No drift is seen.  Reflexes: Deep tendon reflexes are symmetric and normal bilaterally. Toes are downgoing bilaterally.   Assessment/Plan:  1.  Essential tremor  The patient will continue her alprazolam, I will call in the prescription for the 0.5 mg tablets taking 1 up to 3 times a day if needed.  She will follow-up here in 1 year, sooner if needed.  She may follow-up  with Dr. Brett Fairy.  Jill Alexanders MD 08/16/2020 9:51 AM  [facility] [address] [place], [place] [address]  Phone [phone] Fax [phone]

## 2020-10-31 DIAGNOSIS — Z299 Encounter for prophylactic measures, unspecified: Secondary | ICD-10-CM | POA: Diagnosis not present

## 2020-10-31 DIAGNOSIS — D492 Neoplasm of unspecified behavior of bone, soft tissue, and skin: Secondary | ICD-10-CM | POA: Diagnosis not present

## 2020-11-29 ENCOUNTER — Other Ambulatory Visit: Payer: Self-pay | Admitting: Internal Medicine

## 2020-12-02 ENCOUNTER — Other Ambulatory Visit: Payer: Self-pay | Admitting: Internal Medicine

## 2020-12-03 DIAGNOSIS — D225 Melanocytic nevi of trunk: Secondary | ICD-10-CM | POA: Diagnosis not present

## 2020-12-03 DIAGNOSIS — X32XXXD Exposure to sunlight, subsequent encounter: Secondary | ICD-10-CM | POA: Diagnosis not present

## 2020-12-03 DIAGNOSIS — L570 Actinic keratosis: Secondary | ICD-10-CM | POA: Diagnosis not present

## 2020-12-03 DIAGNOSIS — D485 Neoplasm of uncertain behavior of skin: Secondary | ICD-10-CM | POA: Diagnosis not present

## 2020-12-03 DIAGNOSIS — Z1283 Encounter for screening for malignant neoplasm of skin: Secondary | ICD-10-CM | POA: Diagnosis not present

## 2020-12-03 DIAGNOSIS — L905 Scar conditions and fibrosis of skin: Secondary | ICD-10-CM | POA: Diagnosis not present

## 2020-12-11 DIAGNOSIS — Z299 Encounter for prophylactic measures, unspecified: Secondary | ICD-10-CM | POA: Diagnosis not present

## 2020-12-11 DIAGNOSIS — D692 Other nonthrombocytopenic purpura: Secondary | ICD-10-CM | POA: Diagnosis not present

## 2020-12-11 DIAGNOSIS — F339 Major depressive disorder, recurrent, unspecified: Secondary | ICD-10-CM | POA: Diagnosis not present

## 2020-12-11 DIAGNOSIS — R948 Abnormal results of function studies of other organs and systems: Secondary | ICD-10-CM | POA: Diagnosis not present

## 2020-12-11 DIAGNOSIS — I700 Atherosclerosis of aorta: Secondary | ICD-10-CM | POA: Diagnosis not present

## 2020-12-11 DIAGNOSIS — I10 Essential (primary) hypertension: Secondary | ICD-10-CM | POA: Diagnosis not present

## 2020-12-12 ENCOUNTER — Ambulatory Visit: Payer: Medicare HMO | Admitting: Nurse Practitioner

## 2020-12-12 ENCOUNTER — Encounter: Payer: Self-pay | Admitting: Nurse Practitioner

## 2020-12-12 VITALS — BP 130/80 | HR 100 | Ht 62.0 in | Wt 200.0 lb

## 2020-12-12 DIAGNOSIS — K3184 Gastroparesis: Secondary | ICD-10-CM | POA: Diagnosis not present

## 2020-12-12 DIAGNOSIS — L98429 Non-pressure chronic ulcer of back with unspecified severity: Secondary | ICD-10-CM | POA: Diagnosis not present

## 2020-12-12 DIAGNOSIS — K219 Gastro-esophageal reflux disease without esophagitis: Secondary | ICD-10-CM | POA: Diagnosis not present

## 2020-12-12 DIAGNOSIS — D485 Neoplasm of uncertain behavior of skin: Secondary | ICD-10-CM | POA: Diagnosis not present

## 2020-12-12 MED ORDER — SUCRALFATE 1 G PO TABS: 1.0000 g | ORAL_TABLET | Freq: Every evening | ORAL | 3 refills | Status: DC

## 2020-12-12 MED ORDER — FAMOTIDINE 40 MG PO TABS: 40.0000 mg | ORAL_TABLET | Freq: Two times a day (BID) | ORAL | 3 refills | Status: DC

## 2020-12-12 NOTE — Progress Notes (Signed)
12/12/2020 AXEL MEAS [id number] 1950/03/16   Chief Complaint: Annual review, GERD follow up and medication refill   History of Present Illness: [name]. Ihnen is a 70 year old female with a past medical history of anxiety, depression, hypertension, hypothyroidism, essential tremors, obesity, GERD, Barrett's esophagus and gastroparesis. S/P hiatal hernia surgery. S/P lab band in 5701 in [place] complicated by post operative SBO, removal of lab band, ventral hernia repair complicated by an infected hematoma requiring a laparotomy which resulted in a 3 month hospital stay.  She is followed by Dr. Hilarie Fredrickson. She presents to our office today for her annual review and to refill her Famotidine and Sucralfate prescriptions.  She remains on Famotidine 40 mg 1 p.o. twice daily and Sucralfate 1 g p.o. nightly.  She has occasional heartburn.  No dysphagia.  She has intermittent nausea and vomits up partially digested food once or twice monthly which typically occurs if she eats a larger meal.  No hematemesis or coffee-ground emesis.  Her constipation is fairly well controlled by taking MiraLAX several days weekly.  She develops more frequent softer stools when she took MiraLAX daily.  No rectal bleeding or black stools.  She has a history of chronic depression which recently "kicked in" but significantly improved over the past 2 to 3 weeks after she started taking vitamin B12 supplement.  She intends to follow-up with her PCP for further depression follow-up and management.  Her most recent EGD was 09/27/2018 which identified a 3 cm hiatal hernia, multiple fundic gland and hyperplastic gastric polyps and a moderate amount of food residue was found in the stomach suggestive of gastroparesis.  She reported completing a colonoscopy in the past prior to her 2007 Lap-Band surgery.  Due to her extensive complications following her Lap-Band surgery, she has avoided any further invasive colonoscopy procedures and is  followed by Cologuard testing.  Her most recent Cologuard test was 04/02/2020 and was negative.  No laboratory study results in epic.  She has a routine labs with her PCP.  EGD 09/27/2018 - Z-line irregular, 33 cm from the incisors. Biopsied. - Recurrent 3 cm hiatal hernia with patulous GE junction. - Multiple gastric polyps. Biopsied. - A medium amount of food (residue) in the stomach suggestive of gastroparesis. - Normal examined duodenum. - 3 year recall EGD 1. Surgical [P], gastric polyp BX - FUNDIC GLAND POLYP - HYPERPLASTIC GASTRIC POLYP - NO H. PYLORI, INTESTINAL METAPLASIA OR MALIGNANCY IDENTIFIED 2. Surgical [P], irregular Z line - BARRETT ESOPHAGUS - NO DYSPLASIA OR MALIGNANCY IDENTIFIED  Past Medical History:  Diagnosis Date   Anemia    Barrett's esophagus 09/30/2018   EGD July 2020 - short segment, no dysplasia (repeat EGD July 2023)   Cerebrovascular disease    Depression with anxiety    Diabetes ([place])    Diverticulosis    Dyslipidemia    Fundic gland polyps of stomach, benign    GERD (gastroesophageal reflux disease)    Hepatomegaly    Hiatal hernia    Hyperlipidemia    Hypertension    Hypothyroidism    Obesity    Obesity    Osteoarthritis    Pre-diabetes    Tremor    Urinary incontinence    Ventral hernia    Vitamin D deficiency    Past Surgical History:  Procedure Laterality Date   ABDOMINAL HYSTERECTOMY     ABDOMINAL WALL MESH  REMOVAL     ABDOMINAL WALL MESH  REMOVAL  APPENDECTOMY     BREAST REDUCTION SURGERY     COLONOSCOPY     At a hospital. Can't remember   ESOPHAGOGASTRODUODENOSCOPY     serveral times. been years   GALLBLADDER SURGERY     LAPAROSCOPIC GASTRIC BANDING     TRACHEAL STENOSIS REPAIR W/ PERFUSION AND MLB     Current Outpatient Medications on File Prior to Visit  Medication Sig Dispense Refill   ALPRAZolam (XANAX) 0.5 MG tablet Take 1 tablet (0.5 mg total) by mouth 3 (three) times daily as needed for anxiety. 270 tablet 0    aspirin 81 MG tablet Take 81 mg by mouth daily.     Aspirin Buf,CaCarb-MgCarb-MgO, 81 MG TABS Take by mouth.     bumetanide (BUMEX) 1 MG tablet 1 tablet daily.     diphenhydrAMINE HCl (ALLERGY MED PO) Take 1 tablet by mouth daily as needed.     DULoxetine (CYMBALTA) 60 MG capsule 1 capsule daily.     EQ IBUPROFEN 200 MG CAPS Take 200 mg by mouth as needed. PRN     levothyroxine (SYNTHROID, LEVOTHROID) 50 MCG tablet TAKE 1 TABLET DAILY BEFORE BREAKFAST. 90 tablet 1   Meclizine HCl 25 MG CHEW Chew by mouth.     melatonin 3 MG TABS tablet Take by mouth.     Methylsulfonylmethane (MSM) 1500 MG TABS Take 1 tablet by mouth 2 (two) times daily.     modafinil (PROVIGIL) 200 MG tablet 1 tablet daily.     PAPAYA ENZYME PO Take by mouth. After meals     pravastatin (PRAVACHOL) 40 MG tablet TAKE 1 TABLET AT BEDTIME FOR CHOLESTEROL (Patient taking differently: Take 20 mg by mouth daily.) 90 tablet 1   PROBIOTIC CAPS Take 2 capsules by mouth daily.      Vitamin D, Cholecalciferol, 50 MCG (2000 UT) CAPS Take 1 capsule by mouth daily.     No current facility-administered medications on file prior to visit.   Allergies  Allergen Reactions   Cefazolin Anaphylaxis   Ace Inhibitors Cough   Enalapril Cough   Quinapril    Trazodone And Nefazodone    Ivp Dye [Iodinated Diagnostic Agents]    Penicillin G Rash   Penicillins    Current Medications, Allergies, Past Medical History, Past Surgical History, Family History and Social History were reviewed in [employer] record.  Review of Systems:   Constitutional: Negative for fever, sweats, chills or weight loss.  Respiratory: Negative for shortness of breath.   Cardiovascular: Negative for chest pain, palpitations and leg swelling.  Gastrointestinal: See HPI.  Musculoskeletal: Negative for back pain or muscle aches.  Neurological: Negative for dizziness, headaches or paresthesias.   Physical Exam: BP 130/80   Pulse 100   Ht 5\' 2"   (1.575 m)   Wt 200 lb (90.7 kg)   SpO2 97%   BMI 36.58 kg/m    Wt Readings from Last 3 Encounters:  08/16/20 201 lb 9.6 oz (91.4 kg)  09/14/19 205 lb 9.6 oz (93.3 kg)  11/23/18 218 lb (98.9 kg)    General: 70 year old female in no acute distress. Head: Normocephalic and atraumatic. Eyes: No scleral icterus. Conjunctiva pink . Ears: Normal auditory acuity. Mouth: Dentition intact. No ulcers or lesions.  Lungs: Clear throughout to auscultation. Heart: Regular rate and rhythm, no murmur. Abdomen: Soft, nontender and nondistended. No masses or hepatomegaly. Normal bowel sounds x 4 quadrants.  Extensive upper and lower midline abdominal scars intact. Rectal: Deferred. Musculoskeletal: Symmetrical with no gross deformities.  Extremities: No edema. Neurological: Alert oriented x 4. No focal deficits.  Psychological: Alert and cooperative. Normal mood and affect  Assessment and Recommendations:  58) 70 year old female with a history of GERD with Barrett's esophagus -EGD due 09/2021 -Continue Famotidine 40mg  po bid and Sucralfate  -Request copy of most recent CBC and CMP from PCPs office  2) Chronic nausea with intermittent vomiting, gastroparesis per EGD 09/2018 -Advise 3-4 small snacks sized meals daily -Patient will call our office if she has worsening nausea/vomiting  2) Colon cancer screening.  Negative Cologuard 03/2020.  -Cologuard due 03/2023

## 2020-12-12 NOTE — Patient Instructions (Addendum)
If you are age 71 or older, your body mass index should be between 23-30. Your Body mass index is 36.58 kg/m. If this is out of the aforementioned range listed, please consider follow up with your Primary Care Provider.  The [facility] providers would like to encourage you to use [facility] to communicate with providers for non-urgent requests or questions.  Due to long hold times on the telephone, sending your provider a message by [facility] may be faster and more efficient way to get a response. Please allow 48 business hours for a response.  Please remember that this is for non-urgent requests/questions.  We have refilled your medications.  Your next EGD is due 09/2021.  We will obtain a copy of your recent lab work from your primary care office.  It was great seeing you today! Thank you for entrusting me with your care and choosing [facility].  Noralyn Pick, CRNP

## 2020-12-14 DIAGNOSIS — M8588 Other specified disorders of bone density and structure, other site: Secondary | ICD-10-CM | POA: Diagnosis not present

## 2020-12-14 DIAGNOSIS — R948 Abnormal results of function studies of other organs and systems: Secondary | ICD-10-CM | POA: Diagnosis not present

## 2020-12-14 DIAGNOSIS — M419 Scoliosis, unspecified: Secondary | ICD-10-CM | POA: Diagnosis not present

## 2020-12-14 DIAGNOSIS — I700 Atherosclerosis of aorta: Secondary | ICD-10-CM | POA: Diagnosis not present

## 2020-12-14 DIAGNOSIS — M8938 Hypertrophy of bone, other site: Secondary | ICD-10-CM | POA: Diagnosis not present

## 2020-12-14 DIAGNOSIS — M899 Disorder of bone, unspecified: Secondary | ICD-10-CM | POA: Diagnosis not present

## 2021-01-02 NOTE — Progress Notes (Signed)
Addendum: Reviewed and agree with assessment and management plan. Greenley Martone M, MD  

## 2021-01-06 ENCOUNTER — Telehealth: Payer: Self-pay | Admitting: Nurse Practitioner

## 2021-01-06 NOTE — Telephone Encounter (Signed)
[name], please contact the patient and let her know I did receive a copy of her most recent laboratory study results as requested from her PCP which were dated 05/29/2020.  These labs showed a mildly elevated alk phosphatase level and her creatinine level was elevated at 1.33.  I would recommend for the patient to have repeat BMP, hepatic panel and GGT level in the 2 weeks is fine.  Please provide the patient with a lab order.  Diagnosis: GERD.  Elevated alk phos level.  Thank you

## 2021-01-07 ENCOUNTER — Other Ambulatory Visit: Payer: Self-pay

## 2021-01-07 DIAGNOSIS — R748 Abnormal levels of other serum enzymes: Secondary | ICD-10-CM

## 2021-01-07 NOTE — Telephone Encounter (Signed)
Patient notified. She agrees to this plan of care. Orders put into Epic. She will come at her convenience for her non-fasting labs in the next 2 weeks.

## 2021-01-15 ENCOUNTER — Other Ambulatory Visit (INDEPENDENT_AMBULATORY_CARE_PROVIDER_SITE_OTHER): Payer: Medicare HMO

## 2021-01-15 DIAGNOSIS — R748 Abnormal levels of other serum enzymes: Secondary | ICD-10-CM | POA: Diagnosis not present

## 2021-01-15 DIAGNOSIS — D485 Neoplasm of uncertain behavior of skin: Secondary | ICD-10-CM | POA: Diagnosis not present

## 2021-01-15 DIAGNOSIS — D225 Melanocytic nevi of trunk: Secondary | ICD-10-CM | POA: Diagnosis not present

## 2021-01-15 LAB — BASIC METABOLIC PANEL
BUN: 17 mg/dL (ref 6–23)
CO2: 31 mEq/L (ref 19–32)
Calcium: 9.7 mg/dL (ref 8.4–10.5)
Chloride: 101 mEq/L (ref 96–112)
Creatinine, Ser: 1.02 mg/dL (ref 0.40–1.20)
GFR: 55.49 mL/min — ABNORMAL LOW (ref >60.00)
Glucose, Bld: 118 mg/dL — ABNORMAL HIGH (ref 70–99)
Potassium: 4 mEq/L (ref 3.5–5.1)
Sodium: 140 mEq/L (ref 135–145)

## 2021-01-15 LAB — HEPATIC FUNCTION PANEL
ALT: 7 U/L (ref 0–35)
AST: 13 U/L (ref 0–37)
Albumin: 4.3 g/dL (ref 3.5–5.2)
Alkaline Phosphatase: 103 U/L (ref 39–117)
Bilirubin, Direct: 0.1 mg/dL (ref 0.0–0.3)
Total Bilirubin: 0.6 mg/dL (ref 0.2–1.2)
Total Protein: 7.1 g/dL (ref 6.0–8.3)

## 2021-01-15 LAB — GAMMA GT: GGT: 6 U/L — ABNORMAL LOW (ref 7–51)

## 2021-01-23 DIAGNOSIS — D485 Neoplasm of uncertain behavior of skin: Secondary | ICD-10-CM | POA: Diagnosis not present

## 2021-01-23 DIAGNOSIS — L988 Other specified disorders of the skin and subcutaneous tissue: Secondary | ICD-10-CM | POA: Diagnosis not present

## 2021-03-12 ENCOUNTER — Other Ambulatory Visit: Payer: Self-pay | Admitting: *Deleted

## 2021-03-12 MED ORDER — ALPRAZOLAM 0.5 MG PO TABS: 0.5000 mg | ORAL_TABLET | Freq: Two times a day (BID) | ORAL | 1 refills | Status: DC | PRN

## 2021-03-12 NOTE — Telephone Encounter (Signed)
Received request from [facility] to re fill aprazolam. FU scheduled, [place] [facility] registry ok, last refilled  08/16/20 #270, 0 refills. Refill pended to work in [place].

## 2021-05-28 DIAGNOSIS — D225 Melanocytic nevi of trunk: Secondary | ICD-10-CM | POA: Diagnosis not present

## 2021-05-28 DIAGNOSIS — Z1283 Encounter for screening for malignant neoplasm of skin: Secondary | ICD-10-CM | POA: Diagnosis not present

## 2021-05-28 DIAGNOSIS — L905 Scar conditions and fibrosis of skin: Secondary | ICD-10-CM | POA: Diagnosis not present

## 2021-05-28 DIAGNOSIS — D485 Neoplasm of uncertain behavior of skin: Secondary | ICD-10-CM | POA: Diagnosis not present

## 2021-06-04 DIAGNOSIS — D692 Other nonthrombocytopenic purpura: Secondary | ICD-10-CM | POA: Diagnosis not present

## 2021-06-04 DIAGNOSIS — Z7189 Other specified counseling: Secondary | ICD-10-CM | POA: Diagnosis not present

## 2021-06-04 DIAGNOSIS — Z1331 Encounter for screening for depression: Secondary | ICD-10-CM | POA: Diagnosis not present

## 2021-06-04 DIAGNOSIS — I10 Essential (primary) hypertension: Secondary | ICD-10-CM | POA: Diagnosis not present

## 2021-06-04 DIAGNOSIS — F339 Major depressive disorder, recurrent, unspecified: Secondary | ICD-10-CM | POA: Diagnosis not present

## 2021-06-04 DIAGNOSIS — Z1339 Encounter for screening examination for other mental health and behavioral disorders: Secondary | ICD-10-CM | POA: Diagnosis not present

## 2021-06-04 DIAGNOSIS — Z0000 Encounter for general adult medical examination without abnormal findings: Secondary | ICD-10-CM | POA: Diagnosis not present

## 2021-06-04 DIAGNOSIS — Z6836 Body mass index (BMI) 36.0-36.9, adult: Secondary | ICD-10-CM | POA: Diagnosis not present

## 2021-06-04 DIAGNOSIS — Z79899 Other long term (current) drug therapy: Secondary | ICD-10-CM | POA: Diagnosis not present

## 2021-06-04 DIAGNOSIS — E7800 Pure hypercholesterolemia, unspecified: Secondary | ICD-10-CM | POA: Diagnosis not present

## 2021-06-04 DIAGNOSIS — R5383 Other fatigue: Secondary | ICD-10-CM | POA: Diagnosis not present

## 2021-06-04 DIAGNOSIS — E039 Hypothyroidism, unspecified: Secondary | ICD-10-CM | POA: Diagnosis not present

## 2021-06-04 DIAGNOSIS — I700 Atherosclerosis of aorta: Secondary | ICD-10-CM | POA: Diagnosis not present

## 2021-06-04 DIAGNOSIS — Z299 Encounter for prophylactic measures, unspecified: Secondary | ICD-10-CM | POA: Diagnosis not present

## 2021-06-05 ENCOUNTER — Ambulatory Visit: Payer: Medicare HMO | Admitting: Neurology

## 2021-06-05 ENCOUNTER — Encounter: Payer: Self-pay | Admitting: Neurology

## 2021-06-05 VITALS — BP 162/77 | HR 81 | Ht 62.0 in | Wt 191.0 lb

## 2021-06-05 DIAGNOSIS — G250 Essential tremor: Secondary | ICD-10-CM

## 2021-06-05 NOTE — Progress Notes (Signed)
? ?PATIENT: [name] ?DOB: 05/26/1950 ? ?REASON FOR VISIT: Follow up for essential tremor ?HISTORY FROM: Patient ?PRIMARY NEUROLOGIST: Dr. Brett Fairy ? ?ASSESSMENT AND PLAN ?71 y.o. year old female  ? ?1.  Essential tremor ? ?-Has Xanax as needed, rarely uses this secondary to drowsiness ?-Has tried beta-blocker, primidone, Topamax, gabapentin in the past ?-Has been evaluated for DBS, found to not be a candidate by Dr. Carles Collet in 2016 ?-Tremor appears to gradually worsen over time, she appears to have made necessary adaptions, accommodations, will continue to follow over time, will return back in 6 months or sooner if needed, we discussed considering referral to academic center to see if any other treatment options may be available ? ?HISTORY OF PRESENT ILLNESS: ?Today 06/05/21 ?[name] here today for follow-up. Has tremor in both hands, head, worsened overtime. Isn't taking the Xanax at all, has it for 0.5 mg twice daily if needed, it makes her sleepy. Doesn't cook any longer because of tremor. Back in 90's had her eye make tattooed on, couldn't do her make up now.  No falls. Has 2 brother with essential tremor, her parents passed away early.  In the past has tried and failed beta-blocker, primidone, gabapentin, Topamax.  Was evaluated in 2016 by Dr. Carles Collet for DBS, was felt to not be a candidate given significant depression, also the fact that the tremor started after surgery for lap band that had multiple complications including sepsis, could have been a hypoxic event involved. ? ?HISTORY  ?08/16/2020 Dr. Jannifer Franklin: [name] is a 71 year old right-handed white female with a history of essential tremor.  The patient indicates that she was about 71 years old when she first started noticing a tremor.  The tremor has gradually worsened as she has gotten older.  Her brother now has a tremor, and there are several first cousins on her father side of the family with tremor.  The tremor has affected head and neck and both  arms, she has noted difficulty with handwriting and with using utensils such as knives.  She does not cook for fear of spilling something and burning herself.  The patient has been on gabapentin and primidone in the past, she has been evaluated for possible deep brain stimulator but was not felt to be an adequate candidate secondary to depression.  The patient has done fairly well with low-dose alprazolam, she will take 0.5 mg intermittently for the tremor when she needs it.  She does have good days and bad days with tremor.  The patient reports some problems with gastroparesis, she has some fecal and urinary incontinence issues and a history of a right hemidiaphragm problem.  She denies any falls or any significant gait instability.  She has no weakness of the extremities but she does note some numbness in the toes at times.  She returns to this office for further evaluation. ? ?REVIEW OF SYSTEMS: Out of a complete 14 system review of symptoms, the patient complains only of the following symptoms, and all other reviewed systems are negative. ? ?See HPI ? ?ALLERGIES: ?Allergies  ?Allergen Reactions  ? Cefazolin Anaphylaxis  ? Ace Inhibitors Cough  ? Enalapril Cough  ? Quinapril   ? Trazodone And Nefazodone   ? Ivp Dye [Iodinated Contrast Media]   ? Penicillin G Rash  ? Penicillins   ? ? ?HOME MEDICATIONS: ?Outpatient Medications Prior to Visit  ?Medication Sig Dispense Refill  ? ALPRAZolam (XANAX) 0.5 MG tablet Take 1 tablet (0.5 mg total) by mouth  2 (two) times daily as needed for anxiety. 60 tablet 1  ? aspirin 81 MG tablet Take 81 mg by mouth daily.    ? Aspirin Buf,CaCarb-MgCarb-MgO, 81 MG TABS Take by mouth.    ? bumetanide (BUMEX) 1 MG tablet 1 tablet daily.    ? diphenhydrAMINE HCl (ALLERGY MED PO) Take 1 tablet by mouth daily as needed.    ? DULoxetine (CYMBALTA) 60 MG capsule 1 capsule daily.    ? famotidine (PEPCID) 40 MG tablet Take 1 tablet (40 mg total) by mouth every 12 (twelve) hours. 180 tablet 3  ?  levothyroxine (SYNTHROID, LEVOTHROID) 50 MCG tablet TAKE 1 TABLET DAILY BEFORE BREAKFAST. 90 tablet 1  ? Meclizine HCl 25 MG CHEW Chew by mouth.    ? Methylsulfonylmethane (MSM) 1500 MG TABS Take 1 tablet by mouth 2 (two) times daily.    ? modafinil (PROVIGIL) 200 MG tablet 1 tablet daily.    ? PAPAYA ENZYME PO Take by mouth. After meals    ? pravastatin (PRAVACHOL) 40 MG tablet TAKE 1 TABLET AT BEDTIME FOR CHOLESTEROL (Patient taking differently: Take 20 mg by mouth daily.) 90 tablet 1  ? PROBIOTIC CAPS Take 2 capsules by mouth daily.     ? sucralfate (CARAFATE) 1 g tablet Take 1 tablet (1 g total) by mouth at bedtime. Do not take within 2 hours of other medications. 90 tablet 3  ? UNABLE TO FIND Take 1 capsule by mouth daily. Med Name: K-2 150 mcg 1x daily ?Probiotic 100 mil 1x ?MSM 1500 mg 2x ?B12 5000 mcg 1x ?Papaya Enzymes 3 after meals    ? Vitamin D, Cholecalciferol, 50 MCG (2000 UT) CAPS Take 1 capsule by mouth daily.    ? EQ IBUPROFEN 200 MG CAPS Take 200 mg by mouth as needed. PRN    ? melatonin 3 MG TABS tablet Take by mouth.    ? ?No facility-administered medications prior to visit.  ? ? ?PAST MEDICAL HISTORY: ?Past Medical History:  ?Diagnosis Date  ? Anemia   ? Barrett's esophagus 09/30/2018  ? EGD July 2020 - short segment, no dysplasia (repeat EGD July 2023)  ? Cerebrovascular disease   ? Depression with anxiety   ? Diabetes ([place])   ? Diverticulosis   ? Dyslipidemia   ? Fundic gland polyps of stomach, benign   ? GERD (gastroesophageal reflux disease)   ? Hepatomegaly   ? Hiatal hernia   ? Hyperlipidemia   ? Hypertension   ? Hypothyroidism   ? Obesity   ? Obesity   ? Osteoarthritis   ? Pre-diabetes   ? Tremor   ? Urinary incontinence   ? Ventral hernia   ? Vitamin D deficiency   ? ? ?PAST SURGICAL HISTORY: ?Past Surgical History:  ?Procedure Laterality Date  ? ABDOMINAL HYSTERECTOMY    ? ABDOMINAL WALL MESH  REMOVAL    ? ABDOMINAL WALL MESH  REMOVAL    ? APPENDECTOMY    ? BREAST REDUCTION SURGERY    ?  COLONOSCOPY    ? At a hospital. Can't remember  ? ESOPHAGOGASTRODUODENOSCOPY    ? serveral times. been years  ? GALLBLADDER SURGERY    ? LAPAROSCOPIC GASTRIC BANDING    ? TRACHEAL STENOSIS REPAIR W/ PERFUSION AND MLB    ? ? ?FAMILY HISTORY: ?Family History  ?Problem Relation Age of Onset  ? Heart disease Mother   ? Heart disease Father   ? Stroke Son   ? Diabetes Maternal Aunt   ? Colon  cancer Neg Hx   ? Esophageal cancer Neg Hx   ? Breast cancer Neg Hx   ? Rectal cancer Neg Hx   ? Stomach cancer Neg Hx   ? ? ?SOCIAL HISTORY: ?Social History  ? ?Socioeconomic History  ? Marital status: Married  ?  Spouse name: Marcello Moores  ? Number of children: 1  ? Years of education: 1-College  ? Highest education level: Not on file  ?Occupational History  ? Occupation: retired  ?Tobacco Use  ? Smoking status: Never  ? Smokeless tobacco: Never  ?Vaping Use  ? Vaping Use: Never used  ?Substance and Sexual Activity  ? Alcohol use: Yes  ?  Alcohol/week: 0.0 standard drinks  ?  Comment: rarely (1 every 2 months)  ? Drug use: No  ? Sexual activity: Never  ?Other Topics Concern  ? Not on file  ?Social History Narrative  ? Lives at home with husband  ? Patient is right handed.  ? Patient drinks caffeine occasionally.  ? ?Social Determinants of Health  ? ?Financial Resource Strain: Not on file  ?Food Insecurity: Not on file  ?Transportation Needs: Not on file  ?Physical Activity: Not on file  ?Stress: Not on file  ?Social Connections: Not on file  ?Intimate Partner Violence: Not on file  ? ? ?PHYSICAL EXAM ? ?Vitals:  ? 06/05/21 1500  ?BP: (!) 162/77  ?Pulse: 81  ?Weight: 191 lb (86.6 kg)  ?Height: '5\' 2"'$  (1.575 m)  ? ?Body mass index is 34.93 kg/m?. ? ?Generalized: Well developed, in no acute distress  ?Neurological examination  ?Mentation: Alert oriented to time, place, history taking. Follows all commands speech and language fluent ?Cranial nerve II-XII: Pupils were equal round reactive to light. Extraocular movements were full, visual field  were full on confrontational test. Facial sensation and strength were normal.  Head turning and shoulder shrug  were normal and symmetric.  Head tremor is noted. ?Motor: The motor testing reveals 5 over

## 2021-06-19 DIAGNOSIS — R739 Hyperglycemia, unspecified: Secondary | ICD-10-CM | POA: Diagnosis not present

## 2021-06-19 DIAGNOSIS — E876 Hypokalemia: Secondary | ICD-10-CM | POA: Diagnosis not present

## 2021-06-19 DIAGNOSIS — D509 Iron deficiency anemia, unspecified: Secondary | ICD-10-CM | POA: Diagnosis not present

## 2021-06-26 ENCOUNTER — Other Ambulatory Visit: Payer: Self-pay

## 2021-06-26 NOTE — Progress Notes (Signed)
? ? ? ?06/26/2021 ?Emily Filbert ?[id number] ?[birth year] ? ? ?Chief Complaint: Barrett's esophagus, schedule an EGD  ? ?History of Present Illness: [name]. Buchinger is a 71 year old female with a past medical history of anxiety, depression, obesity, hypertension, hyperlipidemia, CVA, tremor, hypothyroidism, DM II, anemia on Ferrous Sulfate 350m QD, GERD, Barrett's esophagus, gastroparesis and diverticulosis. S/P hiatal hernia surgery[place], removal of lab band, ventral hernia repair complicated by an infected hematoma requiring a laparotomy which resulted in a 3 month hospital stay.   ? ?She presents her office today to schedule a surveillance EGD due to history of Barrett's esophagus. Her most recent EGD by Dr. PHilarie Fredricksonwas 09/27/2018 which identified a 3 cm hiatal hernia, multiple fundic gland and hyperplastic gastric polyps and a moderate amount of food residue was found in the stomach suggestive of gastroparesis.  Her heartburn was fairly well controlled until about 2 months ago she started experiencing heartburn daily.  No dysphagia.  No upper or lower abdominal pain.  She takes Famotidine 40 mg twice daily and Carafate 1 g nightly.  She has intermittent nausea and vomiting with history of gastroparesis.  She last had nausea with the vomiting nonbloody nonbilious emesis on 4/16 and 06/24/2021.  Episodes of nausea and vomiting occur once monthly or less.  She complains of having a decreased appetite.  She has a diminished taste sensation and she sometimes smells malodorous scents unrelated to food items.  For example, an electric cord at home smelled like dog poop.  She reports losing 13 pounds in less than 6 months. No history of COVID.  She is followed by neurologist for tremors. ? ?She reported completing a colonoscopy in the past prior to her 2007 Lap-Band surgery.  Due to her extensive complications following her Lap-Band surgery, she has avoided any  further invasive colonoscopy procedures and is followed by Cologuard testing.  Her most recent Cologuard test was 04/02/2020 and was negative.  She typically passes a formed to soft brown bowel movement daily.  She has intermittent loose stools with fecal incontinence.  No rectal bleeding. ? ?Laboratory studies 06/21/2011: Glucose 125.  BUN 14.  Creatinine 0.91.  Sodium 142.  Potassium 3.9.  Albumin 4.2.  Total bili 0.3.  Alk phos 119.  AST 14.  ALT 7.  Iron 53.  Hemoglobin A1c 6.4 ? ?EGD 09/27/2018 ?- Z-line irregular, 33 cm from the incisors. Biopsied. ?- Recurrent 3 cm hiatal hernia with patulous GE junction. ?- Multiple gastric polyps. Biopsied. ?- A medium amount of food (residue) in the stomach suggestive of gastroparesis. ?- Normal examined duodenum. ?- 3 year recall EGD ?1. Surgical [P], gastric polyp BX ?- FUNDIC GLAND POLYP ?- HYPERPLASTIC GASTRIC POLYP ?- NO H. PYLORI, INTESTINAL METAPLASIA OR MALIGNANCY IDENTIFIED ?2. Surgical [P], irregular Z line ?- BARRETT ESOPHAGUS ?- NO DYSPLASIA OR MALIGNANCY IDENTIFIED ? ?Past Medical History:  ?Diagnosis Date  ? Anemia   ? Barrett's esophagus 09/30/2018  ? EGD July 2020 - short segment, no dysplasia (repeat EGD July 2023)  ? Cerebrovascular disease   ? Depression with anxiety   ? [place]   ? Diverticulosis   ? Dyslipidemia   ? Fundic gland polyps of stomach, benign   ? GERD (gastroesophageal reflux disease)   ? Hepatomegaly   ? Hiatal hernia   ? Hyperlipidemia   ? Hypertension   ? Hypothyroidism   ? Obesity   ? Obesity   ?  Osteoarthritis   ? Pre-diabetes   ? Tremor   ? Urinary incontinence   ? Ventral hernia   ? Vitamin D deficiency   ?.med ?Current Outpatient Medications on File Prior to Visit  ?Medication Sig Dispense Refill  ? ALPRAZolam (XANAX) 0.5 MG tablet Take 1 tablet (0.5 mg total) by mouth 2 (two) times daily as needed for anxiety. 60 tablet 1  ? aspirin 81 MG tablet Take 81 mg by mouth daily.    ? Aspirin Buf,CaCarb-MgCarb-MgO, 81 MG TABS Take by  mouth.    ? bumetanide (BUMEX) 1 MG tablet 1 tablet daily.    ? diphenhydrAMINE HCl (ALLERGY MED PO) Take 1 tablet by mouth daily as needed.    ? DULoxetine (CYMBALTA) 60 MG capsule 1 capsule daily.    ? famotidine (PEPCID) 40 MG tablet Take 1 tablet (40 mg total) by mouth every 12 (twelve) hours. 180 tablet 3  ? Ferrous Sulfate (IRON) 325 (65 Fe) MG TABS 325 mg daily.    ? levothyroxine (SYNTHROID, LEVOTHROID) 50 MCG tablet TAKE 1 TABLET DAILY BEFORE BREAKFAST. 90 tablet 1  ? Meclizine HCl 25 MG CHEW Chew by mouth.    ? Methylsulfonylmethane (MSM) 1500 MG TABS Take 1 tablet by mouth 2 (two) times daily.    ? modafinil (PROVIGIL) 200 MG tablet 1 tablet daily.    ? PAPAYA ENZYME PO Take by mouth. After meals    ? potassium chloride (MICRO-K) 10 MEQ CR capsule Take 10 mEq by mouth daily.    ? pravastatin (PRAVACHOL) 40 MG tablet TAKE 1 TABLET AT BEDTIME FOR CHOLESTEROL (Patient taking differently: Take 20 mg by mouth daily.) 90 tablet 1  ? PROBIOTIC CAPS Take 2 capsules by mouth daily.     ? sucralfate (CARAFATE) 1 g tablet Take 1 tablet (1 g total) by mouth at bedtime. Do not take within 2 hours of other medications. 90 tablet 3  ? UNABLE TO FIND Take 1 capsule by mouth daily. Med Name: K-2 150 mcg 1x daily ?Probiotic 100 mil 1x ?MSM 1500 mg 2x ?B12 5000 mcg 1x ?Papaya Enzymes 3 after meals    ? Vitamin D, Cholecalciferol, 50 MCG (2000 UT) CAPS Take 1 capsule by mouth daily.    ? ?No current facility-administered medications on file prior to visit.  ? ?Allergies  ?Allergen Reactions  ? Cefazolin Anaphylaxis  ? Ace Inhibitors Cough  ? Enalapril Cough  ? Quinapril   ? Trazodone And Nefazodone   ? Ivp Dye [Iodinated Contrast Media]   ? Penicillin G Rash  ? Penicillins   ? ? ?Current Medications, Allergies, Past Medical History, Past Surgical History, Family History and Social History were reviewed in [employer] record. ? ? ?Review of Systems:   ?Constitutional: Negative for fever, sweats, chills  or weight loss.  ?Respiratory: Negative for shortness of breath.   ?Cardiovascular: Negative for chest pain, palpitations and leg swelling.  ?Gastrointestinal: See HPI.  ?Musculoskeletal: Negative for back pain or muscle aches.  ?Neurological: Negative for dizziness, headaches or paresthesias.  ? ? ?Physical Exam: ?BP 126/90   Pulse 75   Ht _0  (1.575 m)   Wt 187 lb (84.8 kg)   BMI 34.20 kg/m?  ? ?Wt Readings from Last 3 Encounters:  ?06/27/21 187 lb (84.8 kg)  ?06/05/21 191 lb (86.6 kg)  ?12/12/20 200 lb (90.7 kg)  ?  ?General: 71 year old female in no acute distress. ?Head: Normocephalic and atraumatic. ?Eyes: No scleral icterus. Conjunctiva pink . ?Ears: Normal  auditory acuity. ?Mouth: Dentition intact. No ulcers or lesions.  ?Lungs: Clear throughout to auscultation. ?Heart: Regular rate and rhythm, no murmur. ?Abdomen: Soft, nontender and nondistended. No masses or hepatomegaly. Normal bowel sounds x 4 quadrants. Extensive midline abdominal scars.  ?Rectal: Deferred.  ?Musculoskeletal: Symmetrical with no gross deformities. ?Extremities: No edema. ?Neurological: Alert oriented x 4. No focal deficits.  ?Psychological: Alert and cooperative. Normal mood and affect ? ?Assessment and Recommendations: ? ?73) 71 year old female with a history of GERD and Barrett's esophagus ?-EGD benefits and risks discussed including risk with sedation, risk of bleeding, perforation and infection  ?-Continue Famotidine twice daily and Carafate 1 g nightly, patient did not wish to start low-dose PPI ?-Further recommendation to be determined after EGD completed ? ?2) Gastroparesis ?-Recommended 3-4 small snacks sized meals daily ?-EGD as ordered above ?-Patient will contact office N/V symptoms worsen  ? ?3)  Atypical sense of smell, nonfood objects with malodorous smells  ?-I recommended for the patient to follow-up with her PCP and neurologist regarding her altered sense of smell.  Consider ENT evaluation. ? ?4) Past anemia,  likely due to CKD.  On Ferrous Sulfate 325 mg daily.  Normal iron level of 53 on 06/19/2021. ?-Requested most recent CBC from PCP ? ?5) Colon cancer screening. Negative Cologuard 03/2020.  ?-Cologuard due 03/2023 ?

## 2021-06-27 ENCOUNTER — Ambulatory Visit: Payer: Medicare HMO | Admitting: Nurse Practitioner

## 2021-06-27 ENCOUNTER — Encounter: Payer: Self-pay | Admitting: Nurse Practitioner

## 2021-06-27 ENCOUNTER — Other Ambulatory Visit: Payer: Self-pay | Admitting: Internal Medicine

## 2021-06-27 VITALS — BP 126/90 | HR 75 | Ht 62.0 in | Wt 187.0 lb

## 2021-06-27 DIAGNOSIS — K2270 Barrett's esophagus without dysplasia: Secondary | ICD-10-CM

## 2021-06-27 NOTE — Patient Instructions (Addendum)
You have been scheduled for a EGD. Please follow the written instructions given to you at your visit today. ?If you use inhalers (even only as needed), please bring them with you on the day of your procedure. ? ?Thank you for trusting me with your gastrointestinal care!   ? ?Noralyn Pick, CRNP ? ? ? ?BMI: ? ?If you are age 72 or older, your body mass index should be between 23-30. Your Body mass index is 34.2 kg/m?[name] Kitchen If this is out of the aforementioned range listed, please consider follow up with your Primary Care Provider. ? ?If you are age 83 or younger, your body mass index should be between 19-25. Your Body mass index is 34.2 kg/m?[name] Kitchen If this is out of the aformentioned range listed, please consider follow up with your Primary Care Provider.  ? ?MY CHART: ? ?The [facility] providers would like to encourage you to use [facility] to communicate with providers for non-urgent requests or questions.  Due to long hold times on the telephone, sending your provider a message by [facility] may be a faster and more efficient way to get a response.  Please allow 48 business hours for a response.  Please remember that this is for non-urgent requests.  ? ?

## 2021-06-28 NOTE — Progress Notes (Signed)
Addendum: Reviewed and agree with assessment and management plan. Husna Krone M, MD  

## 2021-07-01 ENCOUNTER — Encounter: Payer: Self-pay | Admitting: Internal Medicine

## 2021-07-01 ENCOUNTER — Ambulatory Visit (AMBULATORY_SURGERY_CENTER): Payer: Medicare HMO | Admitting: Internal Medicine

## 2021-07-01 VITALS — BP 135/85 | HR 86 | Temp 97.8°F | Resp 17 | Ht 62.0 in | Wt 187.0 lb

## 2021-07-01 DIAGNOSIS — K449 Diaphragmatic hernia without obstruction or gangrene: Secondary | ICD-10-CM | POA: Diagnosis not present

## 2021-07-01 DIAGNOSIS — K317 Polyp of stomach and duodenum: Secondary | ICD-10-CM

## 2021-07-01 DIAGNOSIS — K2270 Barrett's esophagus without dysplasia: Secondary | ICD-10-CM

## 2021-07-01 DIAGNOSIS — K3184 Gastroparesis: Secondary | ICD-10-CM | POA: Diagnosis not present

## 2021-07-01 MED ORDER — SODIUM CHLORIDE 0.9 % IV SOLN: 500.0000 mL | Freq: Once | INTRAVENOUS | Status: DC

## 2021-07-01 NOTE — Patient Instructions (Addendum)
Resume previous medications.  Await results for final recommendations.  Handouts on findings given to patient.    ? ?Use Metoclopramide as needed.  ? ?OTC Guaifenesin for cough/phlegm.  If no improvement see your PCP.  ? ?Dr. Vena Rua office will call you to schedule a f/u appointment for June or July.   ? ?YOU HAD AN ENDOSCOPIC PROCEDURE TODAY AT [facility]:   Refer to the procedure report that was given to you for any specific questions about what was found during the examination.  If the procedure report does not answer your questions, please call your gastroenterologist to clarify.  If you requested that your care partner not be given the details of your procedure findings, then the procedure report has been included in a sealed envelope for you to review at your convenience later. ? ?YOU SHOULD EXPECT: Some feelings of bloating in the abdomen. Passage of more gas than usual.  Walking can help get rid of the air that was put into your GI tract during the procedure and reduce the bloating. If you had a lower endoscopy (such as a colonoscopy or flexible sigmoidoscopy) you may notice spotting of blood in your stool or on the toilet paper. If you underwent a bowel prep for your procedure, you may not have a normal bowel movement for a few days. ? ?Please Note:  You might notice some irritation and congestion in your nose or some drainage.  This is from the oxygen used during your procedure.  There is no need for concern and it should clear up in a day or so. ? ?SYMPTOMS TO REPORT IMMEDIATELY: ? ?Following upper endoscopy (EGD) ? Vomiting of blood or coffee ground material ? New chest pain or pain under the shoulder blades ? Painful or persistently difficult swallowing ? New shortness of breath ? Fever of 100?F or higher ? Black, tarry-looking stools ? ?For urgent or emergent issues, a gastroenterologist can be reached at any hour by calling [phone]. ?Do not use MyChart messaging for urgent  concerns.  ? ? ?DIET:  We do recommend a small meal at first, but then you may proceed to your regular diet.  Drink plenty of fluids but you should avoid alcoholic beverages for 24 hours. ? ?ACTIVITY:  You should plan to take it easy for the rest of today and you should NOT DRIVE or use heavy machinery until tomorrow (because of the sedation medicines used during the test).   ? ?FOLLOW UP: ?Our staff will call the number listed on your records 48-72 hours following your procedure to check on you and address any questions or concerns that you may have regarding the information given to you following your procedure. If we do not reach you, we will leave a message.  We will attempt to reach you two times.  During this call, we will ask if you have developed any symptoms of COVID 19. If you develop any symptoms (ie: fever, flu-like symptoms, shortness of breath, cough etc.) before then, please call [phone].  If you test positive for Covid 19 in the 2 weeks post procedure, please call and report this information to [place].   ? ?If any biopsies were taken you will be contacted by phone or by letter within the next 1-3 weeks.  Please call us at [phone] if you have not heard about the biopsies in 3 weeks.  ? ? ?SIGNATURES/CONFIDENTIALITY: ?You and/or your care partner have signed paperwork which will be entered into your  electronic medical record.  These signatures attest to the fact that that the information above on your After Visit Summary has been reviewed and is understood.  Full responsibility of the confidentiality of this discharge information lies with you and/or your care-partner.  ?

## 2021-07-01 NOTE — Progress Notes (Signed)
Pt's states no medical or surgical changes since previsit or office visit. 

## 2021-07-01 NOTE — Progress Notes (Signed)
Pt non-responsive, VVS, Report to RN  °

## 2021-07-01 NOTE — Progress Notes (Signed)
See office note dated 06/27/2021 for details.  Patient was seen by Carl Best, NP on this day. ?She presents today for EGD for surveillance of known Barrett's esophagus without dysplasia. ? ?She remains appropriate for EGD in the [place] today ?

## 2021-07-01 NOTE — Progress Notes (Signed)
Called to room to assist during endoscopic procedure.  Patient ID and intended procedure confirmed with present staff. Received instructions for my participation in the procedure from the performing physician.  

## 2021-07-01 NOTE — Op Note (Signed)
[facility] ?Patient Name: [name] ?Procedure Date: 07/01/2021 1:29 PM ?MRN: [id number] ?Endoscopist: Jerene Bears , MD ?Age: 71 ?Referring MD:  ?Date of Birth: 10-01-1950 ?Gender: Female ?Account #: [id number] ?Procedure:                Upper GI endoscopy ?Indications:              Follow-up of Barrett's esophagus ?Medicines:                Monitored Anesthesia Care ?Procedure:                Pre-Anesthesia Assessment: ?                          - Prior to the procedure, a History and Physical  ?                          was performed, and patient medications and  ?                          allergies were reviewed. The patient's tolerance of  ?                          previous anesthesia was also reviewed. The risks  ?                          and benefits of the procedure and the sedation  ?                          options and risks were discussed with the patient.  ?                          All questions were answered, and informed consent  ?                          was obtained. Prior Anticoagulants: The patient has  ?                          taken no previous anticoagulant or antiplatelet  ?                          agents. ASA Grade Assessment: III - A patient with  ?                          severe systemic disease. After reviewing the risks  ?                          and benefits, the patient was deemed in  ?                          satisfactory condition to undergo the procedure. ?                          After obtaining informed consent, the endoscope was  ?  passed under direct vision. Throughout the  ?                          procedure, the patient's blood pressure, pulse, and  ?                          oxygen saturations were monitored continuously. The  ?                          Endoscope was introduced through the mouth, and  ?                          advanced to the second part of duodenum. The upper  ?                          GI endoscopy was  accomplished without difficulty.  ?                          The patient tolerated the procedure well. ?Scope In: ?Scope Out: ?Findings:                 The esophagus and gastroesophageal junction were  ?                          examined with white light and narrow band imaging  ?                          (NBI) from a forward view and retroflexed position.  ?                          There were esophageal mucosal changes consistent  ?                          with short-segment Barrett's esophagus. These  ?                          changes involved the mucosa at the upper extent of  ?                          the gastric folds (34 cm from the incisors)  ?                          extending to the Z-line (33 cm from the incisors).  ?                          Z-line irregular and one tongue of salmon-colored  ?                          mucosa was present from 33 to 34 cm. The maximum  ?                          longitudinal extent of these esophageal mucosal  ?  changes was 1-1.5 cm in length. Mucosa was biopsied  ?                          with a cold forceps for histology in a targeted  ?                          manner at intervals of 0.5 cm from 33 to 34 cm from  ?                          the incisors. One specimen bottle was sent to  ?                          pathology. ?                          A 3 cm hiatal hernia was present. ?                          A few diminutive sessile polyps were found in the  ?                          gastric fundus and in the gastric body. ?                          The exam of the stomach was otherwise normal. ?                          The examined duodenum was normal. ?Complications:            No immediate complications. ?Estimated Blood Loss:     Estimated blood loss was minimal. ?Impression:               - Esophageal mucosal changes consistent with  ?                          short-segment Barrett's esophagus. Biopsied. ?                           - 3 cm hiatal hernia. ?                          - A few gastric polyps. Benign-appearing without  ?                          significant change (compared to 2020). ?                          - Normal examined duodenum. ?Recommendation:           - Patient has a contact number available for  ?                          emergencies. The signs and symptoms of potential  ?  delayed complications were discussed with the  ?                          patient. Return to normal activities tomorrow.  ?                          Written discharge instructions were provided to the  ?                          patient. ?                          - Resume previous diet. ?                          - Continue present medications including famotidine  ?                          40 mg twice daily. ?                          - Await pathology results. ?                          - Office followup if persistent trouble GI symptoms  ?                          including nausea or vomiting. ?Jerene Bears, MD ?07/01/2021 2:08:25 PM ?This report has been signed electronically. ?

## 2021-07-03 ENCOUNTER — Telehealth: Payer: Self-pay

## 2021-07-03 NOTE — Telephone Encounter (Signed)
?  Follow up Call- ? ? ?  07/01/2021  ? 12:53 PM  ?Call back number  ?Post procedure Call Back phone  # [phone]  ?Permission to leave phone message Yes  ?  ? ?Patient questions: ? ?Do you have a fever, pain , or abdominal swelling? No. ?Pain Score  0 * ? ?Have you tolerated food without any problems? Yes.   ? ?Have you been able to return to your normal activities? Yes.   ? ?Do you have any questions about your discharge instructions: ?Diet   No. ?Medications  No. ?Follow up visit  No. ? ?Do you have questions or concerns about your Care? No. ? ?Actions: ?* If pain score is 4 or above: ?No action needed, pain <4. ? ?Pt said Dr. Hilarie Fredrickson did not mention gastroparesis  in her report.  I went over the report with the pt.  And she asked how long it will take to get the bx results.  I told pt can take 1-3 weeks.  To call us back if she has not received results by 3 weeks. ? ? ?

## 2021-07-10 ENCOUNTER — Encounter: Payer: Self-pay | Admitting: Internal Medicine

## 2021-07-17 DIAGNOSIS — I10 Essential (primary) hypertension: Secondary | ICD-10-CM | POA: Diagnosis not present

## 2021-07-17 DIAGNOSIS — R5383 Other fatigue: Secondary | ICD-10-CM | POA: Diagnosis not present

## 2021-07-17 DIAGNOSIS — Z299 Encounter for prophylactic measures, unspecified: Secondary | ICD-10-CM | POA: Diagnosis not present

## 2021-07-17 DIAGNOSIS — Z789 Other specified health status: Secondary | ICD-10-CM | POA: Diagnosis not present

## 2021-07-17 DIAGNOSIS — R0602 Shortness of breath: Secondary | ICD-10-CM | POA: Diagnosis not present

## 2021-07-31 DIAGNOSIS — Z299 Encounter for prophylactic measures, unspecified: Secondary | ICD-10-CM | POA: Diagnosis not present

## 2021-07-31 DIAGNOSIS — Z6834 Body mass index (BMI) 34.0-34.9, adult: Secondary | ICD-10-CM | POA: Diagnosis not present

## 2021-07-31 DIAGNOSIS — J310 Chronic rhinitis: Secondary | ICD-10-CM | POA: Diagnosis not present

## 2021-08-20 ENCOUNTER — Ambulatory Visit: Payer: Medicare HMO | Admitting: Neurology

## 2021-09-17 ENCOUNTER — Ambulatory Visit: Payer: Medicare HMO | Admitting: Internal Medicine

## 2021-09-17 ENCOUNTER — Encounter: Payer: Self-pay | Admitting: Internal Medicine

## 2021-09-17 VITALS — BP 164/96 | HR 80 | Ht 62.0 in | Wt 185.2 lb

## 2021-09-17 DIAGNOSIS — R112 Nausea with vomiting, unspecified: Secondary | ICD-10-CM

## 2021-09-17 DIAGNOSIS — K219 Gastro-esophageal reflux disease without esophagitis: Secondary | ICD-10-CM

## 2021-09-17 DIAGNOSIS — K2270 Barrett's esophagus without dysplasia: Secondary | ICD-10-CM | POA: Diagnosis not present

## 2021-09-17 DIAGNOSIS — K3184 Gastroparesis: Secondary | ICD-10-CM

## 2021-09-17 MED ORDER — ONDANSETRON HCL 4 MG PO TABS: 4.0000 mg | ORAL_TABLET | Freq: Three times a day (TID) | ORAL | 1 refills | Status: DC | PRN

## 2021-09-17 NOTE — Patient Instructions (Addendum)
If you are age 72 or older, your body mass index should be between 23-30. Your Body mass index is 33.88 kg/m. If this is out of the aforementioned range listed, please consider follow up with your Primary Care Provider.  If you are age 18 or younger, your body mass index should be between 19-25. Your Body mass index is 33.88 kg/m. If this is out of the aformentioned range listed, please consider follow up with your Primary Care Provider.   Continue Famotidine 40 mg every 12 hour daily.  Continue Carafate slurry 1g at bedtime as needed.   We have sent the following medications to your pharmacy for you to pick up at your convenience: Zofran 4 mg every 6-8 hours as needed.  The [facility] providers would like to encourage you to use [facility] to communicate with providers for non-urgent requests or questions.  Due to long hold times on the telephone, sending your provider a message by [facility] may be a faster and more efficient way to get a response.  Please allow 48 business hours for a response.  Please remember that this is for non-urgent requests.   It was a pleasure to see you today!  Thank you for trusting me with your gastrointestinal care!    Zenovia Jarred, MD

## 2021-09-18 ENCOUNTER — Encounter: Payer: Self-pay | Admitting: Internal Medicine

## 2021-09-18 NOTE — Progress Notes (Signed)
Subjective:    Patient ID: [name], female    DOB: 12-Aug-1950, 71 y.o.   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  She is here alone today.  I performed her surveillance upper endoscopy on 07/01/2021.  This revealed short segment Barrett's esophagus which was biopsied and negative for dysplasia.  There was a 3 cm recurrent hiatal hernia.  A few diminutive gastric polyps which had the typical appearance for fundic gland polyps.  Stomach was otherwise normal as was examined duodenum.  She reports she has continued famotidine 40 mg twice daily.  She uses sucralfate slurry at night.  She still has burning pyrosis symptoms on a regular basis.  She has been hesitant to reinstitute PPI over concern of side effects.  She has been using liquid aloe vera which she has found helpful and led to significant relief of her pyrosis symptom.  She is not having dysphagia.  She does have intermittent nausea and early fullness.  Occasionally she will vomit.  This happened recently after eating one half slices of pizza.  Her weight has been stable.  She has not seen any blood in stool or melena.  She was anemic though started on oral iron and has follow-up labs with primary care very soon.  She had previously felt very tired but this has resolved and she is feeling more normal.  She has had a negative Cologuard in January 2022   Review of Systems As per HPI, otherwise negative  Current Medications, Allergies, Past Medical History, Past Surgical History, Family History and Social History were reviewed in [employer] record.      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    GERD/Barrett's esophagus --she still having some pyrosis despite high-dose famotidine.  We discussed PPI but she does not wish to reinstitute PPI therapy.  Carafate seems to help some when used for burning chest pain.  She is using aloe vera with some relief at this point --Continue famotidine 40 mg twice daily; we can reconsider PPI if she wishes to do so in the future --Continue sucralfate 1 g nightly as needed; on a limited and sparing basis she can use this once during the day if needed  2.  Gastroparesis and intermittent nausea --nausea is an issue we could try some ondansetron.  I recommended gastroparesis diet which we discussed today --Gastroparesis diet --Ondansetron 4 mg every 6-8 hours as needed  3.  Past anemia/CKD --no melena or overt bleeding.  If definitively iron deficient colonoscopy would be recommended.  Primary care is following blood counts and iron stores  4.  Colon cancer screening --negative Cologuard in 2022; repeat in 2025 unless colonoscopy needed sooner  3-monthfollow-up, sooner if needed  30 minutes total spent today including patient facing time, coordination of care, reviewing medical history/procedures/pertinent radiology studies, and documentation of the encounter.

## 2021-09-27 ENCOUNTER — Other Ambulatory Visit: Payer: Self-pay | Admitting: Nurse Practitioner

## 2021-10-01 DIAGNOSIS — E1165 Type 2 diabetes mellitus with hyperglycemia: Secondary | ICD-10-CM | POA: Diagnosis not present

## 2021-10-01 DIAGNOSIS — Z713 Dietary counseling and surveillance: Secondary | ICD-10-CM | POA: Diagnosis not present

## 2021-10-01 DIAGNOSIS — Z299 Encounter for prophylactic measures, unspecified: Secondary | ICD-10-CM | POA: Diagnosis not present

## 2021-10-01 DIAGNOSIS — R739 Hyperglycemia, unspecified: Secondary | ICD-10-CM | POA: Diagnosis not present

## 2021-10-01 DIAGNOSIS — D509 Iron deficiency anemia, unspecified: Secondary | ICD-10-CM | POA: Diagnosis not present

## 2021-10-01 DIAGNOSIS — I10 Essential (primary) hypertension: Secondary | ICD-10-CM | POA: Diagnosis not present

## 2021-10-03 DIAGNOSIS — E7800 Pure hypercholesterolemia, unspecified: Secondary | ICD-10-CM | POA: Diagnosis not present

## 2021-10-03 DIAGNOSIS — H5200 Hypermetropia, unspecified eye: Secondary | ICD-10-CM | POA: Diagnosis not present

## 2021-10-03 DIAGNOSIS — Z0100 Encounter for examination of eyes and vision without abnormal findings: Secondary | ICD-10-CM | POA: Diagnosis not present

## 2021-12-04 ENCOUNTER — Ambulatory Visit: Payer: Medicare HMO | Admitting: Neurology

## 2021-12-04 VITALS — BP 150/94 | HR 76 | Ht 61.0 in | Wt 185.5 lb

## 2021-12-04 DIAGNOSIS — G250 Essential tremor: Secondary | ICD-10-CM

## 2021-12-04 DIAGNOSIS — R490 Dysphonia: Secondary | ICD-10-CM | POA: Diagnosis not present

## 2021-12-04 DIAGNOSIS — R251 Tremor, unspecified: Secondary | ICD-10-CM

## 2021-12-04 MED ORDER — ALPRAZOLAM 0.5 MG PO TABS: 0.5000 mg | ORAL_TABLET | Freq: Two times a day (BID) | ORAL | 0 refills | Status: DC | PRN

## 2021-12-04 NOTE — Progress Notes (Signed)
[facility]  Provider:  Dr Juandaniel [name] Referring Provider: Glenda Chroman, MD Primary Care Physician:  Ariana Chroman, MD  Chief Complaint  Patient presents with   Follow-up    Pt in room #11  [name] , and husband in the lobby. Pt here today for tremor, see new MD .     HPI:  [name] is a 71 y.o. female and seen here upon referral from Ariana. Woody [name] for a Consultation/ Evaluation of essential tremor;  History of present illness:  [name] is a 71 year old right-handed white female with a history of essential tremor.  The patient indicates that she was about 71 years old when she first started noticing a tremor.  The tremor has gradually worsened as she has gotten older.  Her brother now has a tremor, and there are several first cousins on her father side of the family with tremor.  The tremor has affected head and neck and both arms, she has noted difficulty with handwriting and with using utensils such as knives.  She does not cook for fear of spilling something and burning herself.  The patient has been on gabapentin and primidone in the past, she has been evaluated for possible deep brain stimulator but was not felt to be an adequate candidate secondary to depression.  The patient has done fairly well with low-dose alprazolam, she will take 0.5 mg intermittently for the tremor when she needs it.  She does have good days and bad days with tremor.  The patient reports some problems with gastroparesis, she has some fecal and urinary incontinence issues and a history of a right hemidiaphragm problem.  She denies any falls or any significant gait instability.  She has no weakness of the extremities but she does note some numbness in the toes at times..  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 Gastroparesis. Chronic nausea. On zofran, on cymbalta ,  on xanax.    Status post failed gastric band surgery in 2007 . Had sepsis within a week of surgery, secondary abdominal wound healing, mash  repair in [place] at [facility] .   Family : 2 brothers out of 3 with essential tremor. Father died at age 78, PGF had tremors.    Review of Systems: Out of a complete 14 system review, the patient complains of only the following symptoms, and all other reviewed systems are negative. 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   Epworth Sleepiness score: n/a   Social History   Socioeconomic History   Marital status: Married    Spouse name: [name]   Number of children: 1   Years of education: 1-College   Highest education level: Not on file  Occupational History   Occupation: retired  Tobacco Use   Smoking status: Never   Smokeless tobacco: Never  Vaping Use   Vaping Use: Never used  Substance and Sexual Activity   Alcohol use: Yes    Alcohol/week: 0.0 standard drinks of alcohol    Comment: rarely (1 every 2 months)   Drug use: No   Sexual activity: Never  Other Topics Concern   Not on file  Social History Narrative   Lives at home with husband   Patient is right handed.   Patient drinks caffeine occasionally.   Social Determinants of Health   Financial Resource Strain:  Not on file  Food Insecurity: Not on file  Transportation Needs: Not on file  Physical Activity: Not on file  Stress: Not on file  Social Connections: Not on file  Intimate Partner Violence: Not on file    Family History  Problem Relation Age of Onset   Heart disease Mother    Heart disease Father    Stroke Son    Diabetes Maternal Aunt    Colon cancer Neg Hx    Esophageal cancer Neg Hx    Breast cancer Neg Hx    Rectal cancer Neg Hx    Stomach cancer Neg Hx     Past Medical History:  Diagnosis Date   Anemia    Barrett's esophagus 09/30/2018   EGD July 2020 - short segment, no  dysplasia (repeat EGD July 2023)   Cerebrovascular disease    Depression with anxiety    Diabetes ([place])    Diverticulosis    Dyslipidemia    Fundic gland polyps of stomach, benign    GERD (gastroesophageal reflux disease)    Hepatomegaly    Hiatal hernia    Hyperlipidemia    Hypertension    Hypothyroidism    Obesity    Obesity    Osteoarthritis    Pre-diabetes    Tremor    Urinary incontinence    Ventral hernia    Vitamin D deficiency     Past Surgical History:  Procedure Laterality Date   ABDOMINAL HYSTERECTOMY     ABDOMINAL WALL MESH  REMOVAL     ABDOMINAL WALL MESH  REMOVAL     APPENDECTOMY     BREAST REDUCTION SURGERY     COLONOSCOPY     At a hospital. Can't remember   ESOPHAGOGASTRODUODENOSCOPY     serveral times. been years   GALLBLADDER SURGERY     LAPAROSCOPIC GASTRIC BANDING     TRACHEAL STENOSIS REPAIR W/ PERFUSION AND MLB      Current Outpatient Medications  Medication Sig Dispense Refill   ALPRAZolam (XANAX) 0.5 MG tablet Take 1 tablet (0.5 mg total) by mouth 2 (two) times daily as needed for anxiety. 60 tablet 1   aspirin 81 MG tablet Take 81 mg by mouth daily.     bumetanide (BUMEX) 1 MG tablet 1 tablet daily.     diphenhydrAMINE HCl (ALLERGY MED PO) Take 1 tablet by mouth daily as needed.     DULoxetine (CYMBALTA) 60 MG capsule 1 capsule daily.     famotidine (PEPCID) 40 MG tablet TAKE 1 TABLET EVERY 12 HOURS 180 tablet 1   Ferrous Sulfate (IRON) 325 (65 Fe) MG TABS 325 mg daily.     levothyroxine (SYNTHROID, LEVOTHROID) 50 MCG tablet TAKE 1 TABLET DAILY BEFORE BREAKFAST. 90 tablet 1   Meclizine HCl 25 MG CHEW Chew by mouth.     Methylsulfonylmethane (MSM) 1500 MG TABS Take 1 tablet by mouth 2 (two) times daily.     modafinil (PROVIGIL) 200 MG tablet 1 tablet daily.     ondansetron (ZOFRAN) 4 MG tablet Take 1 tablet (4 mg total) by mouth every 8 (eight) hours as needed for nausea or vomiting. 30 tablet 1   PAPAYA ENZYME PO Take by mouth. After meals      potassium chloride (MICRO-K) 10 MEQ CR capsule Take 10 mEq by mouth daily.     pravastatin (PRAVACHOL) 40 MG tablet TAKE 1 TABLET AT BEDTIME FOR CHOLESTEROL (Patient taking differently: Take 40 mg by mouth daily.) 90 tablet 1   PROBIOTIC  CAPS Take 2 capsules by mouth daily.      sucralfate (CARAFATE) 1 g tablet Take 1 tablet (1 g total) by mouth at bedtime. Do not take within 2 hours of other medications. 90 tablet 3   Vitamin D, Cholecalciferol, 50 MCG (2000 UT) CAPS Take 1 capsule by mouth daily.     No current facility-administered medications for this visit.    Allergies as of 12/04/2021 - Review Complete 12/04/2021  Allergen Reaction Noted   Cefazolin Anaphylaxis 03/25/2012   Ace inhibitors Cough 04/22/2018   Enalapril Cough 03/01/2013   Quinapril  03/01/2013   Trazodone and nefazodone  03/01/2013   Ivp dye [iodinated contrast media]  03/25/2012   Penicillin g Rash 04/22/2018   Penicillins  03/25/2012    Vitals: BP (!) 150/94 (BP Location: Left Arm, Patient Position: Sitting, Cuff Size: Normal)   Pulse 76   Ht '5\' 1"'$  (1.549 m)   Wt 185 lb 8 oz (84.1 kg)   BMI 35.05 kg/m  Last Weight:  Wt Readings from Last 1 Encounters:  12/04/21 185 lb 8 oz (84.1 kg)   Last Height:   Ht Readings from Last 1 Encounters:  12/04/21 '5\' 1"'$  (1.549 m)   Last BMI: '@LASTBMI'$  Physical exam:  General: The patient is awake, alert and appears not in acute distress.  The patient is well groomed. Head: Normocephalic, atraumatic.   Neck is supple.   Neck circumference:15" Cardiovascular:  Regular rate and palpable peripheral pulse:  Respiratory: clear to auscultation.  Mallampati: , Skin:  Without evidence of edema, or rash Trunk:  normal posture.   Neurologic exam : The patient is awake and alert, oriented to place and time.   Memory subjective  described as intact.  There is a normal attention span & concentration ability.  Speech is fluent with  dysphonia or aphasia.  Mood and  affect are appropriate.  Cranial nerves: Pupils are equal and briskly reactive to light. Funduscopic exam without  evidence of pallor or edema. Extraocular movements  in vertical and horizontal planes intact and without nystagmus. Visual fields by finger perimetry are intact. Hearing to finger rub intact.  Facial sensation intact to fine touch. Facial motor strength is symmetric and tongue and uvula move midline.  Motor exam:   Normal tone and normal muscle bulk and symmetric normal strength in all extremities. Grip Strength is intact  Proximal strength of shoulder muscles and hip flexors was full. .  Sensory:  Fine touch and vibration were tested . Proprioception was tested in the upper extremities only and was  normal.  Coordination: Rapid alternating movements in the fingers/hands were normal.  Finger-to-nose maneuver was affected by action tremor.  Gait and station: Patient walked without assistive device . Core Strength within normal limits. Stance is stable and of normal base.  Deep tendon reflexes: in the  upper and lower extremities are symmetric and    Assessment: Total time for face to face interview and examination, for review of  images and laboratory testing, neurophysiology testing and pre-existing records, including out-of -network , was 35 minutes. Assessment is as follows here:  1)  essential , familiar tremor with almost lifelong slow progression.  2) primidone failed, Topamax failed, Amitriptyline failed, resumed xanax.    Plan:  Treatment plan and additional workup planned after today includes:   1)  CALA trio device discussed and measured her dominant right wrist.  2)  small size CALA device ordered.   RV in 6 months with NP sarah,slack.  Refill Xanax as needed.    Larey Seat, MD  12-04-2021

## 2021-12-12 DIAGNOSIS — E785 Hyperlipidemia, unspecified: Secondary | ICD-10-CM | POA: Diagnosis not present

## 2021-12-12 DIAGNOSIS — Z299 Encounter for prophylactic measures, unspecified: Secondary | ICD-10-CM | POA: Diagnosis not present

## 2021-12-12 DIAGNOSIS — E039 Hypothyroidism, unspecified: Secondary | ICD-10-CM | POA: Diagnosis not present

## 2021-12-12 DIAGNOSIS — F339 Major depressive disorder, recurrent, unspecified: Secondary | ICD-10-CM | POA: Diagnosis not present

## 2022-01-01 ENCOUNTER — Telehealth: Payer: Self-pay | Admitting: Neurology

## 2022-01-01 NOTE — Telephone Encounter (Signed)
Called the patient back. Advised this form was completed for the patient and faxed over to [facility]. Received confirmation 12/04/21 that the fax went through. Provided her with the [facility] customer care number for her to attempt reaching out. Advised her to have them contact us if there is anything else needed.  Instructed the to call back with any questions or concerns.

## 2022-01-01 NOTE — Telephone Encounter (Signed)
Pt states Dr Brett Fairy took her wrist measurements but she has not heard from anyone re: the wrist band for her tremors.  Please call with an update

## 2022-01-08 DIAGNOSIS — Z299 Encounter for prophylactic measures, unspecified: Secondary | ICD-10-CM | POA: Diagnosis not present

## 2022-01-08 DIAGNOSIS — Z713 Dietary counseling and surveillance: Secondary | ICD-10-CM | POA: Diagnosis not present

## 2022-01-08 DIAGNOSIS — R739 Hyperglycemia, unspecified: Secondary | ICD-10-CM | POA: Diagnosis not present

## 2022-01-08 DIAGNOSIS — Z0000 Encounter for general adult medical examination without abnormal findings: Secondary | ICD-10-CM | POA: Diagnosis not present

## 2022-01-08 DIAGNOSIS — R011 Cardiac murmur, unspecified: Secondary | ICD-10-CM | POA: Diagnosis not present

## 2022-01-08 DIAGNOSIS — I10 Essential (primary) hypertension: Secondary | ICD-10-CM | POA: Diagnosis not present

## 2022-01-13 DIAGNOSIS — R010 Benign and innocent cardiac murmurs: Secondary | ICD-10-CM | POA: Diagnosis not present

## 2022-01-21 DIAGNOSIS — D225 Melanocytic nevi of trunk: Secondary | ICD-10-CM | POA: Diagnosis not present

## 2022-01-21 DIAGNOSIS — L578 Other skin changes due to chronic exposure to nonionizing radiation: Secondary | ICD-10-CM | POA: Diagnosis not present

## 2022-01-21 DIAGNOSIS — Z1283 Encounter for screening for malignant neoplasm of skin: Secondary | ICD-10-CM | POA: Diagnosis not present

## 2022-01-25 ENCOUNTER — Other Ambulatory Visit: Payer: Self-pay | Admitting: Nurse Practitioner

## 2022-02-04 ENCOUNTER — Other Ambulatory Visit: Payer: Self-pay | Admitting: Internal Medicine

## 2022-02-04 DIAGNOSIS — Z1231 Encounter for screening mammogram for malignant neoplasm of breast: Secondary | ICD-10-CM

## 2022-02-11 NOTE — Telephone Encounter (Signed)
Received fax from [facility] asking for Dr. Edwena Felty office note for supporting documentation. I faxed this back to them at [phone]. Received fax confirmation.

## 2022-02-26 ENCOUNTER — Ambulatory Visit
Admission: RE | Admit: 2022-02-26 | Discharge: 2022-02-26 | Disposition: A | Discharge status: Home / Self Care | Payer: Medicare HMO | Source: Ambulatory Visit | Attending: Internal Medicine | Admitting: Internal Medicine

## 2022-02-26 DIAGNOSIS — Z1231 Encounter for screening mammogram for malignant neoplasm of breast: Secondary | ICD-10-CM | POA: Diagnosis not present

## 2022-05-08 ENCOUNTER — Other Ambulatory Visit: Payer: Self-pay | Admitting: Internal Medicine

## 2022-05-27 DIAGNOSIS — R5383 Other fatigue: Secondary | ICD-10-CM | POA: Diagnosis not present

## 2022-05-27 DIAGNOSIS — E7800 Pure hypercholesterolemia, unspecified: Secondary | ICD-10-CM | POA: Diagnosis not present

## 2022-05-27 DIAGNOSIS — D692 Other nonthrombocytopenic purpura: Secondary | ICD-10-CM | POA: Diagnosis not present

## 2022-05-27 DIAGNOSIS — Z79899 Other long term (current) drug therapy: Secondary | ICD-10-CM | POA: Diagnosis not present

## 2022-05-27 DIAGNOSIS — E039 Hypothyroidism, unspecified: Secondary | ICD-10-CM | POA: Diagnosis not present

## 2022-05-27 DIAGNOSIS — Z299 Encounter for prophylactic measures, unspecified: Secondary | ICD-10-CM | POA: Diagnosis not present

## 2022-05-27 DIAGNOSIS — D509 Iron deficiency anemia, unspecified: Secondary | ICD-10-CM | POA: Diagnosis not present

## 2022-05-27 DIAGNOSIS — Z0000 Encounter for general adult medical examination without abnormal findings: Secondary | ICD-10-CM | POA: Diagnosis not present

## 2022-05-27 DIAGNOSIS — I700 Atherosclerosis of aorta: Secondary | ICD-10-CM | POA: Diagnosis not present

## 2022-05-27 DIAGNOSIS — Z1339 Encounter for screening examination for other mental health and behavioral disorders: Secondary | ICD-10-CM | POA: Diagnosis not present

## 2022-05-27 DIAGNOSIS — Z7189 Other specified counseling: Secondary | ICD-10-CM | POA: Diagnosis not present

## 2022-05-27 DIAGNOSIS — R739 Hyperglycemia, unspecified: Secondary | ICD-10-CM | POA: Diagnosis not present

## 2022-05-27 DIAGNOSIS — Z1331 Encounter for screening for depression: Secondary | ICD-10-CM | POA: Diagnosis not present

## 2022-06-04 ENCOUNTER — Encounter: Payer: Self-pay | Admitting: Neurology

## 2022-06-04 ENCOUNTER — Ambulatory Visit: Payer: Medicare HMO | Admitting: Neurology

## 2022-06-04 VITALS — BP 164/111 | HR 78 | Ht 61.0 in | Wt 187.0 lb

## 2022-06-04 DIAGNOSIS — G250 Essential tremor: Secondary | ICD-10-CM

## 2022-06-04 DIAGNOSIS — R490 Dysphonia: Secondary | ICD-10-CM | POA: Diagnosis not present

## 2022-06-04 DIAGNOSIS — R251 Tremor, unspecified: Secondary | ICD-10-CM | POA: Diagnosis not present

## 2022-06-04 DIAGNOSIS — F331 Major depressive disorder, recurrent, moderate: Secondary | ICD-10-CM | POA: Diagnosis not present

## 2022-06-04 MED ORDER — ALPRAZOLAM 0.5 MG PO TABS: 0.5000 mg | ORAL_TABLET | Freq: Two times a day (BID) | ORAL | 0 refills | Status: DC | PRN

## 2022-06-04 NOTE — Patient Instructions (Signed)
Essential Tremor ?A tremor is trembling or shaking that a person cannot control. Most tremors affect the hands or arms. Tremors can also affect the head, vocal cords, legs, and other parts of the body. Essential tremor is a tremor without a known cause. Usually, it occurs while a person is trying to perform an action. It tends to get worse gradually as a person ages. ?What are the causes? ?The cause of this condition is not known, but it often runs in families. ?What increases the risk? ?You are more likely to develop this condition if: ?You have a family member with essential tremor. ?You are 40 years of age or older. ?What are the signs or symptoms? ?The main sign of a tremor is a rhythmic shaking of certain parts of your body that is uncontrolled and unintentional. You may: ?Have difficulty eating with a spoon or fork. ?Have difficulty writing. ?Nod your head up and down or side to side. ?Have a quivering voice. ?The shaking may: ?Get worse over time. ?Come and go. ?Be more noticeable on one side of your body. ?Get worse due to stress, tiredness (fatigue), caffeine, and extreme heat or cold. ?How is this diagnosed? ?This condition may be diagnosed based on: ?Your symptoms and medical history. ?A physical exam. ?There is no single test to diagnose an essential tremor. However, your health care provider may order tests to rule out other causes of your condition. These may include: ?Blood and urine tests. ?Imaging studies of your brain, such as a CT scan or MRI. ?How is this treated? ?Treatment for essential tremor depends on the severity of the condition. ?Mild tremors may not need treatment if they do not affect your day-to-day life. ?Severe tremors may need to be treated using one or more of the following options: ?Medicines. ?Injections of a substance called botulinum toxin. ?Procedures such as deep brain stimulation (DBS) implantation or MRI-guided ultrasound treatment. ?Lifestyle changes. ?Occupational or  physical therapy. ?Follow these instructions at home: ?Lifestyle ? ?Do not use any products that contain nicotine or tobacco. These products include cigarettes, chewing tobacco, and vaping devices, such as e-cigarettes. If you need help quitting, ask your health care provider. ?Limit your caffeine intake as told by your health care provider. ?Try to get 8 hours of sleep each night. ?Find ways to manage your stress that fit your lifestyle and personality. Consider trying meditation or yoga. ?Try to anticipate stressful situations and allow extra time to manage them. ?If you are struggling emotionally with the effects of your tremor, consider working with a mental health provider. ?General instructions ?Take over-the-counter and prescription medicines only as told by your health care provider. ?Avoid extreme heat and extreme cold. ?Keep all follow-up visits. This is important. Visits may include physical therapy visits. ?Where to find more information ?National Institute of Neurological Disorders and Stroke: www.ninds.nih.gov ?Contact a health care provider if: ?You experience any changes in the location or intensity of your tremors. ?You start having a tremor after starting a new medicine. ?You have a tremor with other symptoms, such as: ?Numbness. ?Tingling. ?Pain. ?Weakness. ?Your tremor gets worse. ?Your tremor interferes with your daily life. ?You feel down, blue, or sad for at least 2 weeks in a row. ?Worrying about your tremor and what other people think about you interferes with your everyday life functions, including relationships, work, or school. ?Summary ?Essential tremor is a tremor without a known cause. Usually, it occurs when you are trying to perform an action. ?You are more likely   to develop this condition if you have a family member with essential tremor. ?The main sign of a tremor is a rhythmic shaking of certain parts of your body that is uncontrolled and unintentional. ?Treatment for essential  tremor depends on the severity of the condition. ?This information is not intended to replace advice given to you by your health care provider. Make sure you discuss any questions you have with your health care provider. ?Document Revised: 12/14/2020 Document Reviewed: 12/14/2020 ?Elsevier Patient Education ? 2023 Elsevier Inc. ? ?

## 2022-06-04 NOTE — Progress Notes (Signed)
Provider:  Larey Seat, MD  Primary Care Physician:  Ariana Chroman, MD [address] [place] [address]     Referring Provider: Glenda [name], [place],  [place] [address]          Chief Complaint according to patient   Patient presents with:     New Patient (Initial Visit)           HISTORY OF PRESENT ILLNESS:  [name] is a 72 y.o. female patient who is here for revisit 06/04/2022 for  Tremor follow up .  Chief concern according to patient :  My brothers have the tremor , just not to my degree- I was turned down by insurance for the wrist pulsation device.  Her tremors have worsen since last visit, especially in L hand.       12-04-2021: [name] is a 72 y.o. female and seen here upon referral from Dr. Woody Seller for a Consultation/ Evaluation of essential tremor;   History of present illness:   [name] is a 72 year old right-handed white female with a history of essential tremor.  The patient indicates that she was about 72 years old when she first started noticing a tremor.  The tremor has gradually worsened as she has gotten older.  Her brother now has a tremor, and there are several first cousins on her father side of the family with tremor.  The tremor has affected head and neck and both arms, she has noted difficulty with handwriting and with using utensils such as knives.  She does not cook for fear of spilling something and burning herself.  The patient has been on gabapentin and primidone in the past, she has been evaluated for possible deep brain stimulator but was not felt to be an adequate candidate secondary to depression.  The patient has done fairly well with low-dose alprazolam, she will take 0.5 mg intermittently for the tremor when she needs it.  She does have good days and bad days with tremor.  The patient reports some problems with gastroparesis, she has some fecal and urinary incontinence issues and a history of a right hemidiaphragm  problem.  She denies any falls or any significant gait instability.  She has no weakness of the extremities but she does note some numbness in the toes at times..   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 Gastroparesis. Chronic nausea. On zofran, on cymbalta , on xanax.      Status post failed gastric band surgery in 2007 . Had sepsis within a week of surgery, secondary abdominal wound healing, mash  repair in [place] at [facility] .     Family : 2 brothers out of 3 with essential tremor. Father died at age 9, PGF had tremors.        The patient is a 72 y.o. right handed female with a history of tremor.  Pt is accompanied by her husband and her best friend who supplements the history.  States that tremor started in 2007 after lap band surgery.  States that she had complications associated with this, including sepsis.  She states that she was told in [place] that she had "coded" and had anoxia and that there may have been a stroke but states that there has been some confusion about this diagnosis.  There was  no tremor present before this incident.  She was in the hospital for 3 months and she noted the onset of tremor in the hospital.  She started treatment in 2008.  She was started on primidone and didn't notice a big difference.  There is a ? family hx of tremor in a brother of hers, but nothing like hers.  Pt is currently on primidone 250 mg daily and xanax 1 mg tid.  She has tried topamax in the past for tremor and she cannot remember her experience with that but thinks that it didn't help.  She has not tried beta blocker therapy as it was felt that her depression was too significant for this therapy.  Pt admits to long term depression "all my life" but "I haven't been able to shake the depression" recently.  She has been on prozac for a long time and wellbutrin was  added without success.  She has never seen psychiatry but does have an appt with [facility] soon.  States that she does have total lack of focus and memory loss since her lap band surgery and cannot even boil water or will forget it on the stove.      Review of Systems: Out of a complete 14 system review, the patient complains of only the following symptoms, and all other reviewed systems are negative.:  Fatigue,   How likely are you to doze in the following situations: 0 = not likely, 1 = slight chance, 2 = moderate chance, 3 = high chance   Sitting and Reading? Watching Television? Sitting inactive in a public place (theater or meeting)? As a passenger in a car for an hour without a break? Lying down in the afternoon when circumstances permit? Sitting and talking to someone? Sitting quietly after lunch without alcohol? In a car, while stopped for a few minutes in traffic?   Total = 10/ 24 points , taking naps.   FSS endorsed at 43/ 63 points.   GDS 5/ 15   Social History   Socioeconomic History   Marital status: Married    Spouse name: Marcello Moores   Number of children: 1   Years of education: 1-College   Highest education level: Not on file  Occupational History   Occupation: retired  Tobacco Use   Smoking status: Never   Smokeless tobacco: Never  Vaping Use   Vaping Use: Never used  Substance and Sexual Activity   Alcohol use: Yes    Alcohol/week: 0.0 standard drinks of alcohol    Comment: rarely (1 every 2 months)   Drug use: No   Sexual activity: Never  Other Topics Concern   Not on file  Social History Narrative   Lives at home with husband   Patient is right handed.   Patient drinks caffeine occasionally.   Social Determinants of Health   Financial Resource Strain: Not on file  Food Insecurity: Not on file  Transportation Needs: Not on file  Physical Activity: Not on file  Stress: Not on file  Social Connections: Not on file    Family History   Problem Relation Age of Onset   Heart disease Mother    Heart disease Father    Stroke Son    Diabetes Maternal Aunt    Colon cancer Neg Hx    Esophageal cancer Neg Hx    Breast cancer Neg Hx    Rectal cancer Neg Hx    Stomach cancer Neg Hx     Past Medical History:  Diagnosis Date   Anemia    Barrett's esophagus 09/30/2018   EGD July 2020 - short segment, no dysplasia (repeat EGD July 2023)   Cerebrovascular disease    Depression with anxiety    Diabetes ([place])    Diverticulosis    Dyslipidemia    Fundic gland polyps of stomach, benign    GERD (gastroesophageal reflux disease)    Hepatomegaly    Hiatal hernia    Hyperlipidemia    Hypertension    Hypothyroidism    Obesity    Obesity    Osteoarthritis    Pre-diabetes    Tremor    Urinary incontinence    Ventral hernia    Vitamin D deficiency     Past Surgical History:  Procedure Laterality Date   ABDOMINAL HYSTERECTOMY     ABDOMINAL WALL MESH  REMOVAL     ABDOMINAL WALL MESH  REMOVAL     APPENDECTOMY     BREAST REDUCTION SURGERY     COLONOSCOPY     At a hospital. Can't remember   ESOPHAGOGASTRODUODENOSCOPY     serveral times. been years   GALLBLADDER SURGERY     LAPAROSCOPIC GASTRIC BANDING     REDUCTION MAMMAPLASTY     TRACHEAL STENOSIS REPAIR W/ PERFUSION AND MLB       Current Outpatient Medications on File Prior to Visit  Medication Sig Dispense Refill   ALPRAZolam (XANAX) 0.5 MG tablet Take 1 tablet (0.5 mg total) by mouth 2 (two) times daily as needed for anxiety. 60 tablet 0   aspirin 81 MG tablet Take 81 mg by mouth daily.     bumetanide (BUMEX) 1 MG tablet 1 tablet daily.     diphenhydrAMINE HCl (ALLERGY MED PO) Take 1 tablet by mouth daily as needed.     DULoxetine (CYMBALTA) 60 MG capsule 1 capsule daily.     famotidine (PEPCID) 40 MG tablet TAKE 1 TABLET EVERY 12 HOURS 180 tablet 3   Ferrous Sulfate (IRON) 325 (65 Fe) MG TABS 325 mg daily.     levothyroxine (SYNTHROID, LEVOTHROID) 50 MCG  tablet TAKE 1 TABLET DAILY BEFORE BREAKFAST. 90 tablet 1   Meclizine HCl 25 MG CHEW Chew by mouth.     Methylsulfonylmethane (MSM) 1500 MG TABS Take 1 tablet by mouth 2 (two) times daily.     modafinil (PROVIGIL) 200 MG tablet 1 tablet daily.     ondansetron (ZOFRAN) 4 MG tablet Take 1 tablet (4 mg total) by mouth every 8 (eight) hours as needed for nausea or vomiting. 30 tablet 1   PAPAYA ENZYME PO Take by mouth. After meals     potassium chloride (MICRO-K) 10 MEQ CR capsule Take 10 mEq by mouth daily.     pravastatin (PRAVACHOL) 40 MG tablet TAKE 1 TABLET AT BEDTIME FOR CHOLESTEROL (Patient taking differently: Take 40 mg by mouth daily.) 90 tablet 1   PROBIOTIC CAPS Take 2 capsules by mouth daily.      sucralfate (CARAFATE) 1 g tablet TAKE 1 TABLET AT BEDTIME. DO NOT TAKE WITHIN 2 HOURS OF OTHER MEDICATIONS. 90 tablet 1   Vitamin D, Cholecalciferol, 50 MCG (2000 UT) CAPS Take 1 capsule by mouth daily.     No current facility-administered medications on file prior to visit.    Allergies  Allergen Reactions   Cefazolin Anaphylaxis   Ace Inhibitors Cough   Enalapril Cough   Quinapril    Trazodone And Nefazodone    Ivp Dye [Iodinated Contrast Media]    Penicillin  G Rash   Penicillins      DIAGNOSTIC DATA (LABS, IMAGING, TESTING) - I reviewed patient records, labs, notes, testing and imaging myself where available.  Lab Results  Component Value Date   WBC 4.5 01/16/2015   HGB 14.8 01/16/2015   HCT 45.0 01/16/2015   MCV 90.9 01/16/2015   PLT 167 01/16/2015      Component Value Date/Time   NA 140 01/15/2021 1210   K 4.0 01/15/2021 1210   CL 101 01/15/2021 1210   CO2 31 01/15/2021 1210   GLUCOSE 118 (H) 01/15/2021 1210   BUN 17 01/15/2021 1210   CREATININE 1.02 01/15/2021 1210   CREATININE 0.88 01/16/2015 1008   CALCIUM 9.7 01/15/2021 1210   PROT 7.1 01/15/2021 1210   ALBUMIN 4.3 01/15/2021 1210   AST 13 01/15/2021 1210   ALT 7 01/15/2021 1210   ALKPHOS 103 01/15/2021  1210   BILITOT 0.6 01/15/2021 1210   GFRNONAA 69 01/16/2015 1008   GFRAA 80 01/16/2015 1008   Lab Results  Component Value Date   CHOL 163 01/16/2015   HDL 73 01/16/2015   LDLCALC 72 01/16/2015   TRIG 91 01/16/2015   CHOLHDL 2.2 01/16/2015   Lab Results  Component Value Date   HGBA1C 5.5 01/16/2015   Lab Results  Component Value Date   [id number] 08/29/2013   Lab Results  Component Value Date   TSH 1.002 01/16/2015    PHYSICAL EXAM:  Today's Vitals   06/04/22 1428 06/04/22 1433  BP: (!) 154/104 (!) 164/111  Pulse: 87 78  Weight: 187 lb (84.8 kg)   Height: 5\' 1"  (1.549 m)    Body mass index is 35.33 kg/m.   Wt Readings from Last 3 Encounters:  06/04/22 187 lb (84.8 kg)  12/04/21 185 lb 8 oz (84.1 kg)  09/17/21 185 lb 4 oz (84 kg)     Ht Readings from Last 3 Encounters:  06/04/22 5\' 1"  (1.549 m)  12/04/21 5\' 1"  (1.549 m)  09/17/21 5\' 2"  (1.575 m)      General: General: The patient is awake, alert and appears not in acute distress.  The patient is well groomed. Head: Normocephalic, atraumatic.    Neck is supple.   Neck circumference:15" Cardiovascular:  Regular rate and palpable peripheral pulse:  Respiratory: clear to auscultation.  Mallampati: , Skin:  Without evidence of ankle edema, or rash Trunk:  normal posture. Not stooped.      Neurologic exam : The patient is awake and alert, oriented to place and time.   Memory subjective  described as intact.  There is a normal attention span & concentration ability.  Speech is fluent with  dysphonia.   Mood and affect are appropriate.   Cranial nerves: Pupils are equal and briskly reactive to light. Funduscopic exam without  evidence of pallor or edema. Extraocular movements  in vertical and horizontal planes intact and without nystagmus. Visual fields by finger perimetry are intact. Hearing to finger rub intact.  Facial sensation intact to fine touch. Facial motor strength is symmetric and tongue  and uvula move midline.   Motor exam:   Normal tone and normal muscle bulk and symmetric normal strength in all extremities. Grip Strength is intact . No cogwheeling.  Proximal strength of shoulder muscles and hip flexors was full. .   Sensory:  Fine touch and vibration were tested . Proprioception was tested in the upper extremities only and was  normal.   Coordination: Rapid alternating movements in the  fingers/hands were normal.  Finger-to-nose maneuver was affected by action tremor.   Gait and station: Patient walked without assistive device . Core Strength within normal limits. Stance is stable and of normal base.  Deep tendon reflexes: in the  upper and lower extremities are symmetric.      ASSESSMENT AND PLAN 72 y.o. year old female  here with:  Tremor , essential- was turned down by Dr Tat for DBS.  Has been on Mysoline, without effect- on Xanax with some success.  Had topiramate in 2016- no recollection what effect it had. No history of kidney stones.  Beta blocker - propanolol. Can't recollect effect or side effect, no syncope, no asthma. It was not chosen because of her underlying depression ( dr willis,  2013)      1) Progressive essential tremor,  having affected handwriting, cooking, feeding. Xanax prn.  Will refill.    2) no parkinsonism is present.   3) no memory deficits. History of depression but controlled under medications.    I plan to follow up through our NP within 12 months.   I would like to thank Ariana Chroman, MD and [name], [place],  [place] [address] for allowing me to meet with and to take care of this pleasant patient.   CC: I will share my notes with PCP.  After spending a total time of  25  minutes face to face and additional time for physical and neurologic examination, review of laboratory studies,  personal review of imaging studies, reports and results of other testing and review of referral information / records as far as  provided in visit,   Electronically signed by: Larey Seat, MD 06/04/2022 3:12 PM  [facility] Board certified by The [employer] of Sleep Medicine and Diplomate of the [employer] of Sleep Medicine. Board certified In Neurology through the [employer], Fellow of the [employer] of Neurology. Medical Director of [facility].

## 2022-06-17 DIAGNOSIS — E8940 Asymptomatic postprocedural ovarian failure: Secondary | ICD-10-CM | POA: Diagnosis not present

## 2022-06-23 ENCOUNTER — Other Ambulatory Visit: Payer: Self-pay | Admitting: Nurse Practitioner

## 2022-07-01 DIAGNOSIS — I10 Essential (primary) hypertension: Secondary | ICD-10-CM | POA: Diagnosis not present

## 2022-07-01 DIAGNOSIS — L259 Unspecified contact dermatitis, unspecified cause: Secondary | ICD-10-CM | POA: Diagnosis not present

## 2022-07-01 DIAGNOSIS — M810 Age-related osteoporosis without current pathological fracture: Secondary | ICD-10-CM | POA: Diagnosis not present

## 2022-07-01 DIAGNOSIS — Z299 Encounter for prophylactic measures, unspecified: Secondary | ICD-10-CM | POA: Diagnosis not present

## 2022-07-07 DIAGNOSIS — Z299 Encounter for prophylactic measures, unspecified: Secondary | ICD-10-CM | POA: Diagnosis not present

## 2022-07-07 DIAGNOSIS — R21 Rash and other nonspecific skin eruption: Secondary | ICD-10-CM | POA: Diagnosis not present

## 2022-07-07 DIAGNOSIS — L509 Urticaria, unspecified: Secondary | ICD-10-CM | POA: Diagnosis not present

## 2022-07-22 DIAGNOSIS — Z1283 Encounter for screening for malignant neoplasm of skin: Secondary | ICD-10-CM | POA: Diagnosis not present

## 2022-07-22 DIAGNOSIS — D225 Melanocytic nevi of trunk: Secondary | ICD-10-CM | POA: Diagnosis not present

## 2022-07-25 ENCOUNTER — Telehealth: Payer: Self-pay | Admitting: Internal Medicine

## 2022-07-25 NOTE — Telephone Encounter (Signed)
Pt states she has been having issues with fecal incontinence. Reports sometimes she can have a BM and not even know she has done so, other times she may reach up to get something and stool comes out. Reports is seems to be happening more frequently. Pt scheduled to see Dr. Rhea Belton 10/02/22 at 4pm, pt aware of appt.

## 2022-07-25 NOTE — Telephone Encounter (Signed)
Patient called regarding some symptoms she is experiencing currently. She would like a call back to discuss these symptoms to make sure she does not have to be referred anywhere else. Please advise, thank you.

## 2022-08-05 DIAGNOSIS — M810 Age-related osteoporosis without current pathological fracture: Secondary | ICD-10-CM | POA: Diagnosis not present

## 2022-09-02 DIAGNOSIS — R739 Hyperglycemia, unspecified: Secondary | ICD-10-CM | POA: Diagnosis not present

## 2022-09-02 DIAGNOSIS — R7303 Prediabetes: Secondary | ICD-10-CM | POA: Diagnosis not present

## 2022-09-02 DIAGNOSIS — Z299 Encounter for prophylactic measures, unspecified: Secondary | ICD-10-CM | POA: Diagnosis not present

## 2022-09-02 DIAGNOSIS — I10 Essential (primary) hypertension: Secondary | ICD-10-CM | POA: Diagnosis not present

## 2022-10-02 ENCOUNTER — Encounter: Payer: Self-pay | Admitting: Internal Medicine

## 2022-10-02 ENCOUNTER — Ambulatory Visit: Payer: Medicare HMO | Admitting: Internal Medicine

## 2022-10-02 VITALS — BP 148/82 | HR 77 | Ht 61.0 in | Wt 190.0 lb

## 2022-10-02 DIAGNOSIS — K2270 Barrett's esophagus without dysplasia: Secondary | ICD-10-CM | POA: Diagnosis not present

## 2022-10-02 DIAGNOSIS — K3184 Gastroparesis: Secondary | ICD-10-CM | POA: Diagnosis not present

## 2022-10-02 DIAGNOSIS — K219 Gastro-esophageal reflux disease without esophagitis: Secondary | ICD-10-CM | POA: Diagnosis not present

## 2022-10-02 DIAGNOSIS — R159 Full incontinence of feces: Secondary | ICD-10-CM

## 2022-10-02 NOTE — Progress Notes (Signed)
Subjective:    Patient ID: [name], female    DOB: [birth year], 72 y.o.   MRN: [id number]  HPI [name] is a 72 year old female with a history of GERD, short segment Barrett's esophagus without dysplasia, anemia on oral iron, gastroparesis, diverticulosis, prior hiatal hernia surgery, prior infected ventral hernia requiring laparotomy, diabetes, hypertension, hyperlipidemia who is here for follow-up to address fecal smearing and encopresis.  She is here alone today.  She reports that it is difficult for her to discuss this symptom and she has never mentioned it to me before because it is simply hard for her to talk about.  She states she has dealt with years of minor fecal smearing but this is becoming more frequent and higher volume.  Recently she had an episode of encopresis which she was unaware of which occurred while sitting on her couch.  She is not having abdominal pain, anal or rectal pain.  She will occasionally have anal spasm but this is separate from her fecal smearing.  She has not seen red blood with stool or wiping.  Bowel movements vary from formed to "pudding-like".  No diarrhea.  She states that the symptoms interfere with her sex life and she hopes to "have sex 1 more time before I die".  She admits to avoiding sexual activity with her husband over fear of her fecal smearing.  She has been using Cologuard because she worries about her prior abdominal surgeries, adhesive disease and perforation with colonoscopy.  She does have some intermittent gastroparesis symptoms and on 1 occasion when she was out with friends she had an episode of vomiting.  She does have some early fullness.  She will occasionally take a prophylactic Zofran before going out with friends.  Famotidine twice daily is working well for her heartburn and she is not having dysphagia.  Her last Cologuard was negative in 2022   Review of Systems As per HPI, otherwise negative  Current Medications,  Allergies, Past Medical History, Past Surgical History, Family History and Social History were reviewed in [employer] record.    Objective:   Physical Exam BP (!) 148/82   Pulse 77   Ht 5\' 1"  (1.549 m)   Wt 190 lb (86.2 kg)   SpO2 97%   BMI 35.90 kg/m  Gen: awake, alert, NAD HEENT: anicteric  Abd: soft, NT/ND, +BS throughout Ext: no c/c/e Neuro: nonfocal     Assessment & Plan:  72 year old female with a history of GERD, short segment Barrett's esophagus without dysplasia, anemia on oral iron, gastroparesis, diverticulosis, prior hiatal hernia surgery, prior infected ventral hernia requiring laparotomy, diabetes, hypertension, hyperlipidemia who is here for follow-up to address fecal smearing and encopresis.  Fecal smearing/encopresis --we need to rule out structural pathology.  She prefers to avoid colonoscopy over her fear of perforation.  She would be agreeable after we discussed the risk, benefits and alternatives to flexible sigmoidoscopy.  We will perform rectal exam at that time.  This could be pelvic floor dysfunction/dyssynergy defecation.  She may benefit from fiber supplementation and pelvic floor physical therapy. -- Flexible sigmoidoscopy -- Further recommendations thereafter  2.  GERD and Barrett's esophagus without dysplasia --she is up-to-date with surveillance endoscopy -- Continue famotidine 40 mg twice daily -- Okay for sucralfate 1 tablet at bedtime as needed  3.  Gastroparesis --gastroparesis diet.  Ondansetron 4 mg every 6-8 hours as needed for nausea and vomiting  4.  Colon cancer screening --given reluctance to start  with colonoscopy would repeat Cologuard next year  40 minutes total spent today including patient facing time, coordination of care, reviewing medical history/procedures/pertinent radiology studies, and documentation of the encounter.

## 2022-10-02 NOTE — Patient Instructions (Signed)
Stay on famotidine twice daily.   You have been scheduled for a flexible sigmoidoscopy. Please follow the written instructions given to you at your visit today.  If you use inhalers (even only as needed), please bring them with you on the day of your procedure.  DO NOT TAKE 7 DAYS PRIOR TO TEST- Trulicity (dulaglutide) Ozempic, Wegovy (semaglutide) Mounjaro (tirzepatide) Bydureon Bcise (exanatide extended release)  DO NOT TAKE 1 DAY PRIOR TO YOUR TEST Rybelsus (semaglutide) Adlyxin (lixisenatide) Victoza (liraglutide) Byetta (exanatide) ___________________________________________________________________________  _______________________________________________________  If your blood pressure at your visit was 140/90 or greater, please contact your primary care physician to follow up on this.  _______________________________________________________  If you are age 21 or older, your body mass index should be between 23-30. Your Body mass index is 35.9 kg/m. If this is out of the aforementioned range listed, please consider follow up with your Primary Care Provider.  If you are age 64 or younger, your body mass index should be between 19-25. Your Body mass index is 35.9 kg/m. If this is out of the aformentioned range listed, please consider follow up with your Primary Care Provider.   ________________________________________________________  The [place] [facility] providers would like to encourage you to use [facility] to communicate with providers for non-urgent requests or questions.  Due to long hold times on the telephone, sending your provider a message by [facility] may be a faster and more efficient way to get a response.  Please allow 48 business hours for a response.  Please remember that this is for non-urgent requests.  _______________________________________________________

## 2022-11-06 DIAGNOSIS — R059 Cough, unspecified: Secondary | ICD-10-CM | POA: Diagnosis not present

## 2022-11-06 DIAGNOSIS — R5383 Other fatigue: Secondary | ICD-10-CM | POA: Diagnosis not present

## 2022-11-06 DIAGNOSIS — Z20822 Contact with and (suspected) exposure to covid-19: Secondary | ICD-10-CM | POA: Diagnosis not present

## 2022-11-15 ENCOUNTER — Encounter: Payer: Self-pay | Admitting: Certified Registered Nurse Anesthetist

## 2022-11-16 ENCOUNTER — Other Ambulatory Visit: Payer: Self-pay | Admitting: Nurse Practitioner

## 2022-11-20 ENCOUNTER — Ambulatory Visit (AMBULATORY_SURGERY_CENTER): Payer: Medicare HMO | Admitting: Internal Medicine

## 2022-11-20 ENCOUNTER — Encounter: Payer: Self-pay | Admitting: Internal Medicine

## 2022-11-20 VITALS — BP 154/76 | HR 66 | Temp 97.5°F | Resp 10 | Ht 61.0 in | Wt 190.0 lb

## 2022-11-20 DIAGNOSIS — D375 Neoplasm of uncertain behavior of rectum: Secondary | ICD-10-CM

## 2022-11-20 DIAGNOSIS — D125 Benign neoplasm of sigmoid colon: Secondary | ICD-10-CM | POA: Diagnosis not present

## 2022-11-20 DIAGNOSIS — R159 Full incontinence of feces: Secondary | ICD-10-CM

## 2022-11-20 DIAGNOSIS — D126 Benign neoplasm of colon, unspecified: Secondary | ICD-10-CM | POA: Diagnosis not present

## 2022-11-20 DIAGNOSIS — D128 Benign neoplasm of rectum: Secondary | ICD-10-CM

## 2022-11-20 DIAGNOSIS — E039 Hypothyroidism, unspecified: Secondary | ICD-10-CM | POA: Diagnosis not present

## 2022-11-20 DIAGNOSIS — E669 Obesity, unspecified: Secondary | ICD-10-CM | POA: Diagnosis not present

## 2022-11-20 DIAGNOSIS — I10 Essential (primary) hypertension: Secondary | ICD-10-CM | POA: Diagnosis not present

## 2022-11-20 MED ORDER — SODIUM CHLORIDE 0.9 % IV SOLN: 500.0000 mL | Freq: Once | INTRAVENOUS | Status: DC

## 2022-11-20 NOTE — Progress Notes (Signed)
Report given to PACU, vss 

## 2022-11-20 NOTE — Progress Notes (Signed)
Called to room to assist during endoscopic procedure.  Patient ID and intended procedure confirmed with present staff. Received instructions for my participation in the procedure from the performing physician.  

## 2022-11-20 NOTE — Op Note (Signed)
[facility] Patient Name: [name] Procedure Date: 11/20/2022 3:29 PM MRN: [id number] Endoscopist: Beverley Fiedler , MD, [id number] Age: 72 Referring MD:  Date of Birth: [birth year] Gender: Female Account #: [id number] Procedure:                Flexible Sigmoidoscopy Indications:              Incontinence of feces Medicines:                Monitored Anesthesia Care Procedure:                Pre-Anesthesia Assessment:                           - Prior to the procedure, a History and Physical                            was performed, and patient medications and                            allergies were reviewed. The patient's tolerance of                            previous anesthesia was also reviewed. The risks                            and benefits of the procedure and the sedation                            options and risks were discussed with the patient.                            All questions were answered, and informed consent                            was obtained. Prior Anticoagulants: The patient has                            taken no anticoagulant or antiplatelet agents. ASA                            Grade Assessment: III - A patient with severe                            systemic disease. After reviewing the risks and                            benefits, the patient was deemed in satisfactory                            condition to undergo the procedure.                           After obtaining informed consent, the scope was  passed under direct vision. The PCF-[id number] Slim SN                            [id number] was introduced through the anus and                            advanced to the sigmoid colon. The flexible                            sigmoidoscopy was accomplished without difficulty.                            The patient tolerated the procedure well. The                            quality of the bowel preparation was  good. Scope In: 3:47:12 PM Scope Out: 3:55:19 PM Total Procedure Duration: 0 hours 8 minutes 7 seconds  Findings:                 The digital rectal exam revealed a rubbery-textured                            and nodular rectal mass.                           A fungating non-obstructing mass was found in the                            distal rectum. The mass was non-circumferential.                            The mass measured three cm in length. In addition,                            its diameter measured thirty mm. This was biopsied                            with a cold forceps for histology.                           A 6 mm polyp was found in the sigmoid colon. The                            polyp was sessile. The polyp was removed with a                            cold snare. Resection and retrieval were complete.                           Multiple small-mouthed diverticula were found in                            the sigmoid colon. Complications:  No immediate complications. Estimated Blood Loss:     Estimated blood loss was minimal. Impression:               - Rectal mass palpable on rectal examination.                           - Rule out malignancy, tumor in the distal rectum.                            Biopsied.                           - One 6 mm polyp in the sigmoid colon, removed with                            a cold snare. Resected and retrieved.                           - Diverticulosis in the sigmoid colon. Recommendation:           - Patient has a contact number available for                            emergencies. The signs and symptoms of potential                            delayed complications were discussed with the                            patient. Return to normal activities tomorrow.                            Written discharge instructions were provided to the                            patient.                           - Resume previous  diet.                           - Continue present medications.                           - Await pathology results.                           - CT scan of the abd/pelvis with contrast is                            recommended.                           - Colonoscopy is recommended given findings today.                            Depending on biopsy  results will need to discuss                            surgical referral for TEM versus endoscopic                            resection (EMR) of rectal mass. Beverley Fiedler, MD 11/20/2022 4:03:13 PM This report has been signed electronically.

## 2022-11-20 NOTE — Patient Instructions (Addendum)
Await pathology.    Resume previous diet. Continue present medications.  Handout on polyps and diverticulosis provided.  YOU HAD AN ENDOSCOPIC PROCEDURE TODAY AT THE [facility]:   Refer to the procedure report that was given to you for any specific questions about what was found during the examination.  If the procedure report does not answer your questions, please call your gastroenterologist to clarify.  If you requested that your care partner not be given the details of your procedure findings, then the procedure report has been included in a sealed envelope for you to review at your convenience later.  YOU SHOULD EXPECT: Some feelings of bloating in the abdomen. Passage of more gas than usual.  Walking can help get rid of the air that was put into your GI tract during the procedure and reduce the bloating. If you had a lower endoscopy (such as a colonoscopy or flexible sigmoidoscopy) you may notice spotting of blood in your stool or on the toilet paper. If you underwent a bowel prep for your procedure, you may not have a normal bowel movement for a few days.  Please Note:  You might notice some irritation and congestion in your nose or some drainage.  This is from the oxygen used during your procedure.  There is no need for concern and it should clear up in a day or so.  SYMPTOMS TO REPORT IMMEDIATELY:  Following lower endoscopy (colonoscopy or flexible sigmoidoscopy):  Excessive amounts of blood in the stool  Significant tenderness or worsening of abdominal pains  Swelling of the abdomen that is new, acute  Fever of 100F or higher   For urgent or emergent issues, a gastroenterologist can be reached at any hour by calling [phone]. Do not use MyChart messaging for urgent concerns.    DIET:  We do recommend a small meal at first, but then you may proceed to your regular diet.  Drink plenty of fluids but you should avoid alcoholic beverages for 24 hours.  ACTIVITY:   You should plan to take it easy for the rest of today and you should NOT DRIVE or use heavy machinery until tomorrow (because of the sedation medicines used during the test).    FOLLOW UP: Our staff will call the number listed on your records the next business day following your procedure.  We will call around 7:15- 8:00 am to check on you and address any questions or concerns that you may have regarding the information given to you following your procedure. If we do not reach you, we will leave a message.     If any biopsies were taken you will be contacted by phone or by letter within the next 1-3 weeks.  Please call us at [phone] if you have not heard about the biopsies in 3 weeks.    SIGNATURES/CONFIDENTIALITY: You and/or your care partner have signed paperwork which will be entered into your electronic medical record.  These signatures attest to the fact that that the information above on your After Visit Summary has been reviewed and is understood.  Full responsibility of the confidentiality of this discharge information lies with you and/or your care-partner.

## 2022-11-20 NOTE — Progress Notes (Signed)
GASTROENTEROLOGY PROCEDURE H&P NOTE   Primary Care Physician: Ignatius Specking, MD    Reason for Procedure:  Fecal incontinence/encopresis  Plan:    Flexible sigmoidoscopy  Patient is appropriate for endoscopic procedure(s) in the ambulatory ([facility]) setting.  The nature of the procedure, as well as the risks, benefits, and alternatives were carefully and thoroughly reviewed with the patient. Ample time for discussion and questions allowed. The patient understood, was satisfied, and agreed to proceed.     HPI: [name] is a 72 y.o. female who presents for flexible sigmoidoscopy.  Medical history as below.  Tolerated the prep.  No recent chest pain or shortness of breath.  No abdominal pain today.  Past Medical History:  Diagnosis Date   Anemia    Barrett's esophagus 09/30/2018   EGD July 2020 - short segment, no dysplasia (repeat EGD July 2023)   Cerebrovascular disease    Depression with anxiety    Diabetes (HCC)    Diverticulosis    Dyslipidemia    Fundic gland polyps of stomach, benign    GERD (gastroesophageal reflux disease)    Hepatomegaly    Hiatal hernia    Hyperlipidemia    Hypertension    Hypothyroidism    Obesity    Obesity    Osteoarthritis    Pre-diabetes    Tracheal stenosis    Tremor    Urinary incontinence    Ventral hernia    Vitamin D deficiency     Past Surgical History:  Procedure Laterality Date   ABDOMINAL HYSTERECTOMY     ABDOMINAL WALL MESH  REMOVAL     ABDOMINAL WALL MESH  REMOVAL     APPENDECTOMY     BREAST REDUCTION SURGERY     COLONOSCOPY     At a hospital. Can't remember   ESOPHAGOGASTRODUODENOSCOPY     serveral times. been years   GALLBLADDER SURGERY     LAPAROSCOPIC GASTRIC BANDING     REDUCTION MAMMAPLASTY     TRACHEAL STENOSIS REPAIR W/ PERFUSION AND MLB      Prior to Admission medications   Medication Sig Start Date End Date Taking? Authorizing Provider  aspirin 81 MG tablet Take 81 mg by mouth daily.   Yes  [provider]  Biotin 5000 MCG CAPS Take 1 capsule by mouth daily.   Yes [provider]  bumetanide (BUMEX) 1 MG tablet 1 tablet daily. 04/22/18  Yes [provider]  diphenhydrAMINE HCl (ALLERGY MED PO) Take 1 tablet by mouth daily as needed.   Yes [provider]  DULoxetine (CYMBALTA) 60 MG capsule 1 capsule daily. 08/09/18  Yes [provider]  famotidine (PEPCID) 40 MG tablet TAKE 1 TABLET EVERY 12 HOURS 05/08/22  Yes Amylia Collazos, [name], MD  Ferrous Sulfate (IRON) 325 (65 Fe) MG TABS 325 mg daily. 06/12/21  Yes [provider]  levothyroxine (SYNTHROID, LEVOTHROID) 50 MCG tablet TAKE 1 TABLET DAILY BEFORE BREAKFAST. 08/14/14  Yes Doree Albee, PA-C  Methylsulfonylmethane (MSM) 1500 MG TABS Take 1 tablet by mouth 2 (two) times daily.   Yes [provider]  modafinil (PROVIGIL) 200 MG tablet 1 tablet daily. 05/19/18  Yes [provider]  PAPAYA ENZYME PO Take by mouth. After meals   Yes [provider]  pravastatin (PRAVACHOL) 40 MG tablet TAKE 1 TABLET AT BEDTIME FOR CHOLESTEROL Patient taking differently: Take 40 mg by mouth daily. 03/29/15  Yes Lucky Cowboy, MD  PROBIOTIC CAPS Take 2 capsules by mouth daily.  Yes [provider]  sucralfate (CARAFATE) 1 g tablet TAKE 1 TABLET AT BEDTIME. DO NOT TAKE WITHIN 2 HOURS OF OTHER MEDICATIONS 11/16/22  Yes Arnaldo Natal, NP  Vitamin D, Cholecalciferol, 50 MCG (2000 UT) CAPS Take 1 capsule by mouth daily.   Yes [provider]  ALPRAZolam (XANAX) 0.5 MG tablet Take 1 tablet (0.5 mg total) by mouth 2 (two) times daily as needed for anxiety. 06/04/22   Dohmeier, Porfirio Mylar, MD  Meclizine HCl 25 MG CHEW Chew by mouth. Patient not taking: Reported on 10/02/2022    [provider]  ondansetron (ZOFRAN) 4 MG tablet Take 1 tablet (4 mg total) by mouth every 8 (eight) hours as needed for nausea or vomiting. 09/17/21   Zaid Tomes, [name], MD  potassium  chloride (MICRO-K) 10 MEQ CR capsule Take 10 mEq by mouth daily. Patient not taking: Reported on 10/02/2022 06/05/21   [provider]    Current Outpatient Medications  Medication Sig Dispense Refill   aspirin 81 MG tablet Take 81 mg by mouth daily.     Biotin 5000 MCG CAPS Take 1 capsule by mouth daily.     bumetanide (BUMEX) 1 MG tablet 1 tablet daily.     diphenhydrAMINE HCl (ALLERGY MED PO) Take 1 tablet by mouth daily as needed.     DULoxetine (CYMBALTA) 60 MG capsule 1 capsule daily.     famotidine (PEPCID) 40 MG tablet TAKE 1 TABLET EVERY 12 HOURS 180 tablet 3   Ferrous Sulfate (IRON) 325 (65 Fe) MG TABS 325 mg daily.     levothyroxine (SYNTHROID, LEVOTHROID) 50 MCG tablet TAKE 1 TABLET DAILY BEFORE BREAKFAST. 90 tablet 1   Methylsulfonylmethane (MSM) 1500 MG TABS Take 1 tablet by mouth 2 (two) times daily.     modafinil (PROVIGIL) 200 MG tablet 1 tablet daily.     PAPAYA ENZYME PO Take by mouth. After meals     pravastatin (PRAVACHOL) 40 MG tablet TAKE 1 TABLET AT BEDTIME FOR CHOLESTEROL (Patient taking differently: Take 40 mg by mouth daily.) 90 tablet 1   PROBIOTIC CAPS Take 2 capsules by mouth daily.      sucralfate (CARAFATE) 1 g tablet TAKE 1 TABLET AT BEDTIME. DO NOT TAKE WITHIN 2 HOURS OF OTHER MEDICATIONS 90 tablet 2   Vitamin D, Cholecalciferol, 50 MCG (2000 UT) CAPS Take 1 capsule by mouth daily.     ALPRAZolam (XANAX) 0.5 MG tablet Take 1 tablet (0.5 mg total) by mouth 2 (two) times daily as needed for anxiety. 60 tablet 0   Meclizine HCl 25 MG CHEW Chew by mouth. (Patient not taking: Reported on 10/02/2022)     ondansetron (ZOFRAN) 4 MG tablet Take 1 tablet (4 mg total) by mouth every 8 (eight) hours as needed for nausea or vomiting. 30 tablet 1   potassium chloride (MICRO-K) 10 MEQ CR capsule Take 10 mEq by mouth daily. (Patient not taking: Reported on 10/02/2022)     Current Facility-Administered Medications  Medication Dose Route Frequency Provider Last Rate  Last Admin   0.9 %  sodium chloride infusion  500 mL Intravenous Once Sayeed Weatherall, [name], MD        Allergies as of 11/20/2022 - Review Complete 11/20/2022  Allergen Reaction Noted   Cefazolin Anaphylaxis 03/25/2012   Ace inhibitors Cough 04/22/2018   Enalapril Cough 03/01/2013   Quinapril  03/01/2013   Trazodone and nefazodone  03/01/2013   Ivp dye [iodinated contrast media]  03/25/2012   Penicillin g Rash 04/22/2018  Penicillins  03/25/2012    Family History  Problem Relation Age of Onset   Heart disease Mother    Heart disease Father    Melanoma Brother    Stroke Son    Diabetes Maternal Aunt    Colon cancer Neg Hx    Esophageal cancer Neg Hx    Breast cancer Neg Hx    Rectal cancer Neg Hx    Stomach cancer Neg Hx     Social History   Socioeconomic History   Marital status: Married    Spouse name: Maisie Fus   Number of children: 1   Years of education: 1-College   Highest education level: Not on file  Occupational History   Occupation: retired  Tobacco Use   Smoking status: Never   Smokeless tobacco: Never  Vaping Use   Vaping status: Never Used  Substance and Sexual Activity   Alcohol use: Yes    Alcohol/week: 0.0 standard drinks of alcohol    Comment: rarely (1 every 2 months)   Drug use: No   Sexual activity: Never  Other Topics Concern   Not on file  Social History Narrative   Lives at home with husband   Patient is right handed.   Patient drinks caffeine occasionally.   Social Determinants of Health   Financial Resource Strain: Not on file  Food Insecurity: Not on file  Transportation Needs: Not on file  Physical Activity: Not on file  Stress: Not on file  Social Connections: Not on file  Intimate Partner Violence: Not on file    Physical Exam: Vital signs in last 24 hours: @BP  130/79   Pulse (!) 106   Temp (!) 97.5 F (36.4 C)   Ht 5\' 1"  (1.549 m)   Wt 190 lb (86.2 kg)   SpO2 98%   BMI 35.90 kg/m  GEN: NAD EYE: Sclerae anicteric ENT:  MMM CV: Non-tachycardic Pulm: CTA b/l GI: Soft, NT/ND NEURO:  Alert & Oriented x 3   Erick Blinks, MD [facility]  11/20/2022 3:31 PM

## 2022-11-21 ENCOUNTER — Telehealth: Payer: Self-pay

## 2022-11-21 LAB — SURGICAL PATHOLOGY

## 2022-11-21 NOTE — Telephone Encounter (Signed)
  Follow up Call-     11/20/2022    3:01 PM 07/01/2021   12:53 PM  Call back number  Post procedure Call Back phone  # [phone]  Permission to leave phone message Yes Yes     Patient questions:  Do you have a fever, pain , or abdominal swelling? No. Pain Score  0 *  Have you tolerated food without any problems? Yes.    Have you been able to return to your normal activities? Yes.    Do you have any questions about your discharge instructions: Diet   No. Medications  No. Follow up visit  No.  Do you have questions or concerns about your Care? No.  Actions: * If pain score is 4 or above: No action needed, pain <4.

## 2022-11-24 ENCOUNTER — Other Ambulatory Visit (INDEPENDENT_AMBULATORY_CARE_PROVIDER_SITE_OTHER): Payer: Medicare HMO

## 2022-11-24 ENCOUNTER — Other Ambulatory Visit: Payer: Self-pay

## 2022-11-24 DIAGNOSIS — R159 Full incontinence of feces: Secondary | ICD-10-CM

## 2022-11-24 DIAGNOSIS — K6289 Other specified diseases of anus and rectum: Secondary | ICD-10-CM

## 2022-11-24 LAB — BUN: BUN: 18 mg/dL (ref 6–23)

## 2022-11-24 LAB — CREATININE, SERUM: Creatinine, Ser: 1 mg/dL (ref 0.40–1.20)

## 2022-11-24 MED ORDER — PREDNISONE 50 MG PO TABS: ORAL_TABLET | ORAL | 0 refills | Status: DC

## 2022-11-24 MED ORDER — DIPHENHYDRAMINE HCL 50 MG PO TABS: ORAL_TABLET | ORAL | 0 refills | Status: AC

## 2022-11-24 NOTE — Addendum Note (Signed)
Addended by: Jovita Kussmaul L on: 11/24/2022 12:05 PM   Modules accepted: Orders

## 2022-11-26 ENCOUNTER — Other Ambulatory Visit: Payer: Self-pay

## 2022-11-26 DIAGNOSIS — K6289 Other specified diseases of anus and rectum: Secondary | ICD-10-CM

## 2022-11-26 MED ORDER — NA SULFATE-K SULFATE-MG SULF 17.5-3.13-1.6 GM/177ML PO SOLN: 1.0000 | Freq: Once | ORAL | 0 refills | Status: AC

## 2022-11-27 ENCOUNTER — Ambulatory Visit (HOSPITAL_COMMUNITY)
Admission: RE | Admit: 2022-11-27 | Discharge: 2022-11-27 | Disposition: A | Discharge status: Home / Self Care | Payer: Medicare HMO | Source: Ambulatory Visit | Attending: Internal Medicine | Admitting: Internal Medicine

## 2022-11-27 DIAGNOSIS — R159 Full incontinence of feces: Secondary | ICD-10-CM | POA: Insufficient documentation

## 2022-11-27 DIAGNOSIS — K769 Liver disease, unspecified: Secondary | ICD-10-CM | POA: Diagnosis not present

## 2022-11-27 DIAGNOSIS — K6289 Other specified diseases of anus and rectum: Secondary | ICD-10-CM | POA: Insufficient documentation

## 2022-11-27 DIAGNOSIS — K449 Diaphragmatic hernia without obstruction or gangrene: Secondary | ICD-10-CM | POA: Diagnosis not present

## 2022-11-27 DIAGNOSIS — K5730 Diverticulosis of large intestine without perforation or abscess without bleeding: Secondary | ICD-10-CM | POA: Diagnosis not present

## 2022-11-27 MED ORDER — BARIUM SULFATE 2 % PO SUSP
900.0000 mL | Freq: Once | ORAL | Status: AC
  Administered 2022-11-27: 900 mL via ORAL

## 2022-11-27 MED ORDER — IOHEXOL 300 MG/ML  SOLN
100.0000 mL | Freq: Once | INTRAMUSCULAR | Status: AC | PRN
  Administered 2022-11-27: 100 mL via INTRAVENOUS

## 2022-11-28 ENCOUNTER — Encounter: Payer: Self-pay | Admitting: Internal Medicine

## 2022-12-15 ENCOUNTER — Encounter: Payer: Self-pay | Admitting: Gastroenterology

## 2022-12-17 ENCOUNTER — Encounter: Payer: Self-pay | Admitting: Gastroenterology

## 2022-12-17 ENCOUNTER — Other Ambulatory Visit (INDEPENDENT_AMBULATORY_CARE_PROVIDER_SITE_OTHER): Payer: Medicare HMO

## 2022-12-17 ENCOUNTER — Ambulatory Visit: Payer: Medicare HMO | Admitting: Gastroenterology

## 2022-12-17 VITALS — BP 144/86 | HR 76 | Ht 61.0 in | Wt 194.4 lb

## 2022-12-17 DIAGNOSIS — Z860101 Personal history of adenomatous and serrated colon polyps: Secondary | ICD-10-CM | POA: Diagnosis not present

## 2022-12-17 DIAGNOSIS — D128 Benign neoplasm of rectum: Secondary | ICD-10-CM

## 2022-12-17 DIAGNOSIS — K6289 Other specified diseases of anus and rectum: Secondary | ICD-10-CM

## 2022-12-17 LAB — CBC
HCT: 45.2 % (ref 36.0–46.0)
Hemoglobin: 14.6 g/dL (ref 12.0–15.0)
MCHC: 32.4 g/dL (ref 30.0–36.0)
MCV: 90.5 fL (ref 78.0–100.0)
Platelets: 192 10*3/uL (ref 150.0–400.0)
RBC: 5 Mil/uL (ref 3.87–5.11)
RDW: 14.3 % (ref 11.5–15.5)
WBC: 6.4 10*3/uL (ref 4.0–10.5)

## 2022-12-17 NOTE — Patient Instructions (Signed)
Your provider has requested that you go to the basement level for lab work before leaving today. Press "B" on the elevator. The lab is located at the first door on the left as you exit the elevator.  Due to recent changes in healthcare laws, you may see the results of your imaging and laboratory studies on MyChart before your provider has had a chance to review them.  We understand that in some cases there may be results that are confusing or concerning to you. Not all laboratory results come back in the same time frame and the provider may be waiting for multiple results in order to interpret others.  Please give [place] 48 hours in order for your provider to thoroughly review all the results before contacting the office for clarification of your results.   _______________________________________________________  If your blood pressure at your visit was 140/90 or greater, please contact your primary care physician to follow up on this.  _______________________________________________________  If you are age 20 or older, your body mass index should be between 23-30. Your Body mass index is 36.73 kg/m. If this is out of the aforementioned range listed, please consider follow up with your Primary Care Provider.  If you are age 36 or younger, your body mass index should be between 19-25. Your Body mass index is 36.73 kg/m. If this is out of the aformentioned range listed, please consider follow up with your Primary Care Provider.   ________________________________________________________  The [place] [facility] providers would like to encourage you to use [facility] to communicate with providers for non-urgent requests or questions.  Due to long hold times on the telephone, sending your provider a message by [facility] may be a faster and more efficient way to get a response.  Please allow 48 business hours for a response.  Please remember that this is for non-urgent requests.   _______________________________________________________  Thank you for choosing me and [facility].  Dr. Meridee Score

## 2022-12-17 NOTE — Progress Notes (Unsigned)
GASTROENTEROLOGY OUTPATIENT CLINIC VISIT   Primary Care Provider Ignatius Specking, MD [address] [place] [address] [phone]  Referring Provider Ignatius Specking, MD [address] [place],  [place] [address] [phone]  Patient Profile: [name] is a 72 y.o. female with a pmh significant for  The patient presents to the [facility] for an evaluation and management of problem(s) noted below:  Problem List No diagnosis found.  History of Present Illness    The patient does/does not take NSAIDs or BC/Goody Powder. Patient has/has not had an EGD. Patient has/has not had a Colonoscopy.  GI Review of Systems Positive as above Negative for  Pyrosis; Reflux; Regurgitation; Dysphagia; Odynophagia; Globus; Post-prandial cough; Nocturnal cough; Nasal regurgitation; Epigastric pain; Nausea; Vomiting; Hematemesis; Jaundice; Change in Appetite; Early satiety; Abdominal pain; Abdominal bloating; Eructation; Flatulence; Change in BM Frequency; Change in BM Consistency; Constipation; Diarrhea; Incontinence; Urgency; Tenesmus; Hematochezia; Melena  Review of Systems General: Denies fevers/chills/weight loss/night sweats HEENT: Denies oral lesions/sore throat/headaches/visual changes Cardiovascular: Denies chest pain/palpitations Pulmonary: Denies shortness of breath/cough Gastroenterological: See HPI Genitourinary: Denies darkened urine or hematuria Hematological: Denies easy bruising/bleeding Endocrine: Denies temperature intolerance Dermatological: Denies skin changes Psychological: Mood is stable Allergy & Immunology: Denies severe allergic reactions Musculoskeletal: Denies new arthralgias   Medications Current Outpatient Medications  Medication Sig Dispense Refill   ALPRAZolam (XANAX) 0.5 MG tablet Take 1 tablet (0.5 mg total) by mouth 2 (two) times daily as needed for anxiety. 60 tablet 0   aspirin 81 MG tablet Take 81 mg by mouth daily.     Biotin 5000  MCG CAPS Take 1 capsule by mouth daily.     bumetanide (BUMEX) 1 MG tablet 1 tablet daily.     diphenhydrAMINE (BENADRYL) 50 MG tablet Take one tablet by mouth as directed per CT protocol 1 tablet 0   diphenhydrAMINE HCl (ALLERGY MED PO) Take 1 tablet by mouth daily as needed.     DULoxetine (CYMBALTA) 60 MG capsule 1 capsule daily.     famotidine (PEPCID) 40 MG tablet TAKE 1 TABLET EVERY 12 HOURS 180 tablet 3   Ferrous Sulfate (IRON) 325 (65 Fe) MG TABS 325 mg daily.     levothyroxine (SYNTHROID, LEVOTHROID) 50 MCG tablet TAKE 1 TABLET DAILY BEFORE BREAKFAST. 90 tablet 1   Methylsulfonylmethane (MSM) 1500 MG TABS Take 1 tablet by mouth 2 (two) times daily.     modafinil (PROVIGIL) 200 MG tablet 1 tablet daily.     ondansetron (ZOFRAN) 4 MG tablet Take 1 tablet (4 mg total) by mouth every 8 (eight) hours as needed for nausea or vomiting. 30 tablet 1   PAPAYA ENZYME PO Take by mouth. After meals     pravastatin (PRAVACHOL) 40 MG tablet TAKE 1 TABLET AT BEDTIME FOR CHOLESTEROL (Patient taking differently: Take 40 mg by mouth daily.) 90 tablet 1   predniSONE (DELTASONE) 50 MG tablet Take as directed per CT protocol 3 tablet 0   PROBIOTIC CAPS Take 2 capsules by mouth daily.      sucralfate (CARAFATE) 1 g tablet TAKE 1 TABLET AT BEDTIME. DO NOT TAKE WITHIN 2 HOURS OF OTHER MEDICATIONS 90 tablet 2   Vitamin D, Cholecalciferol, 50 MCG (2000 UT) CAPS Take 1 capsule by mouth daily.     No current facility-administered medications for this visit.    Allergies Allergies  Allergen Reactions   Cefazolin Anaphylaxis   Ace Inhibitors Cough   Enalapril Cough   Quinapril    Trazodone And  Nefazodone    Ivp Dye [Iodinated Contrast Media]     Pain in legs after having CT scan   Penicillin G Rash   Penicillins     Histories Past Medical History:  Diagnosis Date   Anemia    Barrett's esophagus 09/30/2018   EGD July 2020 - short segment, no dysplasia (repeat EGD July 2023)   Cerebrovascular  disease    Depression with anxiety    Diabetes (HCC)    Diverticulosis    Dyslipidemia    Fundic gland polyps of stomach, benign    GERD (gastroesophageal reflux disease)    Hepatomegaly    Hiatal hernia    Hyperlipidemia    Hypertension    Hypothyroidism    Obesity    Obesity    Osteoarthritis    Pre-diabetes    Tracheal stenosis    Tremor    Urinary incontinence    Ventral hernia    Vitamin D deficiency    Past Surgical History:  Procedure Laterality Date   ABDOMINAL HYSTERECTOMY     ABDOMINAL WALL MESH  REMOVAL     ABDOMINAL WALL MESH  REMOVAL     APPENDECTOMY     BREAST REDUCTION SURGERY     COLONOSCOPY     At a hospital. Can't remember   ESOPHAGOGASTRODUODENOSCOPY     serveral times. been years   GALLBLADDER SURGERY     LAPAROSCOPIC GASTRIC BANDING     REDUCTION MAMMAPLASTY     TRACHEAL STENOSIS REPAIR W/ PERFUSION AND MLB     Social History   Socioeconomic History   Marital status: Married    Spouse name: Maisie Fus   Number of children: 1   Years of education: 1-College   Highest education level: Not on file  Occupational History   Occupation: retired  Tobacco Use   Smoking status: Never   Smokeless tobacco: Never  Vaping Use   Vaping status: Never Used  Substance and Sexual Activity   Alcohol use: Yes    Alcohol/week: 0.0 standard drinks of alcohol    Comment: rarely (1 every 2 months)   Drug use: No   Sexual activity: Never  Other Topics Concern   Not on file  Social History Narrative   Lives at home with husband   Patient is right handed.   Patient drinks caffeine occasionally.   Social Determinants of Health   Financial Resource Strain: Not on file  Food Insecurity: Not on file  Transportation Needs: Not on file  Physical Activity: Not on file  Stress: Not on file  Social Connections: Not on file  Intimate Partner Violence: Not on file   Family History  Problem Relation Age of Onset   Heart disease Mother    Heart disease Father     Melanoma Brother    Stroke Son    Diabetes Maternal Aunt    Colon cancer Neg Hx    Esophageal cancer Neg Hx    Breast cancer Neg Hx    Rectal cancer Neg Hx    Stomach cancer Neg Hx    I have reviewed her medical, social, and family history in detail and updated the electronic medical record as necessary.    PHYSICAL EXAMINATION  BP (!) 144/86   Pulse 76   Ht 5\' 1"  (1.549 m)   Wt 194 lb 6.4 oz (88.2 kg)   BMI 36.73 kg/m  Wt Readings from Last 3 Encounters:  12/17/22 194 lb 6.4 oz (88.2 kg)  11/20/22 190 lb (86.2  kg)  10/02/22 190 lb (86.2 kg)   GEN: NAD, appears stated age, doesn't appear chronically ill PSYCH: Cooperative, without pressured speech EYE: Conjunctivae pink, sclerae anicteric ENT: MMM, without oral ulcers, no erythema or exudates noted NECK: Supple CV: RR without R/Gs  RESP: CTAB posteriorly, without wheezing GI: NABS, soft, NT/ND, without rebound or guarding, no HSM appreciated GU: DRE shows MSK/EXT: _ edema, no palmar erythema SKIN: No jaundice, no spider angiomata, no concerning rashes NEURO:  Alert & Oriented x 3, no focal deficits, no evidence of asterixis   REVIEW OF DATA  I reviewed the following data at the time of this encounter:  GI Procedures and Studies  ***  Laboratory Studies  ***  Imaging Studies  ***   ASSESSMENT  Ms. Spires is a 72 y.o. female with a pmh significant for The patient is seen today for evaluation and management of:  No diagnosis found.  ***   PLAN  There are no diagnoses linked to this encounter.   No orders of the defined types were placed in this encounter.   New Prescriptions   No medications on file   Modified Medications   No medications on file    Planned Follow Up No follow-ups on file.   Total Time in Face-to-Face and in Coordination of Care for patient including independent/personal interpretation/review of prior testing, medical history, examination, medication adjustment,  communicating results with the patient directly, and documentation within the EHR is ***.   Corliss Parish, MD [facility] Office # [phone]

## 2022-12-18 ENCOUNTER — Encounter: Payer: Self-pay | Admitting: Gastroenterology

## 2022-12-18 DIAGNOSIS — Z860101 Personal history of adenomatous and serrated colon polyps: Secondary | ICD-10-CM | POA: Insufficient documentation

## 2022-12-18 DIAGNOSIS — D128 Benign neoplasm of rectum: Secondary | ICD-10-CM | POA: Insufficient documentation

## 2022-12-22 ENCOUNTER — Encounter (HOSPITAL_COMMUNITY): Payer: Self-pay | Admitting: Gastroenterology

## 2022-12-29 ENCOUNTER — Ambulatory Visit (HOSPITAL_COMMUNITY)
Admission: RE | Admit: 2022-12-29 | Discharge: 2022-12-29 | Disposition: A | Discharge status: Home / Self Care | Payer: Medicare HMO | Attending: Gastroenterology | Admitting: Gastroenterology

## 2022-12-29 ENCOUNTER — Ambulatory Visit (HOSPITAL_COMMUNITY): Payer: Medicare HMO | Admitting: Anesthesiology

## 2022-12-29 ENCOUNTER — Encounter (HOSPITAL_COMMUNITY)
Admission: RE | Disposition: A | Discharge status: Home / Self Care | Payer: Self-pay | Source: Home / Self Care | Attending: Gastroenterology

## 2022-12-29 ENCOUNTER — Other Ambulatory Visit: Payer: Self-pay

## 2022-12-29 ENCOUNTER — Encounter (HOSPITAL_COMMUNITY): Payer: Self-pay | Admitting: Gastroenterology

## 2022-12-29 DIAGNOSIS — D128 Benign neoplasm of rectum: Secondary | ICD-10-CM | POA: Insufficient documentation

## 2022-12-29 DIAGNOSIS — I10 Essential (primary) hypertension: Secondary | ICD-10-CM | POA: Insufficient documentation

## 2022-12-29 DIAGNOSIS — K635 Polyp of colon: Secondary | ICD-10-CM

## 2022-12-29 DIAGNOSIS — F419 Anxiety disorder, unspecified: Secondary | ICD-10-CM | POA: Insufficient documentation

## 2022-12-29 DIAGNOSIS — K5730 Diverticulosis of large intestine without perforation or abscess without bleeding: Secondary | ICD-10-CM | POA: Insufficient documentation

## 2022-12-29 DIAGNOSIS — D124 Benign neoplasm of descending colon: Secondary | ICD-10-CM | POA: Diagnosis not present

## 2022-12-29 DIAGNOSIS — Z860109 Personal history of other colon polyps: Secondary | ICD-10-CM | POA: Insufficient documentation

## 2022-12-29 DIAGNOSIS — K449 Diaphragmatic hernia without obstruction or gangrene: Secondary | ICD-10-CM | POA: Insufficient documentation

## 2022-12-29 DIAGNOSIS — Z860100 Personal history of colon polyps, unspecified: Secondary | ICD-10-CM

## 2022-12-29 DIAGNOSIS — K641 Second degree hemorrhoids: Secondary | ICD-10-CM | POA: Diagnosis not present

## 2022-12-29 DIAGNOSIS — K219 Gastro-esophageal reflux disease without esophagitis: Secondary | ICD-10-CM | POA: Diagnosis not present

## 2022-12-29 DIAGNOSIS — K644 Residual hemorrhoidal skin tags: Secondary | ICD-10-CM | POA: Diagnosis not present

## 2022-12-29 DIAGNOSIS — D125 Benign neoplasm of sigmoid colon: Secondary | ICD-10-CM | POA: Diagnosis not present

## 2022-12-29 DIAGNOSIS — M1990 Unspecified osteoarthritis, unspecified site: Secondary | ICD-10-CM | POA: Diagnosis not present

## 2022-12-29 DIAGNOSIS — Z09 Encounter for follow-up examination after completed treatment for conditions other than malignant neoplasm: Secondary | ICD-10-CM | POA: Insufficient documentation

## 2022-12-29 DIAGNOSIS — F32A Depression, unspecified: Secondary | ICD-10-CM | POA: Diagnosis not present

## 2022-12-29 DIAGNOSIS — K6289 Other specified diseases of anus and rectum: Secondary | ICD-10-CM

## 2022-12-29 DIAGNOSIS — Z860101 Personal history of adenomatous and serrated colon polyps: Secondary | ICD-10-CM | POA: Diagnosis not present

## 2022-12-29 HISTORY — PX: POLYPECTOMY: SHX5525

## 2022-12-29 HISTORY — PX: SUBMUCOSAL LIFTING INJECTION: SHX6855

## 2022-12-29 HISTORY — PX: HEMOSTASIS CLIP PLACEMENT: SHX6857

## 2022-12-29 HISTORY — PX: COLONOSCOPY WITH PROPOFOL: SHX5780

## 2022-12-29 HISTORY — PX: ENDOSCOPIC MUCOSAL RESECTION: SHX6839

## 2022-12-29 SURGERY — COLONOSCOPY WITH PROPOFOL
Anesthesia: Monitor Anesthesia Care

## 2022-12-29 MED ORDER — PHENYLEPHRINE 80 MCG/ML (10ML) SYRINGE FOR IV PUSH (FOR BLOOD PRESSURE SUPPORT)
PREFILLED_SYRINGE | INTRAVENOUS | Status: DC | PRN
  Administered 2022-12-29: 80 ug via INTRAVENOUS

## 2022-12-29 MED ORDER — SODIUM CHLORIDE 0.9 % IV SOLN: INTRAVENOUS | Status: DC

## 2022-12-29 MED ORDER — LIDOCAINE HCL URETHRAL/MUCOSAL 2 % EX GEL
CUTANEOUS | Status: AC
  Filled 2022-12-29: qty 30

## 2022-12-29 MED ORDER — SODIUM CHLORIDE 0.9 % IV SOLN: INTRAVENOUS | Status: DC | PRN

## 2022-12-29 MED ORDER — LIDOCAINE HCL (PF) 2 % IJ SOLN
INTRAMUSCULAR | Status: DC | PRN
  Administered 2022-12-29: 60 mg via INTRADERMAL

## 2022-12-29 MED ORDER — LACTATED RINGERS IV SOLN: INTRAVENOUS | Status: DC

## 2022-12-29 MED ORDER — PROPOFOL 500 MG/50ML IV EMUL
INTRAVENOUS | Status: DC | PRN
  Administered 2022-12-29: 150 ug/kg/min via INTRAVENOUS
  Administered 2022-12-29: 50 mg via INTRAVENOUS

## 2022-12-29 MED ORDER — METHYLENE BLUE (ANTIDOTE) 1 % IV SOLN
INTRAVENOUS | Status: AC
Start: 2022-12-29 — End: ?
  Filled 2022-12-29: qty 10

## 2022-12-29 SURGICAL SUPPLY — 22 items
ELECT REM PT RETURN 9FT ADLT (ELECTROSURGICAL)
ELECTRODE REM PT RTRN 9FT ADLT (ELECTROSURGICAL) IMPLANT
FCP BXJMBJMB 240X2.8X (CUTTING FORCEPS)
FLOOR PAD 36X40 (MISCELLANEOUS) ×2
FORCEPS BIOP RAD 4 LRG CAP 4 (CUTTING FORCEPS) IMPLANT
FORCEPS BIOP RJ4 240 W/NDL (CUTTING FORCEPS)
FORCEPS BXJMBJMB 240X2.8X (CUTTING FORCEPS) IMPLANT
INJECTOR/SNARE I SNARE (MISCELLANEOUS) IMPLANT
LUBRICANT JELLY 4.5OZ STERILE (MISCELLANEOUS) IMPLANT
MANIFOLD NEPTUNE II (INSTRUMENTS) IMPLANT
NDL SCLEROTHERAPY 25GX240 (NEEDLE) IMPLANT
NEEDLE SCLEROTHERAPY 25GX240 (NEEDLE)
PAD FLOOR 36X40 (MISCELLANEOUS) ×3 IMPLANT
PROBE APC STR FIRE (PROBE) IMPLANT
PROBE INJECTION GOLD (MISCELLANEOUS)
PROBE INJECTION GOLD 7FR (MISCELLANEOUS) IMPLANT
SNARE ROTATE MED OVAL 20MM (MISCELLANEOUS) IMPLANT
SYR 50ML LL SCALE MARK (SYRINGE) IMPLANT
TRAP SPECIMEN MUCOUS 40CC (MISCELLANEOUS) IMPLANT
TUBING ENDO SMARTCAP PENTAX (MISCELLANEOUS) IMPLANT
TUBING IRRIGATION ENDOGATOR (MISCELLANEOUS) ×3 IMPLANT
WATER STERILE IRR 1000ML POUR (IV SOLUTION) IMPLANT

## 2022-12-29 NOTE — H&P (Signed)
GASTROENTEROLOGY PROCEDURE H&P NOTE   Primary Care Physician: Wendall Papa  HPI: [name] is a 72 y.o. female who presents for Colonoscopy for attempt at EMR of Rectal TVA and completion colonoscopy if possible.  Past Medical History:  Diagnosis Date   Anemia    Barrett's esophagus 09/30/2018   EGD July 2020 - short segment, no dysplasia (repeat EGD July 2023)   Cerebrovascular disease    Depression with anxiety    Diabetes (HCC)    Diverticulosis    Dyslipidemia    Fundic gland polyps of stomach, benign    GERD (gastroesophageal reflux disease)    Hepatomegaly    Hiatal hernia    Hyperlipidemia    Hypertension    Hypothyroidism    Obesity    Obesity    Osteoarthritis    Pre-diabetes    Tracheal stenosis    Tremor    Urinary incontinence    Ventral hernia    Vitamin D deficiency    Past Surgical History:  Procedure Laterality Date   ABDOMINAL HYSTERECTOMY     ABDOMINAL WALL MESH  REMOVAL     ABDOMINAL WALL MESH  REMOVAL     APPENDECTOMY     BREAST REDUCTION SURGERY     COLONOSCOPY     At a hospital. Can't remember   ESOPHAGOGASTRODUODENOSCOPY     serveral times. been years   GALLBLADDER SURGERY     LAPAROSCOPIC GASTRIC BANDING     REDUCTION MAMMAPLASTY     TRACHEAL STENOSIS REPAIR W/ PERFUSION AND MLB     No current facility-administered medications for this encounter.   No current facility-administered medications for this encounter. Allergies  Allergen Reactions   Cefazolin Anaphylaxis   Ace Inhibitors Cough   Enalapril Cough   Quinapril    Trazodone And Nefazodone    Ivp Dye [Iodinated Contrast Media]     Pain in legs after having CT scan   Penicillin G Rash   Penicillins    Family History  Problem Relation Age of Onset   Heart disease Mother    Heart disease Father    Melanoma Brother    Diabetes Maternal Aunt    Stroke Son    Colon cancer Neg Hx    Esophageal cancer Neg Hx    Breast cancer Neg Hx    Rectal cancer Neg Hx     Stomach cancer Neg Hx    Inflammatory bowel disease Neg Hx    Liver disease Neg Hx    Pancreatic cancer Neg Hx    Social History   Socioeconomic History   Marital status: Married    Spouse name: Maisie Fus   Number of children: 1   Years of education: 1-College   Highest education level: Not on file  Occupational History   Occupation: retired  Tobacco Use   Smoking status: Never   Smokeless tobacco: Never  Vaping Use   Vaping status: Never Used  Substance and Sexual Activity   Alcohol use: Yes    Alcohol/week: 0.0 standard drinks of alcohol    Comment: rarely (1 every 2 months)   Drug use: No   Sexual activity: Never  Other Topics Concern   Not on file  Social History Narrative   Lives at home with husband   Patient is right handed.   Patient drinks caffeine occasionally.   Social Determinants of Health   Financial Resource Strain: Not on file  Food Insecurity: Not on file  Transportation Needs: Not on  file  Physical Activity: Not on file  Stress: Not on file  Social Connections: Not on file  Intimate Partner Violence: Not on file    Physical Exam: Today's Vitals   12/29/22 0900  BP: (!) 160/76  Pulse: 77  Resp: (!) 25  Temp: (!) 97.2 F (36.2 C)  TempSrc: Tympanic  SpO2: 96%  Weight: 88.5 kg  Height: 5\' 1"  (1.549 m)  PainSc: 0-No pain   Body mass index is 36.84 kg/m. GEN: NAD EYE: Sclerae anicteric ENT: MMM CV: Non-tachycardic GI: Soft, NT/ND NEURO:  Alert & Oriented x 3  Lab Results: No results for input(s): "WBC", "HGB", "HCT", "PLT" in the last 72 hours. BMET No results for input(s): "NA", "K", "CL", "CO2", "GLUCOSE", "BUN", "CREATININE", "CALCIUM" in the last 72 hours. LFT No results for input(s): "PROT", "ALBUMIN", "AST", "ALT", "ALKPHOS", "BILITOT", "BILIDIR", "IBILI" in the last 72 hours. PT/INR No results for input(s): "LABPROT", "INR" in the last 72 hours.   Impression / Plan: This is a 72 y.o.female who presents for Colonoscopy  for attempt at EMR of Rectal TVA and completion colonoscopy if possible.  Based upon the description and endoscopic pictures I do feel that it is reasonable to pursue an Advanced Polypectomy attempt of the polyp/lesion.  We discussed some of the techniques of advanced polypectomy which include Endoscopic Mucosal Resection, OVESCO Full-Thickness Resection, Endorotor Morcellation, and Tissue Ablation via Fulguration.  We also reviewed images of typical techniques as noted above.  The risks and benefits of endoscopic evaluation were discussed with the patient; these include but are not limited to the risk of perforation, infection, bleeding, missed lesions, lack of diagnosis, severe illness requiring hospitalization, as well as anesthesia and sedation related illnesses.  During attempts at advanced resection, the risks of bleeding and perforation/leak are increased as opposed to diagnostic and screening procedures, and that was discussed with the patient as well.   In addition, I explained that with the possible need for piecemeal resection, subsequent short-interval endoscopic evaluation for follow up and potential retreatment of the lesion/area may be necessary.  I did offer, a referral to surgery in order for patient to have opportunity to discuss surgical management/intervention prior to finalizing decision for attempt at endoscopic removal, however, the patient deferred on this.  If, after attempt at removal of the polyp/lesion, it is found that the patient has a complication or that an invasive lesion or malignant lesion is found, or that the polyp/lesion continues to recur, the patient is aware and understands that surgery may still be indicated/required.  All patient questions were answered, to the best of my ability, and the patient agrees to the aforementioned plan of action with follow-up as indicated.  The risks and benefits of endoscopic evaluation/treatment were discussed with the patient and/or  family; these include but are not limited to the risk of perforation, infection, bleeding, missed lesions, lack of diagnosis, severe illness requiring hospitalization, as well as anesthesia and sedation related illnesses.  The patient's history has been reviewed, patient examined, no change in status, and deemed stable for procedure.  The patient and/or family is agreeable to proceed.    Corliss Parish, MD [facility] Office # [phone]

## 2022-12-29 NOTE — Anesthesia Preprocedure Evaluation (Addendum)
Anesthesia Evaluation  Patient identified by MRN, date of birth, ID band Patient awake    Reviewed: Allergy & Precautions, NPO status , Patient's Chart, lab work & pertinent test results  Airway Mallampati: III  TM Distance: >3 FB Neck ROM: Full    Dental  (+) Dental Advisory Given, Teeth Intact   Pulmonary neg shortness of breath, neg sleep apnea, neg COPD, neg recent URI   breath sounds clear to auscultation       Cardiovascular hypertension, (-) angina (-) Past MI and (-) CHF  Rhythm:Regular     Neuro/Psych  PSYCHIATRIC DISORDERS Anxiety Depression     Neuromuscular disease    GI/Hepatic hiatal hernia,GERD  Medicated,,  Endo/Other  diabetesHypothyroidism    Renal/GU Lab Results      Component                Value               Date                      NA                       140                 01/15/2021                K                        4.0                 01/15/2021                CO2                      31                  01/15/2021                GLUCOSE                  118 (H)             01/15/2021                BUN                      18                  11/24/2022                CREATININE               1.00                11/24/2022                CALCIUM                  9.7                 01/15/2021                GFR                      55.49 (L)           01/15/2021  GFRNONAA                 69                  01/16/2015                Musculoskeletal  (+) Arthritis ,    Abdominal   Peds  Hematology negative hematology ROS (+) Lab Results      Component                Value               Date                      WBC                      6.4                 12/17/2022                HGB                      14.6                12/17/2022                HCT                      45.2                12/17/2022                MCV                      90.5                 12/17/2022                PLT                      192.0               12/17/2022              Anesthesia Other Findings   Reproductive/Obstetrics                             Anesthesia Physical Anesthesia Plan  ASA: 2  Anesthesia Plan: MAC   Post-op Pain Management: Minimal or no pain anticipated   Induction: Intravenous  PONV Risk Score and Plan: 2 and Propofol infusion and Treatment may vary due to age or medical condition  Airway Management Planned: Nasal Cannula, Natural Airway and Simple Face Mask  Additional Equipment: None  Intra-op Plan:   Post-operative Plan:   Informed Consent: I have reviewed the patients History and Physical, chart, labs and discussed the procedure including the risks, benefits and alternatives for the proposed anesthesia with the patient or authorized representative who has indicated his/her understanding and acceptance.     Dental advisory given  Plan Discussed with: CRNA  Anesthesia Plan Comments:         Anesthesia Quick Evaluation

## 2022-12-29 NOTE — Discharge Instructions (Signed)
YOU HAD AN ENDOSCOPIC PROCEDURE TODAY: Refer to the procedure report and other information in the discharge instructions given to you for any specific questions about what was found during the examination. If this information does not answer your questions, please call [place] office at [phone] to clarify.   YOU SHOULD EXPECT: Some feelings of bloating in the abdomen. Passage of more gas than usual. Walking can help get rid of the air that was put into your GI tract during the procedure and reduce the bloating. If you had a lower endoscopy (such as a colonoscopy or flexible sigmoidoscopy) you may notice spotting of blood in your stool or on the toilet paper. Some abdominal soreness may be present for a day or two, also.  DIET: Your first meal following the procedure should be a light meal and then it is ok to progress to your normal diet. A half-sandwich or bowl of soup is an example of a good first meal. Heavy or fried foods are harder to digest and may make you feel nauseous or bloated. Drink plenty of fluids but you should avoid alcoholic beverages for 24 hours.  ACTIVITY: Your care partner should take you home directly after the procedure. You should plan to take it easy, moving slowly for the rest of the day. You can resume normal activity the day after the procedure however YOU SHOULD NOT DRIVE, use power tools, machinery or perform tasks that involve climbing or major physical exertion for 24 hours (because of the sedation medicines used during the test).   SYMPTOMS TO REPORT IMMEDIATELY: A gastroenterologist can be reached at any hour. Please call [phone]  for any of the following symptoms:  Following lower endoscopy (colonoscopy, flexible sigmoidoscopy) Excessive amounts of blood in the stool  Significant tenderness, worsening of abdominal pains  Swelling of the abdomen that is new, acute  Fever of 100 or higher  FOLLOW UP:  If any biopsies were taken you will be contacted by  phone or by letter within the next 1-3 weeks. Call [phone]  if you have not heard about the biopsies in 3 weeks.  Please also call with any specific questions about appointments or follow up tests.

## 2022-12-29 NOTE — Op Note (Signed)
[facility] Patient Name: [name] Procedure Date: 12/29/2022 MRN: [id number] Attending MD: Corliss Parish , MD, [id number] Date of Birth: 22-Jan-1951 CSN: [id number] Age: 72 Admit Type: Outpatient Procedure:                Colonoscopy Indications:              Excision of colonic polyp Providers:                Corliss Parish, MD, Fransisca Connors, Kandice Robinsons, Technician Referring MD:             [name]. Pyrtle, MD, Judd Gaudier. Hairfield Medicines:                Monitored Anesthesia Care Complications:            No immediate complications. Estimated Blood Loss:     Estimated blood loss was minimal. Procedure:                Pre-Anesthesia Assessment:                           - Prior to the procedure, a History and Physical                            was performed, and patient medications and                            allergies were reviewed. The patient's tolerance of                            previous anesthesia was also reviewed. The risks                            and benefits of the procedure and the sedation                            options and risks were discussed with the patient.                            All questions were answered, and informed consent                            was obtained. Prior Anticoagulants: The patient has                            taken no anticoagulant or antiplatelet agents                            except for aspirin. ASA Grade Assessment: III - A                            patient with severe systemic disease. After  reviewing the risks and benefits, the patient was                            deemed in satisfactory condition to undergo the                            procedure.                           After obtaining informed consent, the colonoscope                            was passed under direct vision. Throughout the                             procedure, the patient's blood pressure, pulse, and                            oxygen saturations were monitored continuously. The                            PCF-HQ190L ([id number]) Olympus colonoscope was                            introduced through the anus and advanced to the 3                            cm into the ileum. The colonoscopy was performed                            without difficulty. The patient tolerated the                            procedure. The quality of the bowel preparation was                            adequate. The terminal ileum, ileocecal valve,                            appendiceal orifice, and rectum were photographed. Scope In: 10:39:57 AM Scope Out: 11:16:09 AM Scope Withdrawal Time: 0 hours 32 minutes 34 seconds  Total Procedure Duration: 0 hours 36 minutes 12 seconds  Findings:      Skin tags were found on perianal exam.      The digital rectal exam findings include palpable rectal lesion and       hemorrhoids.      The terminal ileum and ileocecal valve appeared normal.      A large amount of semi-liquid stool was found in the entire colon,       interfering with visualization. Lavage of the area was performed using       copious amounts, resulting in clearance with adequate visualization.      Multiple small-mouthed diverticula were found in the recto-sigmoid colon       and sigmoid colon.      Two sessile polyps were found in the sigmoid  colon and descending colon.       The polyps were 3 to 5 mm in size. These polyps were removed with a cold       snare. Resection and retrieval were complete.      A 35 mm polyp was found in the distal rectum. The polyp was granular       lateral spreading. Preparations were made for mucosal resection.       Demarcation of the lesion was performed with high-definition white light       and narrow band imaging to clearly identify the boundaries of the       lesion. EverLift was injected to raise the lesion. Snare  mucosal       resection was performed. Resection and retrieval were complete. Resected       tissue margins were examined and clear of polyp tissue. Coagulation for       tissue destruction to the margin using snare tip soft coagulation was       successful. To prevent bleeding after mucosal resection, six hemostatic       clips were successfully placed (MR conditional). There was no bleeding       during, or at the end, of the procedure.      Non-bleeding non-thrombosed external and internal hemorrhoids were found       during retroflexion, during perianal exam and during digital exam. The       hemorrhoids were Grade II (internal hemorrhoids that prolapse but reduce       spontaneously). Impression:               - Perianal skin tags found on perianal exam.                           - Palpable rectal lesion and hemorrhoids found on                            digital rectal exam.                           - The examined portion of the ileum was normal.                           - Stool in the entire examined colon - lavaged with                            adequate visualization.                           - Diverticulosis in the recto-sigmoid colon and in                            the sigmoid colon.                           - Two 3 to 5 mm polyps in the sigmoid colon and in                            the descending colon, removed with a cold snare.  Resected and retrieved.                           - One 35 mm polyp in the distal rectum, removed                            with mucosal resection. Resected and retrieved.                            Treated with STSC to margin. Clips (MR conditional)                            were placed.                           - Non-bleeding non-thrombosed external and internal                            hemorrhoids. Moderate Sedation:      Not Applicable - Patient had care per Anesthesia. Recommendation:           - The  patient will be observed post-procedure,                            until all discharge criteria are met.                           - Discharge patient to home.                           - Patient has a contact number available for                            emergencies. The signs and symptoms of potential                            delayed complications were discussed with the                            patient. Return to normal activities tomorrow.                            Written discharge instructions were provided to the                            patient.                           - High fiber diet.                           - Use FiberCon 1-2 tablets PO daily.                           - No ibuprofen, naproxen, or other non-steroidal  anti-inflammatory drugs for 1 week.                           - Continue present medications.                           - Await pathology results.                           - Repeat colonoscopy versus flex sigmoidoscopy                            within 1 year for surveillance.                           - The findings and recommendations were discussed                            with the patient.                           - The findings and recommendations were discussed                            with the patient's family. Procedure Code(s):        --- Professional ---                           [phone], Colonoscopy, flexible; with endoscopic                            mucosal resection                           [phone], 59, Colonoscopy, flexible; with removal of                            tumor(s), polyp(s), or other lesion(s) by snare                            technique Diagnosis Code(s):        --- Professional ---                           K64.9, Unspecified hemorrhoids                           D12.5, Benign neoplasm of sigmoid colon                           D12.4, Benign neoplasm of descending colon                            D12.8, Benign neoplasm of rectum                           K64.4, Residual hemorrhoidal skin tags  K62.89, Other specified diseases of anus and rectum                           K63.5, Polyp of colon                           K57.30, Diverticulosis of large intestine without                            perforation or abscess without bleeding CPT copyright 2022 American Medical Association. All rights reserved. The codes documented in this report are preliminary and upon coder review may  be revised to meet current compliance requirements. Corliss Parish, MD 12/29/2022 11:34:07 AM Number of Addenda: 0

## 2022-12-29 NOTE — Transfer of Care (Signed)
Immediate Anesthesia Transfer of Care Note  Patient: [name]  Procedure(s) Performed: COLONOSCOPY WITH PROPOFOL ENDOSCOPIC MUCOSAL RESECTION POLYPECTOMY SUBMUCOSAL LIFTING INJECTION  Patient Location: PACU  Anesthesia Type:MAC  Level of Consciousness: awake, alert , oriented, and patient cooperative  Airway & Oxygen Therapy: Patient Spontanous Breathing and Patient connected to face mask oxygen  Post-op Assessment: Report given to RN and Post -op Vital signs reviewed and stable  Post vital signs: Reviewed and stable  Last Vitals:  Vitals Value Taken Time  BP 139/81 12/29/22 1125  Temp    Pulse 74 12/29/22 1125  Resp 14 12/29/22 1125  SpO2 98 % 12/29/22 1125    Last Pain:  Vitals:   12/29/22 1125  TempSrc:   PainSc: 0-No pain         Complications: No notable events documented.

## 2022-12-30 LAB — SURGICAL PATHOLOGY

## 2022-12-30 NOTE — Progress Notes (Signed)
Post-procedure call performed with patient. Pt states she had nausea and overall achiness post-procedure for approximately 5 hours which she compares to having the flu. She states this was relieved by taking anti-nausea medications at home and has not recurred. Advised pt to reach out to GI physician's office if symptoms recur. Pt demonstrated understanding. Staff message sent to MD Mansouraty detailing conversation.  Eulas Post, RN 12/30/22 4:11 PM

## 2023-01-01 ENCOUNTER — Encounter: Payer: Self-pay | Admitting: Gastroenterology

## 2023-01-01 ENCOUNTER — Telehealth: Payer: Self-pay

## 2023-01-01 NOTE — Telephone Encounter (Signed)
-----   Message from [facility] sent at 01/01/2023  2:22 PM EDT ----- Regarding: RE: Pt reports nausea post-colonoscopy [facility], Thanks for letting me know.  Derhonda Eastlick, Can you followup on this patient? Thanks. GM ----- Message ----- From: Eulas Post, RN Sent: 12/30/2022   4:01 PM EDT To: Lemar Lofty., MD Subject: Pt reports nausea post-colonoscopy             Good afternoon Dr Meridee Score,  I just spoke with Mrs. Blades on the phone. She states that she had some nausea and overall achy feeling after her procedure yesterday (which she compares to having the flu) that persisted for approximately 5 hours, relieved by anti-nausea medication at home. Has not recurred since. She questions whether it was related to the anesthesia she was given (per the [facility], she was given lidocaine and propofol). I advised her to call your office if it recurred. Just wanted to reach out and let you know as well.   Thanks, Casimiro Needle

## 2023-01-02 ENCOUNTER — Encounter (HOSPITAL_COMMUNITY): Payer: Self-pay | Admitting: Gastroenterology

## 2023-01-02 NOTE — Anesthesia Postprocedure Evaluation (Signed)
Anesthesia Post Note  Patient: [name]  Procedure(s) Performed: COLONOSCOPY WITH PROPOFOL ENDOSCOPIC MUCOSAL RESECTION POLYPECTOMY SUBMUCOSAL LIFTING INJECTION HEMOSTASIS CLIP PLACEMENT     Patient location during evaluation: Endoscopy Anesthesia Type: MAC Level of consciousness: awake and alert Pain management: pain level controlled Vital Signs Assessment: post-procedure vital signs reviewed and stable Respiratory status: spontaneous breathing, nonlabored ventilation and respiratory function stable Cardiovascular status: stable and blood pressure returned to baseline Postop Assessment: no apparent nausea or vomiting Anesthetic complications: no   No notable events documented.  Last Vitals:  Vitals:   12/29/22 1130 12/29/22 1140  BP: (!) 119/94 110/73  Pulse: 70 71  Resp: 14 19  Temp:    SpO2: 100% 97%    Last Pain:  Vitals:   12/29/22 1140  TempSrc:   PainSc: 0-No pain                 Jaiyon Wander

## 2023-01-02 NOTE — Telephone Encounter (Signed)
Spoke to the pt and she tells me that she is doing very well. NO symptoms or concerns at this time. She will call if things change.

## 2023-01-20 DIAGNOSIS — X32XXXD Exposure to sunlight, subsequent encounter: Secondary | ICD-10-CM | POA: Diagnosis not present

## 2023-01-20 DIAGNOSIS — Z1283 Encounter for screening for malignant neoplasm of skin: Secondary | ICD-10-CM | POA: Diagnosis not present

## 2023-01-20 DIAGNOSIS — Z299 Encounter for prophylactic measures, unspecified: Secondary | ICD-10-CM | POA: Diagnosis not present

## 2023-01-20 DIAGNOSIS — Z0000 Encounter for general adult medical examination without abnormal findings: Secondary | ICD-10-CM | POA: Diagnosis not present

## 2023-01-20 DIAGNOSIS — E039 Hypothyroidism, unspecified: Secondary | ICD-10-CM | POA: Diagnosis not present

## 2023-01-20 DIAGNOSIS — I700 Atherosclerosis of aorta: Secondary | ICD-10-CM | POA: Diagnosis not present

## 2023-01-20 DIAGNOSIS — F339 Major depressive disorder, recurrent, unspecified: Secondary | ICD-10-CM | POA: Diagnosis not present

## 2023-01-20 DIAGNOSIS — L570 Actinic keratosis: Secondary | ICD-10-CM | POA: Diagnosis not present

## 2023-01-20 DIAGNOSIS — I10 Essential (primary) hypertension: Secondary | ICD-10-CM | POA: Diagnosis not present

## 2023-01-20 DIAGNOSIS — D225 Melanocytic nevi of trunk: Secondary | ICD-10-CM | POA: Diagnosis not present

## 2023-01-29 ENCOUNTER — Telehealth: Payer: Self-pay

## 2023-01-29 ENCOUNTER — Telehealth: Payer: Self-pay | Admitting: *Deleted

## 2023-01-29 DIAGNOSIS — I10 Essential (primary) hypertension: Secondary | ICD-10-CM

## 2023-01-29 NOTE — Progress Notes (Signed)
  Care Coordination   Note   01/29/2023 Name: [name] MRN: [id number] DOB: 1950/09/26  [name] is a 72 y.o. year old female who sees [place], Dan Maker C for primary care. I reached out to Morene Antu by phone today to offer care coordination services.  Ms. Slavey was given information about Care Coordination services today including:   The Care Coordination services include support from the care team which includes your Nurse Coordinator, Clinical Social Worker, or Pharmacist.  The Care Coordination team is here to help remove barriers to the health concerns and goals most important to you. Care Coordination services are voluntary, and the patient may decline or stop services at any time by request to their care team member.   Care Coordination Consent Status: Patient agreed to services and verbal consent obtained.   Follow up plan:  Telephone appointment with care coordination team member scheduled for:  12/2  Encounter Outcome:  Patient Scheduled  [facility] Coordination Care Guide  Direct Dial: [phone]

## 2023-02-02 DIAGNOSIS — M810 Age-related osteoporosis without current pathological fracture: Secondary | ICD-10-CM | POA: Diagnosis not present

## 2023-02-03 DIAGNOSIS — H52223 Regular astigmatism, bilateral: Secondary | ICD-10-CM | POA: Diagnosis not present

## 2023-02-03 DIAGNOSIS — H2513 Age-related nuclear cataract, bilateral: Secondary | ICD-10-CM | POA: Diagnosis not present

## 2023-02-03 DIAGNOSIS — H5203 Hypermetropia, bilateral: Secondary | ICD-10-CM | POA: Diagnosis not present

## 2023-02-03 DIAGNOSIS — H524 Presbyopia: Secondary | ICD-10-CM | POA: Diagnosis not present

## 2023-02-09 ENCOUNTER — Ambulatory Visit: Payer: Self-pay

## 2023-02-09 NOTE — Patient Instructions (Signed)
Visit Information  Thank you for taking time to visit with me today. Please don't hesitate to contact me if I can be of assistance to you.   Following are the goals we discussed today:  Patient agreed to contact food bank list.    If you are experiencing a Mental Health or Behavioral Health Crisis or need someone to talk to, please call 911  Patient verbalizes understanding of instructions and care plan provided today and agrees to view in MyChart. Active MyChart status and patient understanding of how to access instructions and care plan via MyChart confirmed with patient.     No further follow up required: Patient does not request a follow up visit.  Lysle Morales, BSW Social Worker [phone]

## 2023-02-09 NOTE — Patient Outreach (Signed)
  Care Coordination   Initial Visit Note   02/09/2023 Name: [name] MRN: [id number] DOB: 06/15/1950  [name] is a 72 y.o. year old female who sees [place], Dan Maker C for primary care. I spoke with  Morene Antu by phone today.  What matters to the patients health and wellness today?  Patient request information on financial options.    Goals Addressed             This Visit's Progress    Care Coordination Activities       Interventions Today    Flowsheet Row Most Recent Value  Chronic Disease   Chronic disease during today's visit Hypertension (HTN)  General Interventions   General Interventions Discussed/Reviewed General Interventions Discussed, General Interventions Reviewed, Communication with  [Pt struggles with food.  Household income 2527. Over income for Foodstamps. Sw will email a list of food banks in the community.]  Communication with --  [SW t/c [employer] and after a long hold, the only benefit pt is eligible to receive is the $50 OTC option.]              SDOH assessments and interventions completed:  Yes  SDOH Interventions Today    Flowsheet Row Most Recent Value  SDOH Interventions   Food Insecurity Interventions Intervention Not Indicated, Other (Comment)  [Provided a food bank list]  Housing Interventions Intervention Not Indicated  Transportation Interventions Intervention Not Indicated, Other (Comment)  [Drives]  Utilities Interventions Intervention Not Indicated        Care Coordination Interventions:  Yes, provided   Follow up plan: No further intervention required.   Encounter Outcome:  Patient Visit Completed

## 2023-02-26 ENCOUNTER — Other Ambulatory Visit: Payer: Self-pay | Admitting: Internal Medicine

## 2023-03-19 ENCOUNTER — Encounter: Payer: Self-pay | Admitting: Internal Medicine

## 2023-04-14 DIAGNOSIS — D509 Iron deficiency anemia, unspecified: Secondary | ICD-10-CM | POA: Diagnosis not present

## 2023-04-14 DIAGNOSIS — E039 Hypothyroidism, unspecified: Secondary | ICD-10-CM | POA: Diagnosis not present

## 2023-04-14 DIAGNOSIS — F339 Major depressive disorder, recurrent, unspecified: Secondary | ICD-10-CM | POA: Diagnosis not present

## 2023-04-14 DIAGNOSIS — D692 Other nonthrombocytopenic purpura: Secondary | ICD-10-CM | POA: Diagnosis not present

## 2023-04-14 DIAGNOSIS — R5383 Other fatigue: Secondary | ICD-10-CM | POA: Diagnosis not present

## 2023-04-14 DIAGNOSIS — I10 Essential (primary) hypertension: Secondary | ICD-10-CM | POA: Diagnosis not present

## 2023-04-14 DIAGNOSIS — I700 Atherosclerosis of aorta: Secondary | ICD-10-CM | POA: Diagnosis not present

## 2023-04-14 DIAGNOSIS — R739 Hyperglycemia, unspecified: Secondary | ICD-10-CM | POA: Diagnosis not present

## 2023-04-14 DIAGNOSIS — Z299 Encounter for prophylactic measures, unspecified: Secondary | ICD-10-CM | POA: Diagnosis not present

## 2023-04-15 LAB — BASIC METABOLIC PANEL WITH GFR: EGFR (Non-African Amer.): 66

## 2023-04-15 LAB — TSH: TSH: 1.29 (ref 0.41–5.90)

## 2023-04-16 LAB — HEMOGLOBIN A1C: A1c: 6.3

## 2023-04-21 ENCOUNTER — Ambulatory Visit: Payer: Medicare HMO | Admitting: Nurse Practitioner

## 2023-04-21 ENCOUNTER — Encounter: Payer: Self-pay | Admitting: Nurse Practitioner

## 2023-04-21 VITALS — BP 130/80 | HR 76 | Ht 61.0 in | Wt 202.1 lb

## 2023-04-21 DIAGNOSIS — K219 Gastro-esophageal reflux disease without esophagitis: Secondary | ICD-10-CM

## 2023-04-21 DIAGNOSIS — K3184 Gastroparesis: Secondary | ICD-10-CM

## 2023-04-21 DIAGNOSIS — D509 Iron deficiency anemia, unspecified: Secondary | ICD-10-CM

## 2023-04-21 DIAGNOSIS — Z8719 Personal history of other diseases of the digestive system: Secondary | ICD-10-CM

## 2023-04-21 DIAGNOSIS — R110 Nausea: Secondary | ICD-10-CM

## 2023-04-21 DIAGNOSIS — Z860101 Personal history of adenomatous and serrated colon polyps: Secondary | ICD-10-CM

## 2023-04-21 DIAGNOSIS — K2270 Barrett's esophagus without dysplasia: Secondary | ICD-10-CM

## 2023-04-21 MED ORDER — FAMOTIDINE 40 MG PO TABS: 40.0000 mg | ORAL_TABLET | Freq: Two times a day (BID) | ORAL | 3 refills | Status: DC

## 2023-04-21 MED ORDER — ONDANSETRON HCL 4 MG PO TABS: 4.0000 mg | ORAL_TABLET | Freq: Three times a day (TID) | ORAL | 2 refills | Status: DC | PRN

## 2023-04-21 NOTE — Progress Notes (Unsigned)
04/21/2023 [name] [id number] September 10, 1950   Chief Complaint:  History of Present Illness: [name]. Pretty   She denies having any nausea or vomiting during the holidays. 2 weeks ago she had nausea with vomiting  2 on one day, vomited up dinner.   She had labs done, SOB, felt run down.  Has PCP appt tomorrow.   Carafate Q HS  Famotidine twice daily  No heartburn. No upper or lower abdominal pain. No rectal pain. No rectal pain. No black stools. No blood per the rectum. Weight up.   Colonoscopy 12/29/2022 by Dr. Meridee Score: - Perianal skin tags found on perianal exam. - Palpable rectal lesion and hemorrhoids found on digital rectal exam. - The examined portion of the ileum was normal. - Stool in the entire examined colon - lavaged with adequate visualization. - Diverticulosis in the recto-sigmoid colon and in the sigmoid colon. - Two 3 to 5 mm polyps in the sigmoid colon and in the descending colon, removed with a cold snare. Resected and retrieved. - One 35 mm polyp in the distal rectum, removed with mucosal resection. Resected and retrieved. Treated with STSC to margin. Clips (MR conditional) were placed. - Non-bleeding non-thrombosed external and internal hemorrhoids. - recall colonoscopy in 1 year FINAL MICROSCOPIC DIAGNOSIS:  A. DESCENDING AND SIGMOID COLON, POLYPECTOMY:  Tubular adenoma, 2 fragments  Negative for high-grade dysplasia and carcinoma  B. RECTUM, POLYPECTOMY:  Tubular adenoma  Negative for high-grade dysplasia and carcinoma (see comment)  COMMENT:  Histologically there is a large polypoid fragment of adenoma with  multiple smaller fragments of adenoma.  Within these fragments are two  strips of unremarkable colonic mucosa which may represent the margins.  Clinical and colonoscopic correlation is recommended.   Flexible sigmoidoscopy 11/20/2022 by Dr. Rhea Belton: - Rectal mass palpable on rectal examination. - Rule out malignancy, tumor in the distal  rectum. Biopsied. - One 6 mm polyp in the sigmoid colon, removed with a cold snare. Resected and retrieved. - Diverticulosis in the sigmoid colon.  CTAP with contrast 11/27/2022: FINDINGS: Lower chest: Basilar scar or atelectasis identified bilaterally. No pleural effusion. Coronary artery calcifications are seen. Small hiatal hernia. Small cystic area identified along the margin of the pericardium on the right side anteriorly on series 3, image 17. This has seen in the study 2008 and could be a pericardial cyst. 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 Again this was seen on the study of 2008. Numerous surgical clips adjacent to the hiatal hernia and GE junction.   Hepatobiliary: Again elevated right hemidiaphragm. There are some tiny low-attenuation lesion seen in the liver such as series 3, image 28 which are too small to completely characterize although statistically likely benign cystic lesions. Patent portal vein. Previous cholecystectomy. Slight prominence of the common duct but normal tapering towards the pancreatic head in this could relate to the post cholecystectomy state and age.   Pancreas: Mild global pancreatic atrophy.  No obvious mass   Spleen: Normal in size without focal abnormality.   Adrenals/Urinary Tract: Stable adrenal glands. No enhancing renal mass. There is simple Bosniak 1 posterior left-sided renal cyst measuring 4.5 cm in diameter. Hounsfield unit of 12. No imaging follow-up. No collecting system dilatation. The ureters have normal course and caliber extending down to the bladder. Preserved contours of the urinary bladder.   Stomach/Bowel: The bowel is nondilated. Scattered stool. Left-sided colonic diverticula. Normal retrocecal appendix. Small bowel has a normal course and caliber. Once again  there is a hiatal hernia with surgical changes at the GE junction. Of the rectum just above the anorectal junction is some  slight asymmetric wall thickening towards the left side on series 3, image 91. Please correlate with the history. This has very nonspecific. Dedicated rectal examination could be performed as clinically appropriate   Vascular/Lymphatic: Scattered vascular calcifications. Normal caliber aorta and IVC. No discrete abnormal lymph node enlargement identified in the abdomen and pelvis.   Reproductive: Status post hysterectomy. No adnexal masses.   Other: No free air or free fluid.   Musculoskeletal: Curvature and degenerative changes along the spine. Scattered degenerative changes of the pelvis. There is a sclerotic focus involving the vertebral body at T5. This was seen on a CT scan of the thoracic spine from 12/14/2020   IMPRESSION: No bowel obstruction, free air or free fluid. Diffuse stool with colonic diverticulosis on the left side.   There is slight asymmetric wall thickening along the rectum on the left side. Please correlate for location of concern and further workup when clinically appropriate as this has has a differential.   Elevation of the right hemidiaphragm with surgical changes near the GE junction with a hiatal hernia. Please correlate clinical history. This a chronic process.   Pericardial cyst.      Past Medical History:  Diagnosis Date   Anemia    Barrett's esophagus 09/30/2018   EGD July 2020 - short segment, no dysplasia (repeat EGD July 2023)   Cerebrovascular disease    Depression with anxiety    Diabetes (HCC)    Diverticulosis    Dyslipidemia    Fundic gland polyps of stomach, benign    GERD (gastroesophageal reflux disease)    Hepatomegaly    Hiatal hernia    Hyperlipidemia    Hypertension    Hypothyroidism    Obesity    Obesity    Osteoarthritis    Pre-diabetes    Tracheal stenosis    Tremor    Urinary incontinence    Ventral hernia    Vitamin D deficiency    Past Surgical History:  Procedure Laterality Date   ABDOMINAL  HYSTERECTOMY     ABDOMINAL WALL MESH  REMOVAL     ABDOMINAL WALL MESH  REMOVAL     APPENDECTOMY     BREAST REDUCTION SURGERY     COLONOSCOPY     At a hospital. Can't remember   COLONOSCOPY WITH PROPOFOL N/A 12/29/2022   Procedure: COLONOSCOPY WITH PROPOFOL;  Surgeon: Lemar Lofty., MD;  Location: [facility];  Service: Gastroenterology;  Laterality: N/A;   ENDOSCOPIC MUCOSAL RESECTION N/A 12/29/2022   Procedure: ENDOSCOPIC MUCOSAL RESECTION;  Surgeon: Meridee Score Netty Starring., MD;  Location: [facility];  Service: Gastroenterology;  Laterality: N/A;   ESOPHAGOGASTRODUODENOSCOPY     serveral times. been years   GALLBLADDER SURGERY     HEMOSTASIS CLIP PLACEMENT  12/29/2022   Procedure: HEMOSTASIS CLIP PLACEMENT;  Surgeon: Lemar Lofty., MD;  Location: [facility];  Service: Gastroenterology;;   LAPAROSCOPIC GASTRIC BANDING     POLYPECTOMY  12/29/2022   Procedure: POLYPECTOMY;  Surgeon: Lemar Lofty., MD;  Location: [facility];  Service: Gastroenterology;;   REDUCTION MAMMAPLASTY     SUBMUCOSAL LIFTING INJECTION  12/29/2022   Procedure: SUBMUCOSAL LIFTING INJECTION;  Surgeon: Lemar Lofty., MD;  Location: [facility];  Service: Gastroenterology;;   TRACHEAL STENOSIS REPAIR W/ PERFUSION AND MLB         Current Medications, Allergies, Past Medical History, Past Surgical History,  Family History and Social History were reviewed in [employer] record.   Review of Systems:   Constitutional: Negative for fever, sweats, chills or weight loss.  Respiratory: Negative for shortness of breath.   Cardiovascular: Negative for chest pain, palpitations and leg swelling.  Gastrointestinal: See HPI.  Musculoskeletal: Negative for back pain or muscle aches.  Neurological: Negative for dizziness, headaches or paresthesias.    Physical Exam: There were no vitals taken for this visit. General: in no acute distress. Head:  Normocephalic and atraumatic. Eyes: No scleral icterus. Conjunctiva pink . Ears: Normal auditory acuity. Mouth: Dentition intact. No ulcers or lesions.  Lungs: Clear throughout to auscultation. Heart: Regular rate and rhythm, no murmur. Abdomen: Soft, nontender and nondistended. No masses or hepatomegaly. Normal bowel sounds x 4 quadrants.  Rectal: Deferred.  Musculoskeletal: Symmetrical with no gross deformities. Extremities: No edema. Neurological: Alert oriented x 4. No focal deficits.  Psychological: Alert and cooperative. Normal mood and affect  Assessment and Recommendations: ***

## 2023-04-21 NOTE — Patient Instructions (Addendum)
Recall colonoscopy due 12/2023.  We have sent the following medications to your pharmacy for you to pick up at your convenience: Famotidine & Zofran  Due to recent changes in healthcare laws, you may see the results of your imaging and laboratory studies on MyChart before your provider has had a chance to review them.  We understand that in some cases there may be results that are confusing or concerning to you. Not all laboratory results come back in the same time frame and the provider may be waiting for multiple results in order to interpret others.  Please give [place] 48 hours in order for your provider to thoroughly review all the results before contacting the office for clarification of your results.   Thank you for trusting me with your gastrointestinal care!   Alcide Evener, CRNP

## 2023-04-22 DIAGNOSIS — Z299 Encounter for prophylactic measures, unspecified: Secondary | ICD-10-CM | POA: Diagnosis not present

## 2023-04-22 DIAGNOSIS — I700 Atherosclerosis of aorta: Secondary | ICD-10-CM | POA: Diagnosis not present

## 2023-04-22 DIAGNOSIS — E876 Hypokalemia: Secondary | ICD-10-CM | POA: Diagnosis not present

## 2023-04-22 DIAGNOSIS — I10 Essential (primary) hypertension: Secondary | ICD-10-CM | POA: Diagnosis not present

## 2023-04-22 DIAGNOSIS — D582 Other hemoglobinopathies: Secondary | ICD-10-CM | POA: Diagnosis not present

## 2023-04-22 NOTE — Progress Notes (Signed)
Addendum: Reviewed and agree with assessment and management plan. [name], I will plan to perform surveillance colonoscopy in the [facility] if okay with patient [name], [name], MD

## 2023-04-23 NOTE — Progress Notes (Signed)
DD, pls contact patient and let her know that Dr. Rhea Belton will be the physician to do her follow up colonoscopy.

## 2023-04-30 NOTE — Progress Notes (Signed)
 Contacted patient and patient verbalized understanding that Dr.Pyrtle will be doing her follow up colonoscopy.

## 2023-06-03 NOTE — Progress Notes (Unsigned)
 PATIENT: [name] DOB: Apr 23, 1950  REASON FOR VISIT: Follow up for essential tremor HISTORY FROM: Patient PRIMARY NEUROLOGIST: Dr. Vickey Huger  ASSESSMENT AND PLAN 73 y.o. year old female   1.  Essential tremor  -Over time, slight worsening of essential tremor, mostly in her hands -Continue Xanax 0.5 mg as needed for tremor, uses sparingly, last refill March 2024 # 60 tablets -Has tried beta-blocker, primidone, Topamax, gabapentin in the past -Has been evaluated for DBS, found to not be a candidate by Dr. Arbutus Leas in 2016 -Insurance denied [facility] -Follow-up in 1 year or sooner if needed  Meds ordered this encounter  Medications   ALPRAZolam (XANAX) 0.5 MG tablet    Sig: Take 1 tablet (0.5 mg total) by mouth 2 (two) times daily as needed for anxiety.    Dispense:  60 tablet    Refill:  0   HISTORY OF PRESENT ILLNESS: Today 06/04/23 ET has increased in both hands, head. More difficult to use keyboard. 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 When she carries things sound like she playing jingle bells. Insurance denied [employer]. Continues to active, however considering slowing her business down. Health has been good. Getting around okay, no falls, no assistive devices. She takes Xanax 0.5 mg PRN for worsening tremor, she gets # 60 tablets once a year. She breaks them in half. Helps the tremor. Overall tremor worsening a little overtime. Mentions colonoscopy last year, had rectal mass that was non-cancerous.   06/04/22 Dr. Vickey Huger: [name] is a 73 year old right-handed white female with a history of essential tremor.  The patient indicates that she was about 73 years old when she first started noticing a tremor.  The tremor has gradually worsened as she has gotten older.  Her brother now has a tremor, and there are several first cousins on her father side of the family with tremor.  The tremor has affected head and neck and both arms, she has noted difficulty with  handwriting and with using utensils such as knives.  She does not cook for fear of spilling something and burning herself.  The patient has been on gabapentin and primidone in the past, she has been evaluated for possible deep brain stimulator but was not felt to be an adequate candidate secondary to depression.  The patient has done fairly well with low-dose alprazolam, she will take 0.5 mg intermittently for the tremor when she needs it.  She does have good days and bad days with tremor.  The patient reports some problems with gastroparesis, she has some fecal and urinary incontinence issues and a history of a right hemidiaphragm problem.  She denies any falls or any significant gait instability.  She has no weakness of the extremities but she does note some numbness in the toes at times..   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 Gastroparesis. Chronic nausea. On zofran, on cymbalta , on xanax.    Status post failed gastric band surgery in 2007 . Had sepsis within a week of surgery, secondary abdominal wound healing, mash  repair in [place] at [facility] .     Family : 2 brothers out of 3 with essential tremor. Father died at age 56, PGF had tremors.    The patient is a 73 y.o. right handed female with a history of tremor.  Pt is accompanied by her  husband and her best friend who supplements the history.  States that tremor started in 2007 after lap band surgery.  States that she had complications associated with this, including sepsis.  She states that she was told in [place] that she had "coded" and had anoxia and that there may have been a stroke but states that there has been some confusion about this diagnosis.  There was no tremor present before this incident.  She was in the hospital for 3 months and she noted the onset of tremor in the hospital.  She started  treatment in 2008.  She was started on primidone and didn't notice a big difference.  There is a ? family hx of tremor in a brother of hers, but nothing like hers.  Pt is currently on primidone 250 mg daily and xanax 1 mg tid.  She has tried topamax in the past for tremor and she cannot remember her experience with that but thinks that it didn't help.  She has not tried beta blocker therapy as it was felt that her depression was too significant for this therapy.  Pt admits to long term depression "all my life" but "I haven't been able to shake the depression" recently.  She has been on prozac for a long time and wellbutrin was added without success.  She has never seen psychiatry but does have an appt with [facility] soon.  States that she does have total lack of focus and memory loss since her lap band surgery and cannot even boil water or will forget it on the stove.     06/05/21 SS: [name] here today for follow-up. Has tremor in both hands, head, worsened overtime. Isn't taking the Xanax at all, has it for 0.5 mg twice daily if needed, it makes her sleepy. Doesn't cook any longer because of tremor. Back in 90's had her eye make tattooed on, couldn't do her make up now.  No falls. Has 2 brother with essential tremor, her parents passed away early.  In the past has tried and failed beta-blocker, primidone, gabapentin, Topamax.  Was evaluated in 2016 by Dr. Arbutus Leas for DBS, was felt to not be a candidate given significant depression, also the fact that the tremor started after surgery for lap band that had multiple complications including sepsis, could have been a hypoxic event involved.  HISTORY  08/16/2020 Dr. Anne Hahn: [name] is a 73 year old right-handed white female with a history of essential tremor.  The patient indicates that she was about 73 years old when she first started noticing a tremor.  The tremor has gradually worsened as she has gotten older.  Her brother now has a tremor, and there are  several first cousins on her father side of the family with tremor.  The tremor has affected head and neck and both arms, she has noted difficulty with handwriting and with using utensils such as knives.  She does not cook for fear of spilling something and burning herself.  The patient has been on gabapentin and primidone in the past, she has been evaluated for possible deep brain stimulator but was not felt to be an adequate candidate secondary to depression.  The patient has done fairly well with low-dose alprazolam, she will take 0.5 mg intermittently for the tremor when she needs it.  She does have good days and bad days with tremor.  The patient reports some problems with gastroparesis, she has some fecal and urinary incontinence issues and a history of a  right hemidiaphragm problem.  She denies any falls or any significant gait instability.  She has no weakness of the extremities but she does note some numbness in the toes at times.  She returns to this office for further evaluation.  REVIEW OF SYSTEMS: Out of a complete 14 system review of symptoms, the patient complains only of the following symptoms, and all other reviewed systems are negative.  See HPI  ALLERGIES: Allergies  Allergen Reactions   Cefazolin Anaphylaxis   Ace Inhibitors Cough   Enalapril Cough   Quinapril    Trazodone And Nefazodone    Ivp Dye [Iodinated Contrast Media]     Pain in legs after having CT scan   Penicillin G Rash   Penicillins     HOME MEDICATIONS: Outpatient Medications Prior to Visit  Medication Sig Dispense Refill   ALPRAZolam (XANAX) 0.5 MG tablet Take 1 tablet (0.5 mg total) by mouth 2 (two) times daily as needed for anxiety. 60 tablet 0   aspirin 81 MG tablet Take 81 mg by mouth daily.     Biotin 5000 MCG CAPS Take 1 capsule by mouth daily.     bumetanide (BUMEX) 1 MG tablet 1 tablet daily.     diphenhydrAMINE (BENADRYL) 50 MG tablet Take one tablet by mouth as directed per CT protocol 1 tablet  0   diphenhydrAMINE HCl (ALLERGY MED PO) Take 1 tablet by mouth daily as needed.     DULoxetine (CYMBALTA) 60 MG capsule 1 capsule daily.     famotidine (PEPCID) 40 MG tablet Take 1 tablet (40 mg total) by mouth 2 (two) times daily. 180 tablet 3   levothyroxine (SYNTHROID, LEVOTHROID) 50 MCG tablet TAKE 1 TABLET DAILY BEFORE BREAKFAST. 90 tablet 1   modafinil (PROVIGIL) 200 MG tablet 1 tablet daily.     ondansetron (ZOFRAN) 4 MG tablet Take 1 tablet (4 mg total) by mouth every 8 (eight) hours as needed for nausea or vomiting. 30 tablet 1   ondansetron (ZOFRAN) 4 MG tablet Take 1 tablet (4 mg total) by mouth every 8 (eight) hours as needed for nausea or vomiting. 30 tablet 2   PAPAYA ENZYME PO Take by mouth. After meals     pravastatin (PRAVACHOL) 40 MG tablet TAKE 1 TABLET AT BEDTIME FOR CHOLESTEROL (Patient taking differently: Take 40 mg by mouth daily.) 90 tablet 1   PROBIOTIC CAPS Take 2 capsules by mouth daily.      sucralfate (CARAFATE) 1 g tablet TAKE 1 TABLET AT BEDTIME. DO NOT TAKE WITHIN 2 HOURS OF OTHER MEDICATIONS 90 tablet 2   Ferrous Sulfate (IRON) 325 (65 Fe) MG TABS 325 mg daily.     Methylsulfonylmethane (MSM) 1500 MG TABS Take 1 tablet by mouth 2 (two) times daily.     Vitamin D, Cholecalciferol, 50 MCG (2000 UT) CAPS Take 1 capsule by mouth daily.     No facility-administered medications prior to visit.    PAST MEDICAL HISTORY: Past Medical History:  Diagnosis Date   Anemia    Barrett's esophagus 09/30/2018   EGD July 2020 - short segment, no dysplasia (repeat EGD July 2023)   Cerebrovascular disease    Depression with anxiety    Diabetes (HCC)    Diverticulosis    Dyslipidemia    Fundic gland polyps of stomach, benign    GERD (gastroesophageal reflux disease)    Hepatomegaly    Hiatal hernia    Hyperlipidemia    Hypertension    Hypothyroidism    Obesity  Obesity    Osteoarthritis    Pre-diabetes    Tracheal stenosis    Tremor    Urinary incontinence     Ventral hernia    Vitamin D deficiency     PAST SURGICAL HISTORY: Past Surgical History:  Procedure Laterality Date   ABDOMINAL HYSTERECTOMY     ABDOMINAL WALL MESH  REMOVAL     ABDOMINAL WALL MESH  REMOVAL     APPENDECTOMY     BREAST REDUCTION SURGERY     COLONOSCOPY     At a hospital. Can't remember   COLONOSCOPY WITH PROPOFOL N/A 12/29/2022   Procedure: COLONOSCOPY WITH PROPOFOL;  Surgeon: Lemar Lofty., MD;  Location: [facility];  Service: Gastroenterology;  Laterality: N/A;   ENDOSCOPIC MUCOSAL RESECTION N/A 12/29/2022   Procedure: ENDOSCOPIC MUCOSAL RESECTION;  Surgeon: Meridee Score Netty Starring., MD;  Location: [facility];  Service: Gastroenterology;  Laterality: N/A;   ESOPHAGOGASTRODUODENOSCOPY     serveral times. been years   GALLBLADDER SURGERY     HEMOSTASIS CLIP PLACEMENT  12/29/2022   Procedure: HEMOSTASIS CLIP PLACEMENT;  Surgeon: Lemar Lofty., MD;  Location: [facility];  Service: Gastroenterology;;   LAPAROSCOPIC GASTRIC BANDING     POLYPECTOMY  12/29/2022   Procedure: POLYPECTOMY;  Surgeon: Lemar Lofty., MD;  Location: [facility];  Service: Gastroenterology;;   REDUCTION MAMMAPLASTY     SUBMUCOSAL LIFTING INJECTION  12/29/2022   Procedure: SUBMUCOSAL LIFTING INJECTION;  Surgeon: Lemar Lofty., MD;  Location: [facility];  Service: Gastroenterology;;   TRACHEAL STENOSIS REPAIR W/ PERFUSION AND MLB      FAMILY HISTORY: Family History  Problem Relation Age of Onset   Heart disease Mother    Heart disease Father    Melanoma Brother    Diabetes Maternal Aunt    Stroke Son    Colon cancer Neg Hx    Esophageal cancer Neg Hx    Breast cancer Neg Hx    Rectal cancer Neg Hx    Stomach cancer Neg Hx    Inflammatory bowel disease Neg Hx    Liver disease Neg Hx    Pancreatic cancer Neg Hx     SOCIAL HISTORY: Social History   Socioeconomic History   Marital status: Married    Spouse name: Maisie Fus   Number of  children: 1   Years of education: 1-College   Highest education level: Not on file  Occupational History   Occupation: retired  Tobacco Use   Smoking status: Never   Smokeless tobacco: Never  Vaping Use   Vaping status: Never Used  Substance and Sexual Activity   Alcohol use: Yes    Alcohol/week: 0.0 standard drinks of alcohol    Comment: rarely (1 every 2 months)   Drug use: No   Sexual activity: Never  Other Topics Concern   Not on file  Social History Narrative   Lives at home with husband   Patient is right handed.   Patient drinks caffeine occasionally.   Social Drivers of Corporate investment banker Strain: Not on file  Food Insecurity: No Food Insecurity (02/09/2023)   Hunger Vital Sign    Worried About Running Out of Food in the Last Year: Never true    Ran Out of Food in the Last Year: Never true  Transportation Needs: No Transportation Needs (02/09/2023)   PRAPARE - Administrator, Civil Service (Medical): No    Lack of Transportation (Non-Medical): No  Physical Activity: Not on file  Stress:  Not on file  Social Connections: Not on file  Intimate Partner Violence: Not At Risk (02/09/2023)   Humiliation, Afraid, Rape, and Kick questionnaire    Fear of Current or Ex-Partner: No    Emotionally Abused: No    Physically Abused: No    Sexually Abused: No   PHYSICAL EXAM  Vitals:   06/04/23 1320  BP: (!) 164/99  Pulse: 73  Weight: 202 lb 2.6 oz (91.7 kg)  Height: 5\' 1"  (1.549 m)   Body mass index is 38.2 kg/m.  Generalized: Well developed, in no acute distress  Neurological examination  Mentation: Alert oriented to time, place, history taking. Follows all commands speech and language fluent Cranial nerve II-XII: Pupils were equal round reactive to light. Extraocular movements were full, visual field were full on confrontational test. Facial sensation and strength were normal.  Head turning and shoulder shrug  were normal and symmetric.  Head  tremor is noted mild Motor: The motor testing reveals 5 over 5 strength of all 4 extremities. Good symmetric motor tone is noted throughout.  Sensory: Sensory testing is intact to soft touch on all 4 extremities. No evidence of extinction is noted.  Coordination: Cerebellar testing reveals good finger-nose-finger and heel-to-shin bilaterally.  Moderate tremor with finger-nose-finger bilaterally.  There is moderate tremor translated to handwriting and spiral drawl sample Gait and station: Gait is slightly wide-based, tremor bilateral hands with gait Reflexes: Deep tendon reflexes are symmetric and normal bilaterally.   DIAGNOSTIC DATA (LABS, IMAGING, TESTING) - I reviewed patient records, labs, notes, testing and imaging myself where available.  Lab Results  Component Value Date   WBC 6.4 12/17/2022   HGB 14.6 12/17/2022   HCT 45.2 12/17/2022   MCV 90.5 12/17/2022   PLT 192.0 12/17/2022      Component Value Date/Time   NA 140 01/15/2021 1210   K 4.0 01/15/2021 1210   CL 101 01/15/2021 1210   CO2 31 01/15/2021 1210   GLUCOSE 118 (H) 01/15/2021 1210   BUN 18 11/24/2022 1504   CREATININE 1.00 11/24/2022 1504   CREATININE 0.88 01/16/2015 1008   CALCIUM 9.7 01/15/2021 1210   PROT 7.1 01/15/2021 1210   ALBUMIN 4.3 01/15/2021 1210   AST 13 01/15/2021 1210   ALT 7 01/15/2021 1210   ALKPHOS 103 01/15/2021 1210   BILITOT 0.6 01/15/2021 1210   GFRNONAA 69 01/16/2015 1008   GFRAA 80 01/16/2015 1008   Lab Results  Component Value Date   CHOL 163 01/16/2015   HDL 73 01/16/2015   LDLCALC 72 01/16/2015   TRIG 91 01/16/2015   CHOLHDL 2.2 01/16/2015   Lab Results  Component Value Date   HGBA1C 5.5 01/16/2015   Lab Results  Component Value Date   VITAMINB12 574 08/29/2013   Lab Results  Component Value Date   TSH 1.002 01/16/2015   Otila Kluver, DNP  [facility] [address] [place], [place] [address] [phone]

## 2023-06-04 ENCOUNTER — Encounter: Payer: Self-pay | Admitting: Neurology

## 2023-06-04 ENCOUNTER — Ambulatory Visit: Payer: Medicare HMO | Admitting: Neurology

## 2023-06-04 DIAGNOSIS — R490 Dysphonia: Secondary | ICD-10-CM

## 2023-06-04 DIAGNOSIS — G250 Essential tremor: Secondary | ICD-10-CM

## 2023-06-04 DIAGNOSIS — R251 Tremor, unspecified: Secondary | ICD-10-CM | POA: Diagnosis not present

## 2023-06-04 MED ORDER — ALPRAZOLAM 0.5 MG PO TABS: 0.5000 mg | ORAL_TABLET | Freq: Two times a day (BID) | ORAL | 0 refills | Status: DC | PRN

## 2023-06-04 NOTE — Patient Instructions (Signed)
 Great to see you today.  I will refill your Xanax to use sparingly for tremor.  Follow-up in 1 year or sooner if needed.  Thanks!!

## 2023-06-24 ENCOUNTER — Other Ambulatory Visit: Payer: Self-pay | Admitting: Nurse Practitioner

## 2023-06-24 DIAGNOSIS — I10 Essential (primary) hypertension: Secondary | ICD-10-CM | POA: Diagnosis not present

## 2023-06-24 DIAGNOSIS — E7800 Pure hypercholesterolemia, unspecified: Secondary | ICD-10-CM | POA: Diagnosis not present

## 2023-06-24 DIAGNOSIS — Z299 Encounter for prophylactic measures, unspecified: Secondary | ICD-10-CM | POA: Diagnosis not present

## 2023-06-24 DIAGNOSIS — Z0000 Encounter for general adult medical examination without abnormal findings: Secondary | ICD-10-CM | POA: Diagnosis not present

## 2023-06-24 DIAGNOSIS — Z1339 Encounter for screening examination for other mental health and behavioral disorders: Secondary | ICD-10-CM | POA: Diagnosis not present

## 2023-06-24 DIAGNOSIS — E039 Hypothyroidism, unspecified: Secondary | ICD-10-CM | POA: Diagnosis not present

## 2023-06-24 DIAGNOSIS — Z7189 Other specified counseling: Secondary | ICD-10-CM | POA: Diagnosis not present

## 2023-06-24 DIAGNOSIS — M810 Age-related osteoporosis without current pathological fracture: Secondary | ICD-10-CM | POA: Diagnosis not present

## 2023-06-24 DIAGNOSIS — Z1331 Encounter for screening for depression: Secondary | ICD-10-CM | POA: Diagnosis not present

## 2023-06-24 DIAGNOSIS — Z79899 Other long term (current) drug therapy: Secondary | ICD-10-CM | POA: Diagnosis not present

## 2023-06-24 DIAGNOSIS — R5383 Other fatigue: Secondary | ICD-10-CM | POA: Diagnosis not present

## 2023-09-15 DIAGNOSIS — M25519 Pain in unspecified shoulder: Secondary | ICD-10-CM | POA: Diagnosis not present

## 2023-09-15 DIAGNOSIS — I10 Essential (primary) hypertension: Secondary | ICD-10-CM | POA: Diagnosis not present

## 2023-09-15 DIAGNOSIS — M810 Age-related osteoporosis without current pathological fracture: Secondary | ICD-10-CM | POA: Diagnosis not present

## 2023-09-15 DIAGNOSIS — Z299 Encounter for prophylactic measures, unspecified: Secondary | ICD-10-CM | POA: Diagnosis not present

## 2023-09-15 DIAGNOSIS — E039 Hypothyroidism, unspecified: Secondary | ICD-10-CM | POA: Diagnosis not present

## 2023-09-15 DIAGNOSIS — R7303 Prediabetes: Secondary | ICD-10-CM | POA: Diagnosis not present

## 2023-09-21 DIAGNOSIS — I10 Essential (primary) hypertension: Secondary | ICD-10-CM | POA: Diagnosis not present

## 2023-10-06 DIAGNOSIS — I10 Essential (primary) hypertension: Secondary | ICD-10-CM | POA: Diagnosis not present

## 2023-10-06 DIAGNOSIS — M810 Age-related osteoporosis without current pathological fracture: Secondary | ICD-10-CM | POA: Diagnosis not present

## 2023-10-06 DIAGNOSIS — Z6835 Body mass index (BMI) 35.0-35.9, adult: Secondary | ICD-10-CM | POA: Diagnosis not present

## 2023-10-06 DIAGNOSIS — R52 Pain, unspecified: Secondary | ICD-10-CM | POA: Diagnosis not present

## 2023-10-06 DIAGNOSIS — Z299 Encounter for prophylactic measures, unspecified: Secondary | ICD-10-CM | POA: Diagnosis not present

## 2023-10-07 DIAGNOSIS — M542 Cervicalgia: Secondary | ICD-10-CM | POA: Diagnosis not present

## 2023-10-07 DIAGNOSIS — M25512 Pain in left shoulder: Secondary | ICD-10-CM | POA: Diagnosis not present

## 2023-10-08 DIAGNOSIS — I10 Essential (primary) hypertension: Secondary | ICD-10-CM | POA: Diagnosis not present

## 2023-10-27 DIAGNOSIS — Z299 Encounter for prophylactic measures, unspecified: Secondary | ICD-10-CM | POA: Diagnosis not present

## 2023-10-27 DIAGNOSIS — R7303 Prediabetes: Secondary | ICD-10-CM | POA: Diagnosis not present

## 2023-10-27 DIAGNOSIS — I10 Essential (primary) hypertension: Secondary | ICD-10-CM | POA: Diagnosis not present

## 2023-11-02 DIAGNOSIS — M25512 Pain in left shoulder: Secondary | ICD-10-CM | POA: Diagnosis not present

## 2023-11-07 DIAGNOSIS — I10 Essential (primary) hypertension: Secondary | ICD-10-CM | POA: Diagnosis not present

## 2023-11-10 ENCOUNTER — Other Ambulatory Visit: Payer: Self-pay | Admitting: Nurse Practitioner

## 2023-11-18 ENCOUNTER — Other Ambulatory Visit: Payer: Self-pay | Admitting: Nurse Practitioner

## 2023-11-25 ENCOUNTER — Other Ambulatory Visit: Payer: Self-pay | Admitting: Neurology

## 2023-11-25 DIAGNOSIS — G250 Essential tremor: Secondary | ICD-10-CM

## 2023-11-25 DIAGNOSIS — R251 Tremor, unspecified: Secondary | ICD-10-CM

## 2023-11-30 NOTE — Telephone Encounter (Signed)
 Requested Prescriptions   Pending Prescriptions Disp Refills   ALPRAZolam  (XANAX ) 0.5 MG tablet [Pharmacy Med Name: ALPRAZOLAM  0.5 MG Oral Tablet] 60 tablet     Sig: TAKE 1 TABLET TWICE DAILY AS NEEDED FOR ANXIETY   Last seen 06/04/23 Next appt 06/07/24  Dispenses   Dispensed Days Supply Quantity Provider Pharmacy  alprazolam  0.5 mg tablet 06/04/2023 30 60 tablet Gayland Lauraine PARAS, NP [facility] [email or web address]

## 2023-12-08 DIAGNOSIS — I10 Essential (primary) hypertension: Secondary | ICD-10-CM | POA: Diagnosis not present

## 2023-12-16 ENCOUNTER — Other Ambulatory Visit: Payer: Self-pay | Admitting: Internal Medicine

## 2023-12-19 DIAGNOSIS — M7502 Adhesive capsulitis of left shoulder: Secondary | ICD-10-CM | POA: Diagnosis not present

## 2024-01-04 ENCOUNTER — Other Ambulatory Visit: Payer: Self-pay | Admitting: Nurse Practitioner

## 2024-01-20 DIAGNOSIS — Z1283 Encounter for screening for malignant neoplasm of skin: Secondary | ICD-10-CM | POA: Diagnosis not present

## 2024-01-20 DIAGNOSIS — D225 Melanocytic nevi of trunk: Secondary | ICD-10-CM | POA: Diagnosis not present

## 2024-01-31 ENCOUNTER — Other Ambulatory Visit: Payer: Self-pay | Admitting: Nurse Practitioner

## 2024-02-02 DIAGNOSIS — E039 Hypothyroidism, unspecified: Secondary | ICD-10-CM | POA: Diagnosis not present

## 2024-02-02 DIAGNOSIS — I10 Essential (primary) hypertension: Secondary | ICD-10-CM | POA: Diagnosis not present

## 2024-02-02 DIAGNOSIS — F339 Major depressive disorder, recurrent, unspecified: Secondary | ICD-10-CM | POA: Diagnosis not present

## 2024-02-02 DIAGNOSIS — Z299 Encounter for prophylactic measures, unspecified: Secondary | ICD-10-CM | POA: Diagnosis not present

## 2024-02-02 DIAGNOSIS — Z0000 Encounter for general adult medical examination without abnormal findings: Secondary | ICD-10-CM | POA: Diagnosis not present

## 2024-02-02 DIAGNOSIS — R52 Pain, unspecified: Secondary | ICD-10-CM | POA: Diagnosis not present

## 2024-02-11 ENCOUNTER — Other Ambulatory Visit: Payer: Self-pay | Admitting: Nurse Practitioner

## 2024-03-22 ENCOUNTER — Telehealth: Payer: Self-pay | Admitting: Nurse Practitioner

## 2024-03-22 NOTE — Telephone Encounter (Signed)
 Pts last colon was done in the hospital with Dr. Wilhelmenia. She was to have repeat colon done in 1 year and wants to know if she can have it in the [facility] this time. Please advise.

## 2024-03-22 NOTE — Telephone Encounter (Signed)
 Inbound call from patient requesting to schedule a colonoscopy. Patient is wanting the doctor to clarify weather she is needing to have her colonoscopy here at our office or at the hospital. Patient is requesting a call back. .Please advise.

## 2024-03-22 NOTE — Telephone Encounter (Signed)
 Spoke with pt and she is aware of recommendations and would like to schedule the appts. Pt scheduled for flex sig in the [facility] 1/201 at 9am. Telephone previsit scheduled for 03/25/24 at 10:30am. Pt aware of appts.

## 2024-03-22 NOTE — Telephone Encounter (Signed)
 Flex sigmoidoscopy should suffice and [facility] is okay for this exam JMP

## 2024-03-25 ENCOUNTER — Encounter: Payer: Self-pay | Admitting: Internal Medicine

## 2024-03-25 ENCOUNTER — Ambulatory Visit

## 2024-03-25 VITALS — Ht 61.0 in | Wt 195.0 lb

## 2024-03-25 DIAGNOSIS — Z860100 Personal history of colon polyps, unspecified: Secondary | ICD-10-CM

## 2024-03-25 NOTE — Progress Notes (Signed)
 No egg or soy allergy known to patient  No issues known to pt with past sedation with any surgeries or procedures Patient denies ever being told they had issues or difficulty with intubation  No FH of Malignant Hyperthermia Pt is not on diet pills Pt is not on  home 02  Pt is not on blood thinners  Pt has intermittent issues with constipation, takes Miralax and stool softeners as needed No A fib or A flutter Have any cardiac testing pending--No Pt can ambulate  Pt denies use of chewing tobacco Discussed diabetic I weight loss medication holds Discussed NSAID holds Checked BMI Pt instructed to use Singlecare.com or GoodRx for a price reduction on prep  Patient's chart reviewed Pre visit completed

## 2024-03-29 ENCOUNTER — Ambulatory Visit: Admitting: Internal Medicine

## 2024-03-29 ENCOUNTER — Encounter: Payer: Self-pay | Admitting: Internal Medicine

## 2024-03-29 VITALS — BP 112/73 | HR 75 | Temp 97.3°F | Resp 11 | Ht 61.0 in | Wt 195.0 lb

## 2024-03-29 DIAGNOSIS — Z860100 Personal history of colon polyps, unspecified: Secondary | ICD-10-CM

## 2024-03-29 DIAGNOSIS — K5730 Diverticulosis of large intestine without perforation or abscess without bleeding: Secondary | ICD-10-CM

## 2024-03-29 DIAGNOSIS — K621 Rectal polyp: Secondary | ICD-10-CM | POA: Diagnosis not present

## 2024-03-29 DIAGNOSIS — Z9889 Other specified postprocedural states: Secondary | ICD-10-CM

## 2024-03-29 DIAGNOSIS — D128 Benign neoplasm of rectum: Secondary | ICD-10-CM

## 2024-03-29 MED ORDER — SODIUM CHLORIDE 0.9 % IV SOLN: 500.0000 mL | Freq: Once | INTRAVENOUS | Status: DC

## 2024-03-29 NOTE — Op Note (Signed)
 [facility] Patient Name: [name] Procedure Date: 03/29/2024 9:59 AM MRN: [id number] Endoscopist: Gordy CHRISTELLA Starch , MD, [id number] Age: 74 Referring MD:  Date of Birth: February 15, 1951 Gender: Female Account #: [id number] Procedure:                Flexible Sigmoidoscopy Indications:              Personal history of colonic polyps; History of 35                            mm rectal adenoma removed by EMR October 2024 Medicines:                Monitored Anesthesia Care Procedure:                Pre-Anesthesia Assessment:                           - Prior to the procedure, a History and Physical                            was performed, and patient medications and                            allergies were reviewed. The patient's tolerance of                            previous anesthesia was also reviewed. The risks                            and benefits of the procedure and the sedation                            options and risks were discussed with the patient.                            All questions were answered, and informed consent                            was obtained. Prior Anticoagulants: The patient has                            taken no anticoagulant or antiplatelet agents. ASA                            Grade Assessment: II - A patient with mild systemic                            disease. After reviewing the risks and benefits,                            the patient was deemed in satisfactory condition to                            undergo the procedure.  After obtaining informed consent, the scope was                            passed under direct vision. The Olympus Scope SN:                            [id number] was introduced through the anus and                            advanced to the sigmoid colon. The flexible                            sigmoidoscopy was accomplished without difficulty.                            The patient  tolerated the procedure well. The                            quality of the bowel preparation was good. Scope In: 10:15:24 AM Scope Out: 10:24:40 AM Total Procedure Duration: 0 hours 9 minutes 16 seconds  Findings:                 The digital rectal exam was normal.                           Multiple medium-mouthed and small-mouthed                            diverticula were found in the sigmoid colon.                           A 3 mm polyp was found in the mid rectum. The polyp                            was sessile. The polyp was removed with a cold                            snare. Resection and retrieval were complete.                           A 7 mm post polypectomy scar was found in the                            distal rectum. There was no evidence of the                            previous polyp.                           No additional abnormalities were found on                            retroflexion. Complications:            No immediate complications. Estimated Blood  Loss:     Estimated blood loss: none. Impression:               - Moderate diverticulosis in the sigmoid colon.                           - One 3 mm polyp in the mid rectum, removed with a                            cold snare. Resected and retrieved. This polyp was                            more proximal to the previously resected 35 mm                            rectal polyp.                           - Post-polypectomy scar in the distal rectum. No                            evidence of residual polyp. Recommendation:           - Patient has a contact number available for                            emergencies. The signs and symptoms of potential                            delayed complications were discussed with the                            patient. Return to normal activities tomorrow.                            Written discharge instructions were provided to the                            patient.                            - Resume previous diet.                           - Await pathology results.                           - Repeat colonoscopy recommended Oct 2027. Gordy CHRISTELLA Starch, MD 03/29/2024 10:30:55 AM This report has been signed electronically.

## 2024-03-29 NOTE — Progress Notes (Signed)
 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  The nature of the procedure, as well as the risks, benefits, and alternatives were carefully and thoroughly reviewed with the patient. Ample time for discussion and questions allowed.  All questions were answered. The patient understood, was satisfied, and agreed with the plan to proceed.    HPI: [name] is a 74 y.o. female who presents for flexible sigmoidoscopy.  Medical history as below.  Tolerated the prep.  No recent chest pain or shortness of breath.  No abdominal pain today.  Past Medical History:  Diagnosis Date   Anemia    Barrett's esophagus 09/30/2018   EGD July 2020 - short segment, no dysplasia (repeat EGD July 2023)   Cerebrovascular disease    Depression with anxiety    Diabetes (HCC)    Diverticulosis    Dyslipidemia    Fundic gland polyps of stomach, benign    GERD (gastroesophageal reflux disease)    Hepatomegaly    Hiatal hernia    Hyperlipidemia    Hypertension    Hypothyroidism    Obesity    Obesity    Osteoarthritis    Pre-diabetes    Tracheal stenosis    Tremor    Urinary incontinence    Ventral hernia    Vitamin D  deficiency     Past Surgical History:  Procedure Laterality Date   ABDOMINAL HYSTERECTOMY     ABDOMINAL WALL MESH  REMOVAL     ABDOMINAL WALL MESH  REMOVAL     APPENDECTOMY     BREAST REDUCTION SURGERY     COLONOSCOPY     At a hospital. Can't remember   COLONOSCOPY WITH PROPOFOL  N/A 12/29/2022   Procedure: COLONOSCOPY WITH PROPOFOL ;  Surgeon: Wilhelmenia Aloha Raddle., MD;  Location: [facility];  Service: Gastroenterology;  Laterality: N/A;   ENDOSCOPIC MUCOSAL RESECTION N/A 12/29/2022   Procedure: ENDOSCOPIC MUCOSAL  RESECTION;  Surgeon: Wilhelmenia Aloha Raddle., MD;  Location: [facility];  Service: Gastroenterology;  Laterality: N/A;   ESOPHAGOGASTRODUODENOSCOPY     serveral times. been years   GALLBLADDER SURGERY     HEMOSTASIS CLIP PLACEMENT  12/29/2022   Procedure: HEMOSTASIS CLIP PLACEMENT;  Surgeon: Wilhelmenia Aloha Raddle., MD;  Location: [facility];  Service: Gastroenterology;;   LAPAROSCOPIC GASTRIC BANDING     POLYPECTOMY  12/29/2022   Procedure: POLYPECTOMY;  Surgeon: Wilhelmenia Aloha Raddle., MD;  Location: [facility];  Service: Gastroenterology;;   REDUCTION MAMMAPLASTY     SUBMUCOSAL LIFTING INJECTION  12/29/2022   Procedure: SUBMUCOSAL LIFTING INJECTION;  Surgeon: Wilhelmenia Aloha Raddle., MD;  Location: [facility];  Service: Gastroenterology;;   TRACHEAL STENOSIS REPAIR W/ PERFUSION AND MLB      Prior to Admission medications  Medication Sig Start Date End Date Taking? Authorizing Provider  ALPRAZolam  (XANAX ) 0.5 MG tablet TAKE 1 TABLET TWICE DAILY AS NEEDED FOR ANXIETY 11/30/23  Yes Dohmeier, Dedra, MD  amLODipine (NORVASC) 5 MG tablet Take 5 mg by mouth daily. 02/18/24  Yes [provider]  aspirin 81 MG tablet Take 81 mg by mouth daily.   Yes [provider]  Biotin 5000 MCG CAPS Take 1 capsule by mouth daily.   Yes [provider]  bumetanide  (BUMEX ) 1 MG tablet 1 tablet  daily. 04/22/18  Yes [provider]  candesartan (ATACAND) 16 MG tablet Take 16 mg by mouth daily. 02/18/24  Yes [provider]  DULoxetine (CYMBALTA) 60 MG capsule 1 capsule daily. 08/09/18  Yes [provider]  famotidine  (PEPCID ) 40 MG tablet TAKE 1 TABLET EVERY 12 HOURS 12/16/23  Yes Barron Vanloan, Gordy HERO, MD  levothyroxine  (SYNTHROID , LEVOTHROID) 50 MCG tablet TAKE 1 TABLET DAILY BEFORE BREAKFAST. 08/14/14  Yes Craig Palma R, PA-C  modafinil (PROVIGIL) 200 MG tablet 1 tablet daily. 05/19/18  Yes [provider]  Moringa Oleifera (MORINGA PO) Take 1  capsule by mouth daily.   Yes [provider]  ondansetron  (ZOFRAN ) 4 MG tablet Take 1 tablet (4 mg total) by mouth every 8 (eight) hours as needed for nausea or vomiting. 09/17/21  Yes Kathlyne Loud, Gordy HERO, MD  PAPAYA ENZYME PO Take by mouth. After meals   Yes [provider]  pravastatin  (PRAVACHOL ) 40 MG tablet TAKE 1 TABLET AT BEDTIME FOR CHOLESTEROL 03/29/15  Yes Tonita Fallow, MD  PROBIOTIC CAPS Take 2 capsules by mouth daily.    Yes [provider]  sucralfate  (CARAFATE ) 1 g tablet TAKE 1 TABLET AT BEDTIME. DO NOT TAKE WITHIN 2 HOURS OF OTHER MEDICATIONS (CALL AND SCHEDULE YOUR COLONOSCOPY) 02/01/24  Yes Kennedy-Smith, Colleen M, NP  diphenhydrAMINE  (BENADRYL ) 50 MG tablet Take one tablet by mouth as directed per CT protocol 11/24/22   Esau Fridman, Gordy HERO, MD  diphenhydrAMINE  HCl (ALLERGY MED PO) Take 1 tablet by mouth daily as needed.    [provider]  Multiple Vitamins-Iron (CHLORELLA PO) Take 5 tablets by mouth daily.    [provider]  ondansetron  (ZOFRAN ) 4 MG tablet TAKE 1 TABLET BY MOUTH EVERY 8 HOURS AS NEEDED FOR NAUSEA AND VOMITING 02/12/24   Kennedy-Smith, Colleen M, NP    Current Outpatient Medications  Medication Sig Dispense Refill   ALPRAZolam  (XANAX ) 0.5 MG tablet TAKE 1 TABLET TWICE DAILY AS NEEDED FOR ANXIETY 30 tablet 0   amLODipine (NORVASC) 5 MG tablet Take 5 mg by mouth daily.     aspirin 81 MG tablet Take 81 mg by mouth daily.     Biotin 5000 MCG CAPS Take 1 capsule by mouth daily.     bumetanide  (BUMEX ) 1 MG tablet 1 tablet daily.     candesartan (ATACAND) 16 MG tablet Take 16 mg by mouth daily.     DULoxetine (CYMBALTA) 60 MG capsule 1 capsule daily.     famotidine  (PEPCID ) 40 MG tablet TAKE 1 TABLET EVERY 12 HOURS 180 tablet 1   levothyroxine  (SYNTHROID , LEVOTHROID) 50 MCG tablet TAKE 1 TABLET DAILY BEFORE BREAKFAST. 90 tablet 1   modafinil (PROVIGIL) 200 MG tablet 1 tablet daily.     Moringa Oleifera (MORINGA PO) Take 1  capsule by mouth daily.     ondansetron  (ZOFRAN ) 4 MG tablet Take 1 tablet (4 mg total) by mouth every 8 (eight) hours as needed for nausea or vomiting. 30 tablet 1   PAPAYA ENZYME PO Take by mouth. After meals     pravastatin  (PRAVACHOL ) 40 MG tablet TAKE 1 TABLET AT BEDTIME FOR CHOLESTEROL 90 tablet 1   PROBIOTIC CAPS Take 2 capsules by mouth daily.      sucralfate  (CARAFATE ) 1 g tablet TAKE 1 TABLET AT BEDTIME. DO NOT TAKE WITHIN 2 HOURS OF OTHER MEDICATIONS (CALL AND SCHEDULE YOUR COLONOSCOPY) 90 tablet 1   diphenhydrAMINE  (BENADRYL ) 50 MG tablet Take one tablet by mouth as directed per CT protocol  1 tablet 0   diphenhydrAMINE  HCl (ALLERGY MED PO) Take 1 tablet by mouth daily as needed.     Multiple Vitamins-Iron (CHLORELLA PO) Take 5 tablets by mouth daily.     ondansetron  (ZOFRAN ) 4 MG tablet TAKE 1 TABLET BY MOUTH EVERY 8 HOURS AS NEEDED FOR NAUSEA AND VOMITING 30 tablet 1   Current Facility-Administered Medications  Medication Dose Route Frequency Provider Last Rate Last Admin   0.9 %  sodium chloride  infusion  500 mL Intravenous Once Daleysa Kristiansen, Gordy HERO, MD        Allergies as of 03/29/2024 - Review Complete 03/29/2024  Allergen Reaction Noted   Cefazolin Anaphylaxis 03/25/2012   Ace inhibitors Cough 04/22/2018   Enalapril Cough 03/01/2013   Quinapril Cough 03/01/2013   Ivp dye [iodinated contrast media] Other (See Comments) 03/25/2012   Penicillin g Rash 04/22/2018   Penicillins  03/25/2012   Trazodone and nefazodone Other (See Comments) 03/01/2013    Family History  Problem Relation Age of Onset   Heart disease Mother    Heart disease Father    Melanoma Brother    Diabetes Maternal Aunt    Stroke Son    Colon cancer Neg Hx    Esophageal cancer Neg Hx    Breast cancer Neg Hx    Rectal cancer Neg Hx    Stomach cancer Neg Hx    Inflammatory bowel disease Neg Hx    Liver disease Neg Hx    Pancreatic cancer Neg Hx     Social History   Socioeconomic History   Marital  status: Married    Spouse name: Debby   Number of children: 1   Years of education: 1-College   Highest education level: Not on file  Occupational History   Occupation: retired  Tobacco Use   Smoking status: Never   Smokeless tobacco: Never  Vaping Use   Vaping status: Never Used  Substance and Sexual Activity   Alcohol  use: Yes    Comment: rarely (1 every year   Drug use: No   Sexual activity: Never  Other Topics Concern   Not on file  Social History Narrative   Lives at home with husband   Patient is right handed.   Patient drinks caffeine occasionally.   Social Drivers of Health   Tobacco Use: Low Risk (03/29/2024)   Patient History    Smoking Tobacco Use: Never    Smokeless Tobacco Use: Never    Passive Exposure: Not on file  Financial Resource Strain: Not on file  Food Insecurity: No Food Insecurity (02/09/2023)   Hunger Vital Sign    Worried About Running Out of Food in the Last Year: Never true    Ran Out of Food in the Last Year: Never true  Transportation Needs: No Transportation Needs (02/09/2023)   PRAPARE - Administrator, Civil Service (Medical): No    Lack of Transportation (Non-Medical): No  Physical Activity: Not on file  Stress: Not on file  Social Connections: Not on file  Intimate Partner Violence: Not At Risk (02/09/2023)   Humiliation, Afraid, Rape, and Kick questionnaire    Fear of Current or Ex-Partner: No    Emotionally Abused: No    Physically Abused: No    Sexually Abused: No  Depression (PHQ2-9): Not on file  Alcohol  Screen: Not on file  Housing: Medium Risk (02/09/2023)   Housing    Last Housing Risk Score: 1  Utilities: Not At Risk (02/09/2023)   AHC Utilities  Threatened with loss of utilities: No  Health Literacy: Not on file    Physical Exam: Vital signs in last 24 hours: @BP  126/86   Pulse 89   Temp (!) 97.3 F (36.3 C) (Temporal)   Ht 5' 1 (1.549 m)   Wt 195 lb (88.5 kg)   SpO2 95%   BMI 36.84 kg/m  GEN:  NAD EYE: Sclerae anicteric ENT: MMM CV: Non-tachycardic Pulm: CTA b/l GI: Soft, NT/ND NEURO:  Alert & Oriented x 3   Gordy Starch, MD [facility]  03/29/2024 10:05 AM  "

## 2024-03-29 NOTE — Progress Notes (Signed)
 Called to room to assist during endoscopic procedure.  Patient ID and intended procedure confirmed with present staff. Received instructions for my participation in the procedure from the performing physician.

## 2024-03-29 NOTE — Patient Instructions (Signed)
 Resume previous diet  Await pathology results  Repeat colonoscopy recommended October 2027 See handouts for polyps and diverticulosis  YOU HAD AN ENDOSCOPIC PROCEDURE TODAY AT THE [facility]:   Refer to the procedure report that was given to you for any specific questions about what was found during the examination.  If the procedure report does not answer your questions, please call your gastroenterologist to clarify.  If you requested that your care partner not be given the details of your procedure findings, then the procedure report has been included in a sealed envelope for you to review at your convenience later.  YOU SHOULD EXPECT: Some feelings of bloating in the abdomen. Passage of more gas than usual.  Walking can help get rid of the air that was put into your GI tract during the procedure and reduce the bloating. If you had a lower endoscopy (such as a colonoscopy or flexible sigmoidoscopy) you may notice spotting of blood in your stool or on the toilet paper. If you underwent a bowel prep for your procedure, you may not have a normal bowel movement for a few days.  Please Note:  You might notice some irritation and congestion in your nose or some drainage.  This is from the oxygen used during your procedure.  There is no need for concern and it should clear up in a day or so.  SYMPTOMS TO REPORT IMMEDIATELY:  Following lower endoscopy (colonoscopy or flexible sigmoidoscopy):  Excessive amounts of blood in the stool  Significant tenderness or worsening of abdominal pains  Swelling of the abdomen that is new, acute  Fever of 100F or higher  Following upper endoscopy (EGD)  Vomiting of blood or coffee ground material  New chest pain or pain under the shoulder blades  Painful or persistently difficult swallowing  New shortness of breath  Fever of 100F or higher  Black, tarry-looking stools  For urgent or emergent issues, a gastroenterologist can be reached at any  hour by calling [phone]. Do not use MyChart messaging for urgent concerns.    DIET:  We do recommend a small meal at first, but then you may proceed to your regular diet.  Drink plenty of fluids but you should avoid alcoholic beverages for 24 hours.  ACTIVITY:  You should plan to take it easy for the rest of today and you should NOT DRIVE or use heavy machinery until tomorrow (because of the sedation medicines used during the test).    FOLLOW UP: Our staff will call the number listed on your records the next business day following your procedure.  We will call around 7:15- 8:00 am to check on you and address any questions or concerns that you may have regarding the information given to you following your procedure. If we do not reach you, we will leave a message.     If any biopsies were taken you will be contacted by phone or by letter within the next 1-3 weeks.  Please call us  at [phone] if you have not heard about the biopsies in 3 weeks.    SIGNATURES/CONFIDENTIALITY: You and/or your care partner have signed paperwork which will be entered into your electronic medical record.  These signatures attest to the fact that that the information above on your After Visit Summary has been reviewed and is understood.  Full responsibility of the confidentiality of this discharge information lies with you and/or your care-partner.

## 2024-03-29 NOTE — Progress Notes (Signed)
 Report given to PACU, vss

## 2024-03-30 ENCOUNTER — Telehealth: Payer: Self-pay | Admitting: Lactation Services

## 2024-03-30 NOTE — Telephone Encounter (Signed)
" °  Follow up Call-     03/29/2024    8:58 AM 11/20/2022    3:01 PM  Call back number  Post procedure Call Back phone  # [phone]  Permission to leave phone message Yes Yes     Patient questions:  Do you have a fever, pain , or abdominal swelling? No. Pain Score  0 *  Have you tolerated food without any problems? Yes.    Have you been able to return to your normal activities? Yes.    Do you have any questions about your discharge instructions: Diet   No. Medications  No. Follow up visit  No.  Do you have questions or concerns about your Care? No.  Actions: * If pain score is 4 or above: No action needed, pain <4.   "

## 2024-03-31 ENCOUNTER — Ambulatory Visit: Payer: Self-pay | Admitting: Internal Medicine

## 2024-03-31 LAB — SURGICAL PATHOLOGY

## 2024-04-11 ENCOUNTER — Other Ambulatory Visit: Payer: Self-pay | Admitting: Nurse Practitioner

## 2024-06-07 ENCOUNTER — Ambulatory Visit: Admitting: Neurology
